# Patient Record
Sex: Female | Born: 1945 | Race: Black or African American | Hispanic: No | Marital: Single | Smoking: Former smoker
Health system: Southern US, Community
[De-identification: ages and names within clinical notes are randomized; demographics above are authoritative.]

## PROBLEM LIST (undated history)

## (undated) DIAGNOSIS — I517 Cardiomegaly: Secondary | ICD-10-CM

## (undated) DIAGNOSIS — I359 Nonrheumatic aortic valve disorder, unspecified: Secondary | ICD-10-CM

## (undated) DIAGNOSIS — I2510 Atherosclerotic heart disease of native coronary artery without angina pectoris: Secondary | ICD-10-CM

## (undated) DIAGNOSIS — K222 Esophageal obstruction: Secondary | ICD-10-CM

## (undated) DIAGNOSIS — IMO0001 Reserved for inherently not codable concepts without codable children: Secondary | ICD-10-CM

## (undated) DIAGNOSIS — I509 Heart failure, unspecified: Secondary | ICD-10-CM

## (undated) DIAGNOSIS — M1990 Unspecified osteoarthritis, unspecified site: Secondary | ICD-10-CM

## (undated) DIAGNOSIS — J309 Allergic rhinitis, unspecified: Secondary | ICD-10-CM

## (undated) DIAGNOSIS — K279 Peptic ulcer, site unspecified, unspecified as acute or chronic, without hemorrhage or perforation: Secondary | ICD-10-CM

## (undated) DIAGNOSIS — J45909 Unspecified asthma, uncomplicated: Secondary | ICD-10-CM

## (undated) DIAGNOSIS — I10 Essential (primary) hypertension: Secondary | ICD-10-CM

## (undated) DIAGNOSIS — R12 Heartburn: Secondary | ICD-10-CM

## (undated) DIAGNOSIS — E785 Hyperlipidemia, unspecified: Secondary | ICD-10-CM

## (undated) DIAGNOSIS — K5730 Diverticulosis of large intestine without perforation or abscess without bleeding: Secondary | ICD-10-CM

## (undated) HISTORY — DX: Peptic ulcer, site unspecified, unspecified as acute or chronic, without hemorrhage or perforation: K27.9

## (undated) HISTORY — DX: Atherosclerotic heart disease of native coronary artery without angina pectoris: I25.10

## (undated) HISTORY — DX: Cardiomegaly: I51.7

## (undated) HISTORY — PX: ESOPHAGOGASTRODUODENOSCOPY: SHX1529

## (undated) HISTORY — DX: Essential (primary) hypertension: I10

## (undated) HISTORY — DX: Heartburn: R12

## (undated) HISTORY — DX: Heart failure, unspecified: I50.9

## (undated) HISTORY — PX: ABDOMINAL HYSTERECTOMY: SHX81

## (undated) HISTORY — DX: Hyperlipidemia, unspecified: E78.5

## (undated) HISTORY — PX: CARDIAC CATHETERIZATION: SHX172

## (undated) HISTORY — DX: Unspecified osteoarthritis, unspecified site: M19.90

## (undated) HISTORY — DX: Diverticulosis of large intestine without perforation or abscess without bleeding: K57.30

## (undated) HISTORY — DX: Esophageal obstruction: K22.2

## (undated) HISTORY — DX: Unspecified asthma, uncomplicated: J45.909

## (undated) HISTORY — DX: Allergic rhinitis, unspecified: J30.9

## (undated) HISTORY — DX: Nonrheumatic aortic valve disorder, unspecified: I35.9

## (undated) HISTORY — DX: Reserved for inherently not codable concepts without codable children: IMO0001

## (undated) HISTORY — PX: ENDOTRACHEAL INTUBATION EMERGENT: SUR448

---

## 1998-03-14 ENCOUNTER — Ambulatory Visit (HOSPITAL_COMMUNITY): Admission: RE | Admit: 1998-03-14 | Discharge: 1998-03-14 | Payer: Self-pay | Admitting: Family Medicine

## 2000-07-22 ENCOUNTER — Encounter: Admission: RE | Admit: 2000-07-22 | Discharge: 2000-07-22 | Payer: Self-pay | Admitting: Family Medicine

## 2000-08-13 ENCOUNTER — Encounter: Admission: RE | Admit: 2000-08-13 | Discharge: 2000-08-13 | Payer: Self-pay | Admitting: Family Medicine

## 2000-09-09 ENCOUNTER — Encounter (INDEPENDENT_AMBULATORY_CARE_PROVIDER_SITE_OTHER): Payer: Self-pay | Admitting: *Deleted

## 2000-09-09 LAB — CONVERTED CEMR LAB

## 2000-09-24 ENCOUNTER — Encounter: Admission: RE | Admit: 2000-09-24 | Discharge: 2000-09-24 | Payer: Self-pay | Admitting: Family Medicine

## 2001-12-15 ENCOUNTER — Encounter: Admission: RE | Admit: 2001-12-15 | Discharge: 2001-12-15 | Payer: Self-pay | Admitting: Sports Medicine

## 2002-01-22 ENCOUNTER — Encounter: Admission: RE | Admit: 2002-01-22 | Discharge: 2002-01-22 | Payer: Self-pay | Admitting: Family Medicine

## 2002-08-28 ENCOUNTER — Emergency Department (HOSPITAL_COMMUNITY): Admission: EM | Admit: 2002-08-28 | Discharge: 2002-08-28 | Payer: Self-pay | Admitting: Emergency Medicine

## 2003-02-23 ENCOUNTER — Encounter: Admission: RE | Admit: 2003-02-23 | Discharge: 2003-02-23 | Payer: Self-pay | Admitting: Sports Medicine

## 2004-03-13 ENCOUNTER — Ambulatory Visit: Payer: Self-pay | Admitting: Family Medicine

## 2004-04-06 ENCOUNTER — Emergency Department (HOSPITAL_COMMUNITY): Admission: EM | Admit: 2004-04-06 | Discharge: 2004-04-06 | Payer: Self-pay | Admitting: Emergency Medicine

## 2005-03-21 ENCOUNTER — Ambulatory Visit: Payer: Self-pay | Admitting: Family Medicine

## 2006-04-08 ENCOUNTER — Ambulatory Visit: Payer: Self-pay | Admitting: Sports Medicine

## 2006-05-06 ENCOUNTER — Ambulatory Visit: Payer: Self-pay | Admitting: Family Medicine

## 2006-08-08 DIAGNOSIS — I10 Essential (primary) hypertension: Secondary | ICD-10-CM

## 2006-08-08 DIAGNOSIS — M1990 Unspecified osteoarthritis, unspecified site: Secondary | ICD-10-CM | POA: Insufficient documentation

## 2006-08-08 DIAGNOSIS — K5730 Diverticulosis of large intestine without perforation or abscess without bleeding: Secondary | ICD-10-CM

## 2006-08-08 DIAGNOSIS — J309 Allergic rhinitis, unspecified: Secondary | ICD-10-CM

## 2006-08-08 DIAGNOSIS — J45909 Unspecified asthma, uncomplicated: Secondary | ICD-10-CM

## 2006-08-08 HISTORY — DX: Allergic rhinitis, unspecified: J30.9

## 2006-08-08 HISTORY — DX: Unspecified asthma, uncomplicated: J45.909

## 2006-08-08 HISTORY — DX: Diverticulosis of large intestine without perforation or abscess without bleeding: K57.30

## 2006-08-08 HISTORY — DX: Essential (primary) hypertension: I10

## 2006-08-09 ENCOUNTER — Encounter (INDEPENDENT_AMBULATORY_CARE_PROVIDER_SITE_OTHER): Payer: Self-pay | Admitting: *Deleted

## 2007-01-28 ENCOUNTER — Telehealth: Payer: Self-pay | Admitting: *Deleted

## 2007-01-28 ENCOUNTER — Emergency Department (HOSPITAL_COMMUNITY): Admission: EM | Admit: 2007-01-28 | Discharge: 2007-01-28 | Payer: Self-pay | Admitting: Emergency Medicine

## 2007-01-29 ENCOUNTER — Encounter (INDEPENDENT_AMBULATORY_CARE_PROVIDER_SITE_OTHER): Payer: Self-pay | Admitting: Family Medicine

## 2007-01-29 ENCOUNTER — Ambulatory Visit: Payer: Self-pay | Admitting: Family Medicine

## 2007-01-29 DIAGNOSIS — R195 Other fecal abnormalities: Secondary | ICD-10-CM | POA: Insufficient documentation

## 2007-01-29 DIAGNOSIS — IMO0001 Reserved for inherently not codable concepts without codable children: Secondary | ICD-10-CM | POA: Insufficient documentation

## 2007-01-29 DIAGNOSIS — R109 Unspecified abdominal pain: Secondary | ICD-10-CM | POA: Insufficient documentation

## 2007-01-29 DIAGNOSIS — I517 Cardiomegaly: Secondary | ICD-10-CM

## 2007-01-29 HISTORY — DX: Reserved for inherently not codable concepts without codable children: IMO0001

## 2007-01-29 HISTORY — DX: Cardiomegaly: I51.7

## 2007-01-30 ENCOUNTER — Ambulatory Visit: Payer: Self-pay | Admitting: Family Medicine

## 2007-01-30 ENCOUNTER — Encounter (INDEPENDENT_AMBULATORY_CARE_PROVIDER_SITE_OTHER): Payer: Self-pay | Admitting: Family Medicine

## 2007-01-30 LAB — CONVERTED CEMR LAB
ALT: 34 units/L (ref 0–35)
AST: 26 units/L (ref 0–37)
Albumin: 4 g/dL (ref 3.5–5.2)
Alkaline Phosphatase: 63 units/L (ref 39–117)
BUN: 19 mg/dL (ref 6–23)
Basophils Absolute: 0.1 10*3/uL (ref 0.0–0.1)
Basophils Relative: 1 % (ref 0–1)
CO2: 26 meq/L (ref 19–32)
Calcium: 9.3 mg/dL (ref 8.4–10.5)
Chloride: 98 meq/L (ref 96–112)
Creatinine, Ser: 0.94 mg/dL (ref 0.40–1.20)
Eosinophils Absolute: 0.1 10*3/uL (ref 0.0–0.7)
Eosinophils Relative: 1 % (ref 0–5)
Glucose, Bld: 94 mg/dL (ref 70–99)
HCT: 38.9 % (ref 36.0–46.0)
Hemoglobin: 12.4 g/dL (ref 12.0–15.0)
Lymphocytes Relative: 21 % (ref 12–46)
Lymphs Abs: 1.7 10*3/uL (ref 0.7–3.3)
MCHC: 31.9 g/dL (ref 30.0–36.0)
MCV: 97.7 fL (ref 78.0–100.0)
Monocytes Absolute: 1 10*3/uL — ABNORMAL HIGH (ref 0.2–0.7)
Monocytes Relative: 12 % — ABNORMAL HIGH (ref 3–11)
Neutro Abs: 5.2 10*3/uL (ref 1.7–7.7)
Neutrophils Relative %: 65 % (ref 43–77)
Platelets: 423 10*3/uL — ABNORMAL HIGH (ref 150–400)
Potassium: 4.4 meq/L (ref 3.5–5.3)
RBC: 3.98 M/uL (ref 3.87–5.11)
RDW: 13.4 % (ref 11.5–14.0)
Sed Rate: 83 mm/hr — ABNORMAL HIGH (ref 0–22)
Sodium: 138 meq/L (ref 135–145)
Total Bilirubin: 0.3 mg/dL (ref 0.3–1.2)
Total CK: 93 units/L (ref 7–177)
Total Protein: 7.2 g/dL (ref 6.0–8.3)
WBC: 7.9 10*3/uL (ref 4.0–10.5)

## 2007-02-05 ENCOUNTER — Ambulatory Visit (HOSPITAL_COMMUNITY): Admission: RE | Admit: 2007-02-05 | Discharge: 2007-02-05 | Payer: Self-pay | Admitting: Sports Medicine

## 2007-02-05 ENCOUNTER — Ambulatory Visit: Payer: Self-pay | Admitting: Internal Medicine

## 2007-02-05 ENCOUNTER — Encounter: Payer: Self-pay | Admitting: Sports Medicine

## 2007-02-06 ENCOUNTER — Encounter (INDEPENDENT_AMBULATORY_CARE_PROVIDER_SITE_OTHER): Payer: Self-pay | Admitting: Family Medicine

## 2007-02-11 ENCOUNTER — Ambulatory Visit: Payer: Self-pay | Admitting: Family Medicine

## 2007-02-13 ENCOUNTER — Encounter (INDEPENDENT_AMBULATORY_CARE_PROVIDER_SITE_OTHER): Payer: Self-pay | Admitting: Family Medicine

## 2007-05-07 LAB — HM MAMMOGRAPHY: HM Mammogram: NORMAL

## 2007-05-15 ENCOUNTER — Encounter (INDEPENDENT_AMBULATORY_CARE_PROVIDER_SITE_OTHER): Payer: Self-pay | Admitting: Family Medicine

## 2008-01-14 ENCOUNTER — Observation Stay (HOSPITAL_COMMUNITY): Admission: EM | Admit: 2008-01-14 | Discharge: 2008-01-16 | Payer: Self-pay | Admitting: Emergency Medicine

## 2008-01-14 ENCOUNTER — Ambulatory Visit: Payer: Self-pay | Admitting: Cardiology

## 2008-01-15 ENCOUNTER — Encounter: Payer: Self-pay | Admitting: Gastroenterology

## 2008-01-16 ENCOUNTER — Encounter: Payer: Self-pay | Admitting: Gastroenterology

## 2008-01-21 ENCOUNTER — Ambulatory Visit: Payer: Self-pay | Admitting: Gastroenterology

## 2008-01-22 ENCOUNTER — Encounter: Payer: Self-pay | Admitting: Gastroenterology

## 2008-02-04 ENCOUNTER — Ambulatory Visit: Payer: Self-pay | Admitting: Cardiology

## 2008-02-18 DIAGNOSIS — E785 Hyperlipidemia, unspecified: Secondary | ICD-10-CM | POA: Insufficient documentation

## 2008-02-18 HISTORY — DX: Hyperlipidemia, unspecified: E78.5

## 2008-02-19 ENCOUNTER — Ambulatory Visit: Payer: Self-pay | Admitting: Gastroenterology

## 2008-02-19 DIAGNOSIS — K222 Esophageal obstruction: Secondary | ICD-10-CM | POA: Insufficient documentation

## 2008-02-19 DIAGNOSIS — R12 Heartburn: Secondary | ICD-10-CM

## 2008-02-19 HISTORY — DX: Heartburn: R12

## 2008-02-19 HISTORY — DX: Esophageal obstruction: K22.2

## 2008-02-23 ENCOUNTER — Ambulatory Visit: Payer: Self-pay | Admitting: Gastroenterology

## 2008-02-23 ENCOUNTER — Encounter: Payer: Self-pay | Admitting: Gastroenterology

## 2008-02-23 LAB — HM COLONOSCOPY

## 2008-02-26 ENCOUNTER — Ambulatory Visit: Payer: Self-pay | Admitting: Cardiology

## 2008-02-26 ENCOUNTER — Encounter: Payer: Self-pay | Admitting: Gastroenterology

## 2008-07-21 ENCOUNTER — Ambulatory Visit: Payer: Self-pay | Admitting: Cardiology

## 2008-07-21 LAB — CONVERTED CEMR LAB
ALT: 16 units/L (ref 0–35)
AST: 19 units/L (ref 0–37)
Albumin: 3.7 g/dL (ref 3.5–5.2)
Alkaline Phosphatase: 51 units/L (ref 39–117)
BUN: 11 mg/dL (ref 6–23)
Bilirubin, Direct: 0.1 mg/dL (ref 0.0–0.3)
CO2: 31 meq/L (ref 19–32)
Calcium: 8.9 mg/dL (ref 8.4–10.5)
Chloride: 106 meq/L (ref 96–112)
Cholesterol: 161 mg/dL (ref 0–200)
Creatinine, Ser: 0.8 mg/dL (ref 0.4–1.2)
GFR calc Af Amer: 94 mL/min
GFR calc non Af Amer: 78 mL/min
Glucose, Bld: 99 mg/dL (ref 70–99)
HDL: 53.9 mg/dL (ref >39.0)
Hgb A1c MFr Bld: 5.8 % (ref 4.6–6.0)
LDL Cholesterol: 94 mg/dL (ref 0–99)
Potassium: 3.8 meq/L (ref 3.5–5.1)
Sodium: 142 meq/L (ref 135–145)
Total Bilirubin: 0.8 mg/dL (ref 0.3–1.2)
Total CHOL/HDL Ratio: 3
Total Protein: 6.6 g/dL (ref 6.0–8.3)
Triglycerides: 66 mg/dL (ref 0–149)
VLDL: 13 mg/dL (ref 0–40)

## 2008-07-26 ENCOUNTER — Ambulatory Visit: Payer: Self-pay | Admitting: Cardiology

## 2008-11-11 ENCOUNTER — Encounter: Admission: RE | Admit: 2008-11-11 | Discharge: 2008-11-11 | Payer: Self-pay | Admitting: Internal Medicine

## 2008-12-21 ENCOUNTER — Ambulatory Visit: Payer: Self-pay | Admitting: Internal Medicine

## 2008-12-21 DIAGNOSIS — M1990 Unspecified osteoarthritis, unspecified site: Secondary | ICD-10-CM | POA: Insufficient documentation

## 2008-12-21 DIAGNOSIS — K279 Peptic ulcer, site unspecified, unspecified as acute or chronic, without hemorrhage or perforation: Secondary | ICD-10-CM

## 2008-12-21 HISTORY — DX: Unspecified osteoarthritis, unspecified site: M19.90

## 2008-12-21 HISTORY — DX: Peptic ulcer, site unspecified, unspecified as acute or chronic, without hemorrhage or perforation: K27.9

## 2009-06-09 ENCOUNTER — Encounter: Payer: Self-pay | Admitting: Internal Medicine

## 2009-06-14 ENCOUNTER — Ambulatory Visit: Payer: Self-pay | Admitting: Internal Medicine

## 2009-08-03 ENCOUNTER — Ambulatory Visit: Payer: Self-pay | Admitting: Cardiology

## 2009-08-03 DIAGNOSIS — I359 Nonrheumatic aortic valve disorder, unspecified: Secondary | ICD-10-CM

## 2009-08-03 HISTORY — DX: Nonrheumatic aortic valve disorder, unspecified: I35.9

## 2009-08-11 ENCOUNTER — Telehealth: Payer: Self-pay | Admitting: Cardiology

## 2009-08-12 ENCOUNTER — Telehealth: Payer: Self-pay | Admitting: Cardiology

## 2009-08-19 ENCOUNTER — Ambulatory Visit (HOSPITAL_COMMUNITY): Admission: RE | Admit: 2009-08-19 | Discharge: 2009-08-19 | Payer: Self-pay | Admitting: Cardiology

## 2009-08-19 ENCOUNTER — Encounter: Payer: Self-pay | Admitting: Cardiology

## 2009-08-19 ENCOUNTER — Ambulatory Visit: Payer: Self-pay | Admitting: Cardiology

## 2009-08-19 ENCOUNTER — Ambulatory Visit: Payer: Self-pay

## 2009-09-01 IMAGING — CT CT ANGIO CHEST
3 of 7 series · 19 of 36 positions shown · IV contrast (APPLIED)
Comparison: None available

CLINICAL DATA: Chest pain

CT ANGIOGRAPHY CHEST
TECHNIQUE: Multidetector CT imaging of the chest using the
standard protocol during bolus administration of intravenous
contrast. Multiplanar reconstructed images obtained and reviewed to
evaluate the vascular anatomy.
Contrast: 80 ml Smnipaque-BOO IV

[Series 5: pulm embolism 3.0 b60f lung · axial · 0.62mm/px · z∈[-240,-130]mm · 5 of 82 slices shown]
[im 8/82  mediastinal]
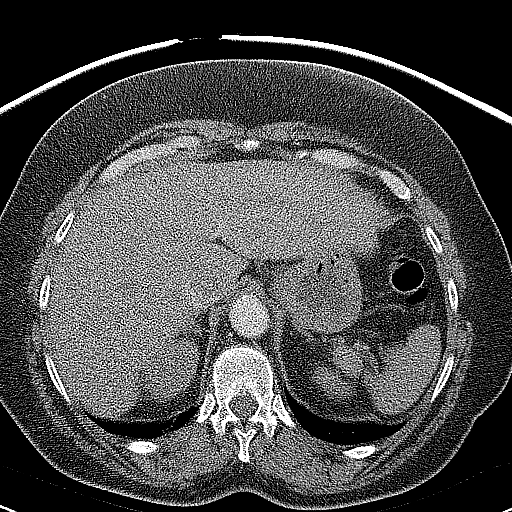
[im 15/82  mediastinal]
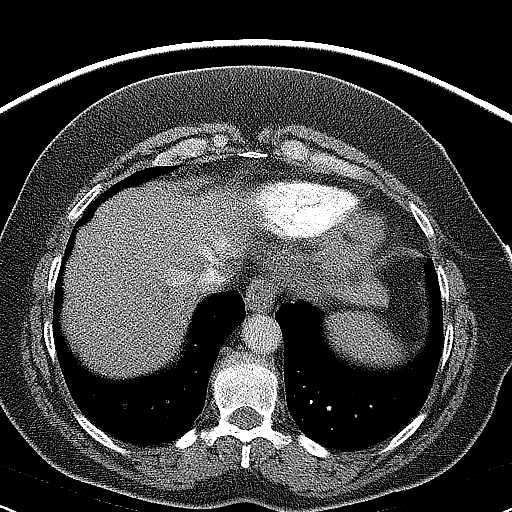
[im 30/82  mediastinal]
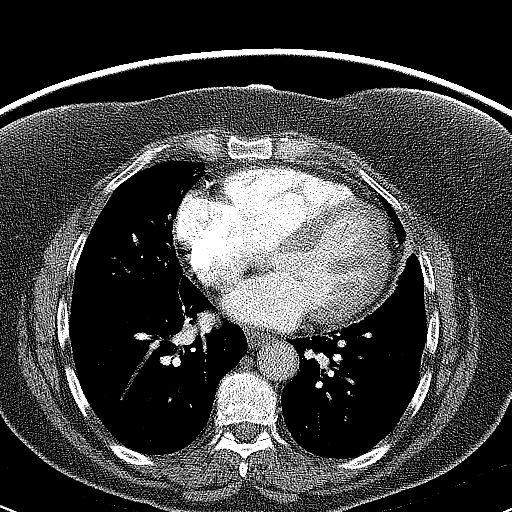
[im 37/82  mediastinal]
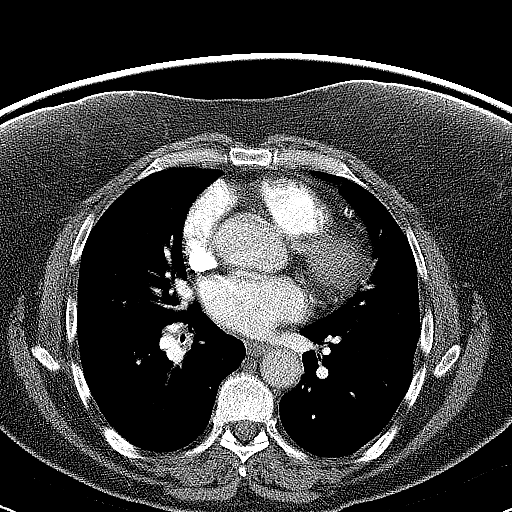
[im 45/82  mediastinal]
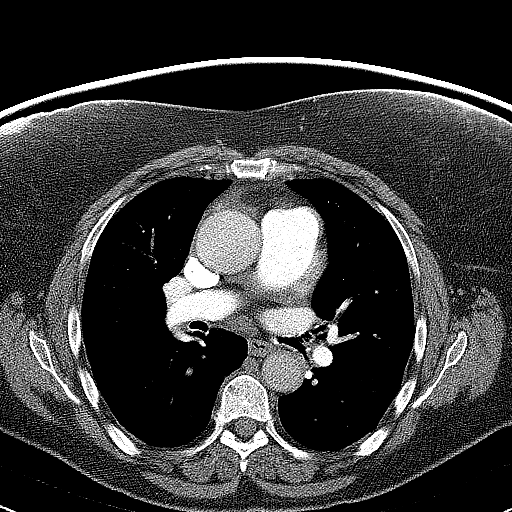

[Series 7: pulm embolism 3.0 b25f thins · axial · 0.62mm/px · z∈[-292,-40]mm · 13 of 99 slices shown]
[im 8/99  lung]
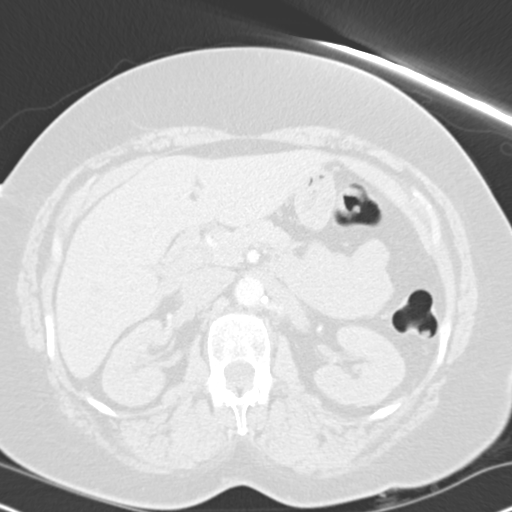
[im 15/99  mediastinal]
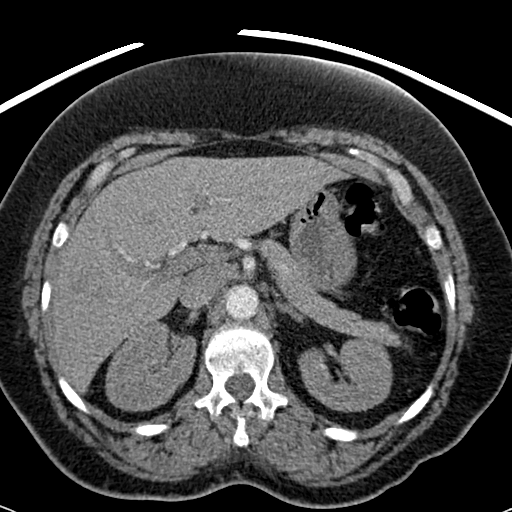
[im 22/99  lung]
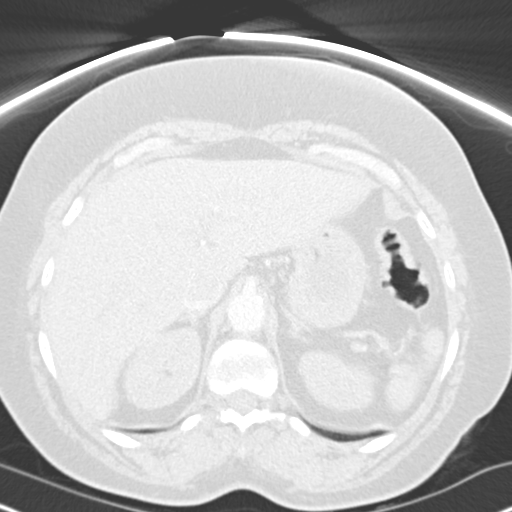
[im 29/99  mediastinal]
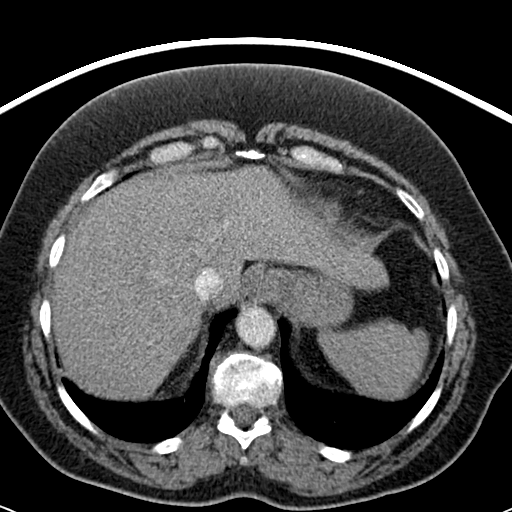
[im 36/99  lung]
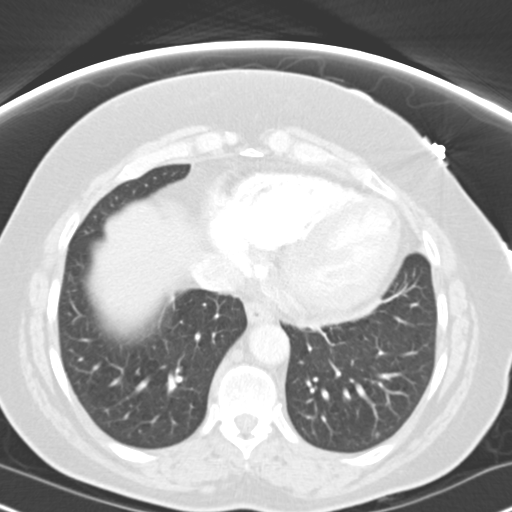
[im 43/99  mediastinal]
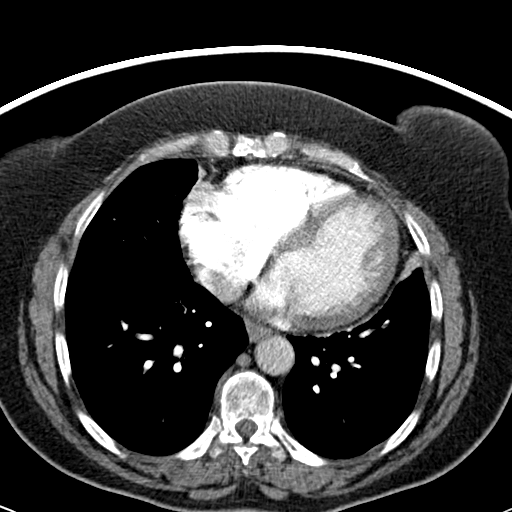
[im 50/99  lung]
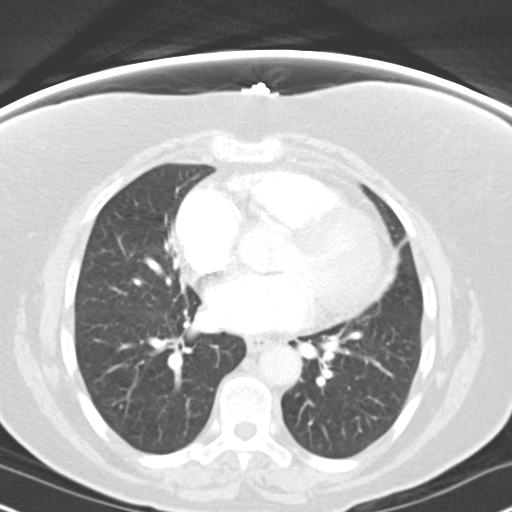
[im 57/99  mediastinal]
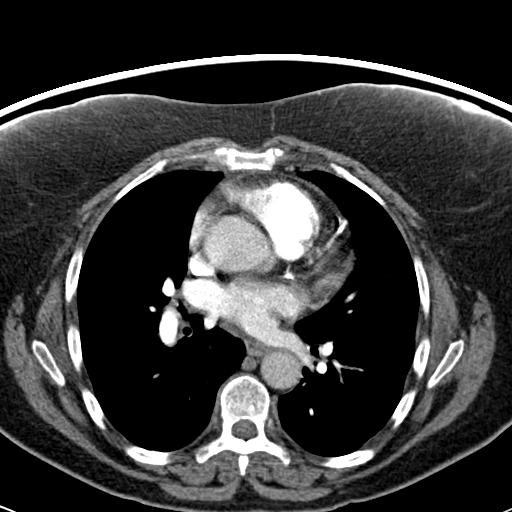
[im 64/99  lung]
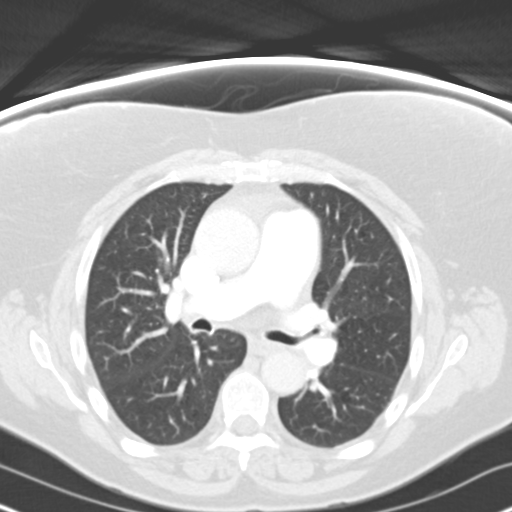
[im 71/99  mediastinal]
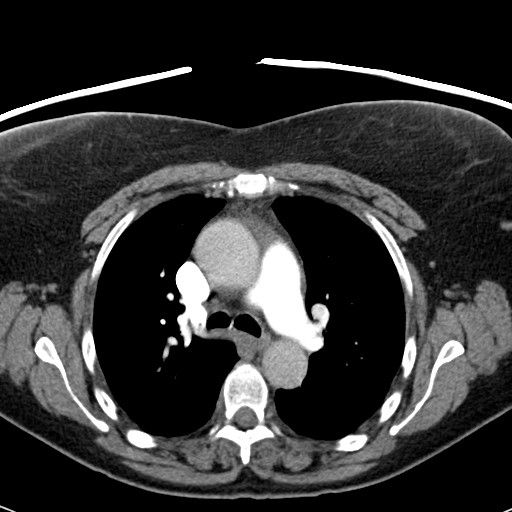
[im 78/99  lung]
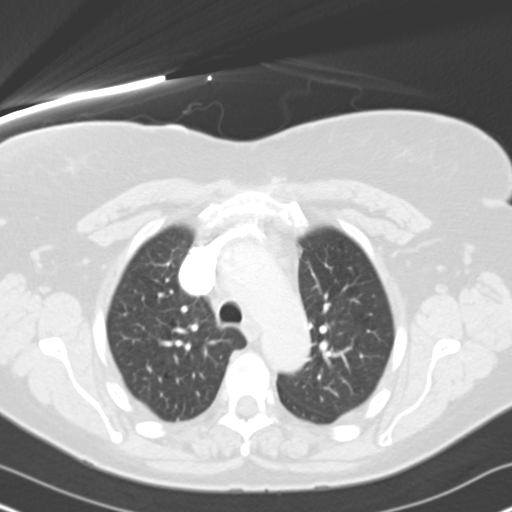
[im 85/99  mediastinal]
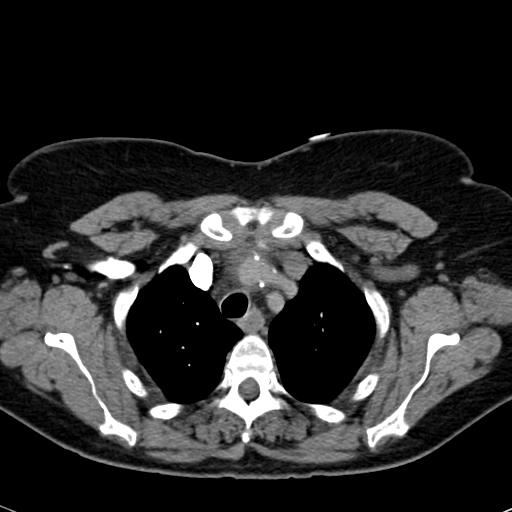
[im 92/99  lung]
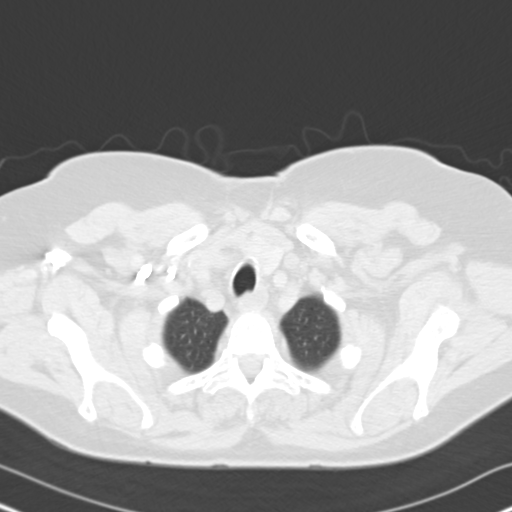

[Series 602: cor angio · coronal · 0.62mm/px · 1 of 101 slices shown]
[im 51/101  mediastinal]
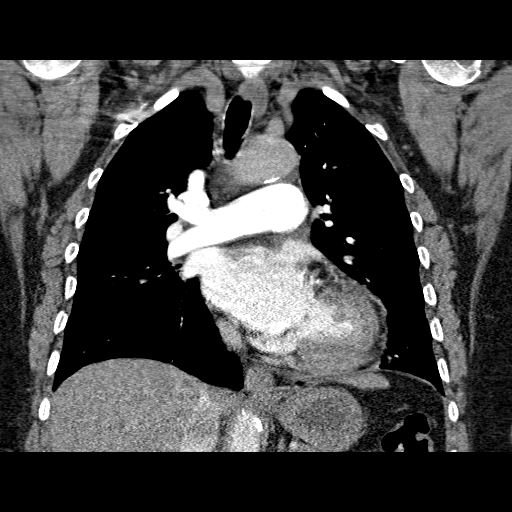

[19 of 36 positions shown; findings below may reference images not displayed]

FINDINGS: There is good contrast opacification of pulmonary artery
branches.  No convincing filling defects to diagnose acute PE.
There is incomplete opacification of thoracic aorta.  Coronary and
aortic calcifications are evident.  No pleural or pericardial
effusion.  No hilar or mediastinal adenopathy.  Visualized upper
abdomen unremarkable.  Lungs are clear.
IMPRESSION: 1.  Negative for acute PE.
2.  Coronary and aortic calcifications.

## 2009-09-16 ENCOUNTER — Telehealth: Payer: Self-pay | Admitting: Cardiology

## 2010-01-17 ENCOUNTER — Telehealth: Payer: Self-pay | Admitting: Internal Medicine

## 2010-02-02 ENCOUNTER — Ambulatory Visit: Payer: Self-pay | Admitting: Internal Medicine

## 2010-02-03 ENCOUNTER — Telehealth: Payer: Self-pay | Admitting: Internal Medicine

## 2010-02-09 ENCOUNTER — Telehealth: Payer: Self-pay | Admitting: Internal Medicine

## 2010-05-11 LAB — HM MAMMOGRAPHY: HM Mammogram: NORMAL

## 2010-06-06 ENCOUNTER — Encounter: Payer: Self-pay | Admitting: Internal Medicine

## 2010-07-09 LAB — CONVERTED CEMR LAB
ALT: 21 units/L (ref 0–35)
AST: 23 units/L (ref 0–37)
Albumin: 3.7 g/dL (ref 3.5–5.2)
Alkaline Phosphatase: 41 units/L (ref 39–117)
Bilirubin, Direct: 0.1 mg/dL (ref 0.0–0.3)
Cholesterol: 159 mg/dL (ref 0–200)
HDL: 66.5 mg/dL (ref >39.00)
LDL Cholesterol: 79 mg/dL (ref 0–99)
Total Bilirubin: 0.6 mg/dL (ref 0.3–1.2)
Total CHOL/HDL Ratio: 2
Total Protein: 7.1 g/dL (ref 6.0–8.3)
Triglycerides: 70 mg/dL (ref 0.0–149.0)
VLDL: 14 mg/dL (ref 0.0–40.0)

## 2010-07-13 ENCOUNTER — Ambulatory Visit (INDEPENDENT_AMBULATORY_CARE_PROVIDER_SITE_OTHER): Payer: Self-pay | Admitting: Cardiology

## 2010-07-13 ENCOUNTER — Ambulatory Visit: Admit: 2010-07-13 | Payer: Self-pay | Admitting: Cardiology

## 2010-07-13 ENCOUNTER — Encounter: Payer: Self-pay | Admitting: Cardiology

## 2010-07-13 DIAGNOSIS — I10 Essential (primary) hypertension: Secondary | ICD-10-CM

## 2010-07-13 DIAGNOSIS — I359 Nonrheumatic aortic valve disorder, unspecified: Secondary | ICD-10-CM

## 2010-07-13 NOTE — Miscellaneous (Signed)
Summary: mammogram-nl  Clinical Lists Changes  Observations: Added new observation of MAMMO DUE: 05/2008 (05/15/2007 13:23) Added new observation of MAMMOGRAM: normal (05/07/2007 13:23)       Preventive Care Screening  Mammogram:    Date:  05/07/2007    Next Due:  05/2008    Results:  normal

## 2010-07-13 NOTE — Progress Notes (Signed)
Summary: LABS TEST RESULTS/ LMOM for CB   Phone Note Call from Patient Call back at Home Phone [phone] Call back at [phone]   Caller: Patient Reason for Call: Lab or Test Results Initial call taken by: Judie Grieve,  August 11, 2009 1:24 PM  Follow-up for Phone Call        [facility] for call back [facility] Katina Dung, RN, BSN  August 12, 2009 10:55 AM  RN Left Message To Call Back. Bernita Raisin, RN, BSN  August 17, 2009 2:50 PM  talked with pt today Follow-up by: Suzan Garibaldi RN

## 2010-07-13 NOTE — Assessment & Plan Note (Signed)
Summary: f1y  Medications Added CALCIUM-VITAMIN D 250-125 MG-UNIT TABS (CALCIUM CARBONATE-VITAMIN D) 1 tab by mouth once daily        Primary Provider:  Dr. Amador Gray  CC:  yearly visit.  History of Present Illness: 65 yo with history of HTN, atypical chest pain, and mild aortic insufficiency returns for followup.  Patient is still working in Public affairs consultant at [employer].  She is doing well in general.  She is active at work.  She gets a little short of breath with climbing a flight of steps but otherwise has no exertional symptoms.  No chest pain.  BP has been running in the 130s to 140s systolic and is 140/79 today.    ECG: NSR, normal  Current Medications (verified): 1)  Benazepril Hcl 20 Mg  Tabs (Benazepril Hcl) .... One Tab By Mouth Qday 2)  Adult Aspirin Ec Low Strength 81 Mg  Tbec (Aspirin) .... One Tab By Mouth Qday 3)  Tramadol Hcl 50 Mg Tabs (Tramadol Hcl) .... Take As Needed 4)  Metoprolol Tartrate 25 Mg Tabs (Metoprolol Tartrate) .Marland Kitchen.. 1 Tab By Mouth Two Times A Day 5)  Zocor 20 Mg Tabs (Simvastatin) .Marland Kitchen.. 1 Tab By Mouth Daily 6)  Amlodipine Besylate 10 Mg Tabs (Amlodipine Besylate) .Marland Kitchen.. 1 Tab By Mouth Daily 7)  Protonix 40 Mg Tbec (Pantoprazole Sodium) .Marland Kitchen.. 1 Once Daily 8)  Calcium-Vitamin D 250-125 Mg-Unit Tabs (Calcium Carbonate-Vitamin D) .Marland Kitchen.. 1 Tab By Mouth Once Daily  Allergies: No Known Drug Allergies  Past History:  Past Medical History: 1. Hypertension. 2. Degenerative joint disease. 3. Echocardiogram, August 2008 which showed mild LVH, EF 65%, mild aortic insufficiency. 4. History of esophageal stricture status post dilatation, August 2009. 5. Peptic ulcer disease. 6. Hypercholesteremia. 7. Atypical chest pain.  Exercise treadmill test in September 2009.  The patient exercised for 7 minutes 30 seconds.   There was no evidence for ischemia by EKG.  8. Asthma 9. Diverticulosis, colon  Family History: Reviewed history from 12/21/2008 and no  changes required. Mother  (78)with DM II.  HTN.  No MI, cancer, lung/GI disease. No FH of Colon Cancer: Father- unknown status  One sister:  died cerebral aneurysm  Social History: Reviewed history from 01/29/2007 and no changes required. Works at [employer].  Never married.  One child.  2 grandchildren, live in [place].  1/2 ppd x 30 yrs.  1-2 beers/ weekend. Quit smoking November 15, 2003.  BF passed away in 11/14/00 .  No reg exercise.  Review of Systems       All systems reviewed and negative except as per HPI.   Vital Signs:  Patient profile:   65 year old female Height:      63 inches Weight:      194 pounds BMI:     34.49 Pulse rate:   55 / minute Resp:     14 per minute BP sitting:   140 / 79  (left arm)  Vitals Entered By: Tanya Gray (August 03, 2009 9:20 AM)  Physical Exam  General:  Well developed, well nourished, in no acute distress. Neck:  Neck supple, JVP 8 cm. No masses, thyromegaly or abnormal cervical nodes. Lungs:  Clear bilaterally to auscultation and percussion. Heart:  Non-displaced PMI, chest non-tender; regular rate and rhythm, S1, S2 without rubs or gallops. 2/6 crescendo-decrescendo murmur RUSB.  Carotid upstroke normal, no bruit. Pedals normal pulses. Trace edema.  Abdomen:  Bowel sounds positive; abdomen soft and non-tender without masses, organomegaly, or  hernias noted. No hepatosplenomegaly. Extremities:  No clubbing or cyanosis. Neurologic:  Alert and oriented x 3. Psych:  Normal affect.   Impression & Recommendations:  Problem # 1:  HYPERTENSION, BENIGN SYSTEMIC (ICD-401.1) BP borderline elevated today.  We will have her record her BP at home and will call her in 2 wks to see what it is running.  Avoid sodium.    Problem # 2:  AORTIC INSUFFICIENCY (ICD-424.1) Aortic-area systolic murmur, no definite diastolic murmur.  Given history of AI on 2008 echo, will repeat echo to assess for progression.  Will need good BP control.    Problem # 3:  HYPERLIPIDEMIA (ICD-272.4) Need fasting lipids/LFTs.   Other Orders: TLB-Lipid Panel (80061-LIPID) TLB-Hepatic/Liver Function Pnl (80076-HEPATIC)  Patient Instructions: 1)  Your physician recommends that you schedule a follow-up appointment in: 1 year with Dr Tanya Gray 2)  Lipids and Liver today--401.9 v58.69 3)  Take and record your blood pressure--I will call you in 2 weeks and  get the readings Tanya Gray [phone]   Appended Document: f1y per Dr Tanya Gray to schedule echocardiogram to follow-up on AI/murmur   Clinical Lists Changes  Orders: Added new Referral order of Echocardiogram (Echo) - Signed

## 2010-07-13 NOTE — Assessment & Plan Note (Signed)
Summary: 6 month fup//ccm   Vital Signs:  Patient profile:   65 year old female Weight:      186 pounds Temp:     98.5 degrees F oral BP sitting:   140 / 90  (left arm) Cuff size:   regular  Vitals Entered By: Kathrynn Speed CMA (February 02, 2010 9:58 AM) CC: 6 mth rov, src Is Patient Diabetic? No   Primary Care Provider:  Dr. Amador Cunas  CC:  6 mth rov and src.  History of Present Illness: 65 year old patient who is seen today for follow-up.  She has a history of mild aortic insufficiency or megaly and hypertension.  She retired.  This past summer and has been out of her medications.  She has been on multiple antihypertensives.  She has a history of dyspepsia, controlled on proton.  No concerns or complaints today.  She has a history of dyslipidemia, controlled on simvastatin..  She continues to tolerate well  Current Medications (verified): 1)  Benazepril Hcl 20 Mg  Tabs (Benazepril Hcl) .... One Tab By Mouth Qday 2)  Adult Aspirin Ec Low Strength 81 Mg  Tbec (Aspirin) .... One Tab By Mouth Qday 3)  Tramadol Hcl 50 Mg Tabs (Tramadol Hcl) .... Take As Needed 4)  Metoprolol Tartrate 25 Mg Tabs (Metoprolol Tartrate) .Marland Kitchen.. 1 Tab By Mouth Two Times A Day 5)  Zocor 20 Mg Tabs (Simvastatin) .Marland Kitchen.. 1 Tab By Mouth Daily 6)  Amlodipine Besylate 10 Mg Tabs (Amlodipine Besylate) .Marland Kitchen.. 1 Tab By Mouth Daily 7)  Protonix 40 Mg Tbec (Pantoprazole Sodium) .Marland Kitchen.. 1 Once Daily 8)  Calcium-Vitamin D 250-125 Mg-Unit Tabs (Calcium Carbonate-Vitamin D) .Marland Kitchen.. 1 Tab By Mouth Once Daily  Allergies (verified): No Known Drug Allergies  Past History:  Past Medical History: Reviewed history from 08/03/2009 and no changes required. 1. Hypertension. 2. Degenerative joint disease. 3. Echocardiogram, August 2008 which showed mild LVH, EF 65%, mild aortic insufficiency. 4. History of esophageal stricture status post dilatation, August 2009. 5. Peptic ulcer disease. 6. Hypercholesteremia. 7. Atypical chest  pain.  Exercise treadmill test in September 2009.  The patient exercised for 7 minutes 30 seconds.   There was no evidence for ischemia by EKG.  8. Asthma 9. Diverticulosis, colon  Family History: Reviewed history from 12/21/2008 and no changes required. Mother  (78)with DM II.  HTN.  No MI, cancer, lung/GI disease. No FH of Colon Cancer: Father- unknown status  One sister:  died cerebral aneurysm  Review of Systems  The patient denies anorexia, fever, weight loss, weight gain, vision loss, decreased hearing, hoarseness, chest pain, syncope, dyspnea on exertion, peripheral edema, prolonged cough, headaches, hemoptysis, abdominal pain, melena, hematochezia, severe indigestion/heartburn, hematuria, incontinence, genital sores, muscle weakness, suspicious skin lesions, transient blindness, difficulty walking, depression, unusual weight change, abnormal bleeding, enlarged lymph nodes, angioedema, and breast masses.    Physical Exam  General:  overweight-appearing.  140/90overweight-appearing.   Head:  Normocephalic and atraumatic without obvious abnormalities. No apparent alopecia or balding. Eyes:  No corneal or conjunctival inflammation noted. EOMI. Perrla. Funduscopic exam benign, without hemorrhages, exudates or papilledema. Vision grossly normal. Mouth:  Oral mucosa and oropharynx without lesions or exudates.  Teeth in good repair. Neck:  No deformities, masses, or tenderness noted. Lungs:  Normal respiratory effort, chest expands symmetrically. Lungs are clear to auscultation, no crackles or wheezes. Heart:  Normal rate and regular rhythm. S1 and S2 normal without gallop, murmur, click, rub or other extra sounds. Abdomen:  Bowel sounds positive,abdomen soft  and non-tender without masses, organomegaly or hernias noted. Msk:  No deformity or scoliosis noted of thoracic or lumbar spine.   Pulses:  R and L carotid,radial,femoral,dorsalis pedis and posterior tibial pulses are full and equal  bilaterally Extremities:  No clubbing, cyanosis, edema, or deformity noted with normal full range of motion of all joints.     Impression & Recommendations:  Problem # 1:  HYPERTENSION, BENIGN SYSTEMIC (ICD-401.1)  The following medications were removed from the medication list:    Amlodipine Besylate 10 Mg Tabs (Amlodipine besylate) .Marland Kitchen... 1 tab by mouth daily Her updated medication list for this problem includes:    Benazepril Hcl 20 Mg Tabs (Benazepril hcl) ..... One tab by mouth qday    Metoprolol Tartrate 25 Mg Tabs (Metoprolol tartrate) .Marland Kitchen... 1 tab by mouth two times a day blood pressure is not doing too poorly off all her medications.  Will discontinue amlodipine.  The following medications were removed from the medication list:    Amlodipine Besylate 10 Mg Tabs (Amlodipine besylate) .Marland Kitchen... 1 tab by mouth daily Her updated medication list for this problem includes:    Benazepril Hcl 20 Mg Tabs (Benazepril hcl) ..... One tab by mouth qday    Metoprolol Tartrate 25 Mg Tabs (Metoprolol tartrate) .Marland Kitchen... 1 tab by mouth two times a day  Problem # 2:  CARDIOMEGALY (ICD-429.3)  Problem # 3:  AORTIC INSUFFICIENCY (ICD-424.1)  Her updated medication list for this problem includes:    Adult Aspirin Ec Low Strength 81 Mg Tbec (Aspirin) ..... One tab by mouth qday    Metoprolol Tartrate 25 Mg Tabs (Metoprolol tartrate) .Marland Kitchen... 1 tab by mouth two times a day  Her updated medication list for this problem includes:    Adult Aspirin Ec Low Strength 81 Mg Tbec (Aspirin) ..... One tab by mouth qday    Metoprolol Tartrate 25 Mg Tabs (Metoprolol tartrate) .Marland Kitchen... 1 tab by mouth two times a day  Problem # 4:  ESOPHAGEAL STRICTURE (ICD-530.3)  Complete Medication List: 1)  Benazepril Hcl 20 Mg Tabs (Benazepril hcl) .... One tab by mouth qday 2)  Adult Aspirin Ec Low Strength 81 Mg Tbec (Aspirin) .... One tab by mouth qday 3)  Tramadol Hcl 50 Mg Tabs (Tramadol hcl) .... Take as needed 4)  Metoprolol  Tartrate 25 Mg Tabs (Metoprolol tartrate) .Marland Kitchen.. 1 tab by mouth two times a day 5)  Calcium-vitamin D 250-125 Mg-unit Tabs (Calcium carbonate-vitamin d) .Marland Kitchen.. 1 tab by mouth once daily 6)  Pantoprazole Sodium 20 Mg Tbec (Pantoprazole sodium) .... One daily 7)  Simvastatin 20 Mg Tabs (Simvastatin) .... One daily  Patient Instructions: 1)  Please schedule a follow-up appointment in 6 months. 2)  Limit your Sodium (Salt) to less than 2 grams a day(slightly less than 1/2 a teaspoon) to prevent fluid retention, swelling, or worsening of symptoms. 3)  It is important that you exercise regularly at least 20 minutes 5 times a week. If you develop chest pain, have severe difficulty breathing, or feel very tired , stop exercising immediately and seek medical attention. 4)  You need to lose weight. Consider a lower calorie diet and regular exercise.  5)  Check your Blood Pressure regularly. If it is above: 140/90 you should make an appointment. Prescriptions: SIMVASTATIN 20 MG TABS (SIMVASTATIN) one daily  #90 x 6   Entered and Authorized by:   Gordy Savers  MD   Signed by:   Gordy Savers  MD on 02/02/2010   Method  used:   Tax adviser to        [facility] (retail)       [phone]       [place], [place]  [address]       Ph: [phone]       Fax: [phone]   RxID:   [id number] PANTOPRAZOLE SODIUM 20 MG TBEC (PANTOPRAZOLE SODIUM) one daily  #90 x 6   Entered and Authorized by:   Gordy Savers  MD   Signed by:   Gordy Savers  MD on 02/02/2010   Method used:   Electronically to        [facility] [address] (retail)       [phone]       [place], [place]  [address]       Ph: [phone]       Fax: [phone]   RxID:   [id number] METOPROLOL TARTRATE 25 MG TABS (METOPROLOL TARTRATE) 1 TAB by mouth two times a day  #180 x 6   Entered and Authorized by:   Gordy Savers   MD   Signed by:   Gordy Savers  MD on 02/02/2010   Method used:   Electronically to        [facility] [address] (retail)       [phone]       [place], [place]  [address]       Ph: [phone]       Fax: [phone]   RxID:   [id number] TRAMADOL HCL 50 MG TABS (TRAMADOL HCL) TAKE AS NEEDED  #90 x 6   Entered and Authorized by:   Gordy Savers  MD   Signed by:   Gordy Savers  MD on 02/02/2010   Method used:   Electronically to        [facility] [address] (retail)       [phone]       [place], [place]  [address]       Ph: [phone]       Fax: [phone]   RxID:   [id number] BENAZEPRIL HCL 20 MG  TABS (BENAZEPRIL HCL) one tab by mouth qday  #90 x 6   Entered and Authorized by:   Gordy Savers  MD   Signed by:   Gordy Savers  MD on 02/02/2010   Method used:   Electronically to        [facility] [address] (retail)       [phone]       [place], [place]  [address]       Ph: [phone]       Fax: [phone]   RxID:   [id number]

## 2010-07-13 NOTE — Progress Notes (Signed)
Summary: question dental work, pre-med   Phone Note Call from Patient Call back at [employer] [phone]   Caller: Patient Reason for Call: Talk to Nurse Summary of Call: pt need dental work, has question regarding pre-med.  Initial call taken by: Lorne Skeens,  September 16, 2009 3:33 PM  Follow-up for Phone Call        Spoke with pt. regarding a need for antibiotics prior having dental work. I let pt. know that according her medical history pt. does not need pre-med prior dental work. Pt. verbalized understanding. Follow-up by: Ollen Gross, RN, BSN,  September 16, 2009 3:57 PM

## 2010-07-13 NOTE — Assessment & Plan Note (Signed)
Summary: 6 month rov/njr   Vital Signs:  Patient profile:   65 year old female Weight:      192 pounds BMI:     34.13 Temp:     98.8 degrees F BP sitting:   150 / 80  (left arm) Cuff size:   regular  Vitals Entered By: Raechel Ache, RN (June 14, 2009 10:10 AM) CC: 6 mo ROV   Primary Care Provider:  Andi Devon MD  CC:  6 mo ROV.  History of Present Illness: 65 -year-old patient who is seen today for follow-up.  She has treated hypertension and dyslipidemia.  She is followed by cardiology and is scheduled for lab and office visit next month.  She has osteoarthritis which has been stable.  She has a history of reflux and esophageal stricture, which has been stable.  She denies any cardiopulmonary complaints  Allergies: No Known Drug Allergies  Past History:  Past Medical History: Reviewed history from 12/21/2008 and no changes required. G2P2, 1 boy died in sleep age 34 mths., h/o combivent/flovent use in past, h/o Etoh abuse per [facility] diatolic dysfunction Arthritis Hyperlipidemia Hypertension atypical chest pain Osteoarthritis esophageal stricture Asthma Diverticulosis, colon Peptic ulcer disease  Review of Systems  The patient denies anorexia, fever, weight loss, weight gain, vision loss, decreased hearing, hoarseness, chest pain, syncope, dyspnea on exertion, peripheral edema, prolonged cough, headaches, hemoptysis, abdominal pain, melena, hematochezia, severe indigestion/heartburn, hematuria, incontinence, genital sores, muscle weakness, suspicious skin lesions, transient blindness, difficulty walking, depression, unusual weight change, abnormal bleeding, enlarged lymph nodes, angioedema, and breast masses.    Physical Exam  General:  overweight-appearing.  130/80overweight-appearing.   Head:  Normocephalic and atraumatic without obvious abnormalities. No apparent alopecia or balding. Eyes:  No corneal or conjunctival inflammation noted. EOMI. Perrla.  Funduscopic exam benign, without hemorrhages, exudates or papilledema. Vision grossly normal. Mouth:  Oral mucosa and oropharynx without lesions or exudates.  Teeth in good repair. Neck:  No deformities, masses, or tenderness noted. Lungs:  Normal respiratory effort, chest expands symmetrically. Lungs are clear to auscultation, no crackles or wheezes. Heart:  Normal rate and regular rhythm. S1 and S2 normal without gallop, murmur, click, rub or other extra sounds. Abdomen:  Bowel sounds positive,abdomen soft and non-tender without masses, organomegaly or hernias noted. Msk:  No deformity or scoliosis noted of thoracic or lumbar spine.   Pulses:  R and L carotid,radial,femoral,dorsalis pedis and posterior tibial pulses are full and equal bilaterally Extremities:  No clubbing, cyanosis, edema, or deformity noted with normal full range of motion of all joints.     Impression & Recommendations:  Problem # 1:  HYPERLIPIDEMIA (ICD-272.4)  Her updated medication list for this problem includes:    Zocor 20 Mg Tabs (Simvastatin) .Marland Kitchen... 1 tab by mouth daily  Her updated medication list for this problem includes:    Zocor 20 Mg Tabs (Simvastatin) .Marland Kitchen... 1 tab by mouth daily  Problem # 2:  HYPERTENSION, BENIGN SYSTEMIC (ICD-401.1)  The following medications were removed from the medication list:    Amlodipine Besylate 10 Mg Tabs (Amlodipine besylate) .Marland Kitchen... 1 once daily Her updated medication list for this problem includes:    Benazepril Hcl 20 Mg Tabs (Benazepril hcl) ..... One tab by mouth qday    Metoprolol Tartrate 25 Mg Tabs (Metoprolol tartrate) .Marland Kitchen... 1 tab by mouth two times a day    Amlodipine Besylate 10 Mg Tabs (Amlodipine besylate) .Marland Kitchen... 1 tab by mouth daily  The following medications were removed  from the medication list:    Amlodipine Besylate 10 Mg Tabs (Amlodipine besylate) .Marland Kitchen... 1 once daily Her updated medication list for this problem includes:    Benazepril Hcl 20 Mg Tabs  (Benazepril hcl) ..... One tab by mouth qday    Metoprolol Tartrate 25 Mg Tabs (Metoprolol tartrate) .Marland Kitchen... 1 tab by mouth two times a day    Amlodipine Besylate 10 Mg Tabs (Amlodipine besylate) .Marland Kitchen... 1 tab by mouth daily  Complete Medication List: 1)  Benazepril Hcl 20 Mg Tabs (Benazepril hcl) .... One tab by mouth qday 2)  Adult Aspirin Ec Low Strength 81 Mg Tbec (Aspirin) .... One tab by mouth qday 3)  Tramadol Hcl 50 Mg Tabs (Tramadol hcl) .... Take as needed 4)  Metoprolol Tartrate 25 Mg Tabs (Metoprolol tartrate) .Marland Kitchen.. 1 tab by mouth two times a day 5)  Zocor 20 Mg Tabs (Simvastatin) .Marland Kitchen.. 1 tab by mouth daily 6)  Amlodipine Besylate 10 Mg Tabs (Amlodipine besylate) .Marland Kitchen.. 1 tab by mouth daily 7)  Protonix 40 Mg Tbec (Pantoprazole sodium) .Marland Kitchen.. 1 once daily  Patient Instructions: 1)  Please schedule a follow-up appointment in 6 months. 2)  Limit your Sodium (Salt) to less than 2 grams a day(slightly less than 1/2 a teaspoon) to prevent fluid retention, swelling, or worsening of symptoms. 3)  It is important that you exercise regularly at least 20 minutes 5 times a week. If you develop chest pain, have severe difficulty breathing, or feel very tired , stop exercising immediately and seek medical attention. 4)  Take calcium +Vitamin D daily. 5)  Check your Blood Pressure regularly. If it is above: 150/90  you should make an appointment.

## 2010-07-13 NOTE — Procedures (Signed)
Summary: Colonoscopy   Colonoscopy  Procedure date:  02/23/2008  Findings:      Location:  [facility].    Procedures Next Due Date:    Colonoscopy: 02/2013  Patient Name: Tanya Gray, Tanya Gray MRN:  Procedure Procedures: Colonoscopy CPT: 11914.    with Hot Biopsy(s)CPT: Z451292.    with polypectomy. CPT: A3573898.  Personnel: Endoscopist: Barbette Hair. Arlyce Dice, MD.  Exam Location: Outpatient  Patient Consent: Procedure, Alternatives, Risks and Benefits discussed, consent obtained, from patient.  Indications  Average Risk Screening Routine.  History  Current Medications: Patient is on an anticoagulant. Patient is not currently taking Coumadin. Anticoagulants: Heparin  Pre-Exam Physical: Performed Feb 23, 2008. Cardio-pulmonary exam, HEENT exam , Abdominal exam, Mental status exam WNL.  Comments: Patient history reviewed/updated, physical performed prior to initiation of sedation?yes Exam Exam: Extent of exam reached: Ileum, extent intended: Cecum.  The cecum was identified by appendiceal orifice and IC valve. Time to Cecum: 00:06: 46. Time for Withdrawl: 00:09:10. Colon retroflexion performed. ASA Classification: II. Tolerance: good.  Monitoring: Pulse and BP monitoring, Oximetry used. Supplemental O2 given. at 2 Liters.  Colon Prep Used Moviprep for colon prep. Prep results: excellent.  Sedation Meds: Patient assessed and found to be appropriate for moderate (conscious) sedation. Sedation was managed by the Endoscopist. Fentanyl 50 mcg. given IV. Versed 6 mg. given IV.  Findings POLYP: Splenic Flexure, Maximum size: 4 mm. sessile polyp. Procedure:  snare with cautery, Polyp sent to pathology. ICD9: Colon Polyps: 211.3.  - NORMAL EXAM: Cecum to Splenic Flexure.  NORMAL EXAM: Cecum.  POLYP: Sigmoid Colon, Maximum size: 2 mm. sessile polyp. Distance from Anus 20 cm. Procedure:  hot biopsy, sent to pathology. ICD9: Colon Polyps: 211.3.  -  DIVERTICULOSIS: Sigmoid Colon. ICD9: Diverticulosis: 562.10. Comments: Few diverticula.  POLYP: Sigmoid Colon, Maximum size: 5 mm. sessile polyp. Distance from Anus 12 cm. Procedure:  snare with cautery, sent to pathology. ICD9: Colon Polyps: 211.3.  NORMAL EXAM: Rectum.   Assessment Abnormal examination, see findings above.  Diagnoses: 562.10: Diverticulosis.  211.3: Colon Polyps.   Events  Unplanned Interventions: No intervention was required.  Unplanned Events: There were no complications. Plans  Post Exam Instructions: Post sedation instructions given.  Patient Education: Patient given standard instructions for: Polyps. Diverticulosis.  Disposition: After procedure patient sent to recovery. After recovery patient sent home.  Scheduling/Referral: Colonoscopy, to Barbette Hair. Arlyce Dice, MD, if adenoma, around Feb 22, 2013. This report was created from the original endoscopy report, which was reviewed and signed by the above listed endoscopist.    REPORT OF SURGICAL PATHOLOGY   Case #: [id number] Patient Name: Tanya Gray Office Chart Number:  [id number]   MRN: [id number] Pathologist: Alden Server A. Delila Spence, MD DOB/Age  05/15/1946 (Age: 65)    Gender: F Date Taken:  02/23/2008 Date Received: 02/24/2008   FINAL DIAGNOSIS   ***MICROSCOPIC EXAMINATION AND DIAGNOSIS***   1. COLON, TRANSVERSE, POLYP:   -  ONE ADENOMATOUS POLYP AND ONE HYPERPLASTIC POLYP. -  NO HIGH GRADE DYSPLASIA OR INVASIVE MALIGNANCY IDENTIFIED.   2. RECTUM, POLYP: -  POLYPOID RECTAL MUCOSA WITH UNDERLYING LEIOMYOMA. -  NO ADENOMATOUS CHANGE OR MALIGNANCY IDENTIFIED.   jy Date Reported:  02/25/2008     Alden Server A. Delila Spence, MD *** Electronically Signed Out By EAA ***     February 26, 2008 MRN: [id number]  [facility] [address] [place], [place]  [address]    Dear Ms. Whittenberg,  I am pleased to inform you that the colon polyp(s) removed  during your recent colonoscopy was (were) found  to be benign (no cancer detected) upon pathologic examination.  I recommend you have a repeat colonoscopy examination in _5 years to look for recurrent polyps, as having colon polyps increases your risk for having recurrent polyps or even colon cancer in the future.  Should you develop new or worsening symptoms of abdominal pain, bowel habit changes or bleeding from the rectum or bowels, please schedule an evaluation with either your primary care physician or with me.  Additional information/recommendations:  __ No further action with gastroenterology is needed at this time. Please      follow-up with your primary care physician for your other healthcare      needs.  __ Please call [phone] to schedule a return visit to review your      situation.  __ Please keep your follow-up visit as already scheduled.  _x_ Continue treatment plan as outlined the day of your exam.  Please call us if you are having persistent problems or have questions about your condition that have not been fully answered at this time.  Sincerely,  Louis Meckel MD  This letter has been electronically signed by your physician.   Signed by Louis Meckel MD on 02/26/2008 at 2:36 PM  ________________________________________________________________________   cc:  Andi Devon, MD

## 2010-07-13 NOTE — Assessment & Plan Note (Signed)
Summary: F-UP HOSP-ENDO/FH   History of Present Illness Visit Type: new patient Primary GI MD: Melvia Heaps MD [facility] Primary Provider: Andi Devon MD Chief Complaint: hospital f/u Endo no current GI problems History of Present Illness:   Ms. Chakraborty comes in today for a hospital follow-up. In August she was hospitalized with chest pain.  She underwent an EGD for chest pain as well as dysphagia.  Some antral ulcers were seen, one of which had a pigmented base but no active bleeding.  Biopsies were compatible with reactive gastropathy. No H. Pylori organisms were seen.  The patient also had a distal esophageal stricture which was dilated.  She is currently on Protonix and hasn't had any further chest pain or dysphagia. No GI complaints including diarrhea, constipation or hematochezia.  Her last colonoscopy was apparently 10 years ago (per patient).   GI Review of Systems      Denies abdominal pain, acid reflux, belching, bloating, chest pain, dysphagia with liquids, dysphagia with solids, heartburn, loss of appetite, nausea, vomiting, vomiting blood, weight loss, and  weight gain.        Denies anal fissure, black tarry stools, change in bowel habit, constipation, diarrhea, diverticulosis, fecal incontinence, heme positive stool, hemorrhoids, irritable bowel syndrome, jaundice, light color stool, liver problems, rectal bleeding, and  rectal pain.     Prior Medications Reviewed Using: Patient Recall  Updated Prior Medication List: BENAZEPRIL HCL 20 MG  TABS (BENAZEPRIL HCL) one tab by mouth qday AMLODIPINE BESYLATE 10 MG TABS (AMLODIPINE BESYLATE) 1 once daily ADULT ASPIRIN EC LOW STRENGTH 81 MG  TBEC (ASPIRIN) one tab by mouth qday TRAMADOL HCL 50 MG TABS (TRAMADOL HCL) TAKE AS NEEDED METOPROLOL TARTRATE 25 MG TABS (METOPROLOL TARTRATE) 1 TAB by mouth two times a day ZOCOR 20 MG TABS (SIMVASTATIN) 1 TAB by mouth DAILY AMLODIPINE BESYLATE 10 MG TABS (AMLODIPINE BESYLATE) 1 TAB by  mouth DAILY PROTONIX 40 MG TBEC (PANTOPRAZOLE SODIUM) 1 once daily  Current Allergies (reviewed today): No known allergies   Past Medical History:    G2P2, 1 boy died in sleep age 65 mths., h/o combivent/flovent use in past, h/o Etoh abuse per [facility]    diatolic dysfunction    Arthritis    Hyperlipidemia    Hypertension  Past Surgical History:    Reviewed history from 08/08/2006 and no changes required:       hysterectom, age 35, 2/2 irreg bleeding - 07/30/2000   Family History:    Mother with DM II.  HTN.  No MI, cancer, lung/GI disease.    No FH of Colon Cancer:  Social History:    Reviewed history from 01/29/2007 and no changes required:       Works at [employer], [employer].  Never married.  One child.  2 grandchildren, live in [place].  1/2 ppd x 30 yrs.  1-2 beers/ weekend. Quit smoking 2003-11-11.  BF passed away in 2000/11/10 .  No reg exercise.    Review of Systems  The patient denies allergy/sinus, anemia, anxiety-new, arthritis/joint pain, back pain, blood in urine, breast changes/lumps, change in vision, confusion, cough, coughing up blood, depression-new, fainting, fatigue, fever, headaches-new, hearing problems, heart murmur, heart rhythm changes, itching, menstrual pain, muscle pains/cramps, night sweats, nosebleeds, pregnancy symptoms, shortness of breath, skin rash, sleeping problems, sore throat, swelling of feet/legs, swollen lymph glands, thirst - excessive , urination - excessive , urination changes/pain, urine leakage, vision changes, and voice change.     Vital Signs:  Patient Profile:  65 Years Old Female Height:     63 inches Weight:      187.50 pounds BMI:     33.33 Pulse rate:   66 / minute Pulse rhythm:   regular BP sitting:   118 / 70  (left arm)  Vitals Entered By: Chales Abrahams CMA (February 19, 2008 8:24 AM)                  Physical Exam  General:     Well developed, well nourished, no acute distress. Head:     Normocephalic  and atraumatic. Mouth:     No deformity or lesions, dentition normal. Neck:     Supple; no masses or thyromegaly. Lungs:     Clear throughout to auscultation. Heart:     Regular rate and rhythm; no murmurs, rubs,  or bruits. Abdomen:     Soft, nontender, nondistended. Normal BS. No hepatomegaly. Extremities:     trace bilateral lower extremity edema. Neurologic:     Alert and  oriented x4;  grossly normal neurologically. Skin:     Intact without significant lesions or rashes. Psych:     Alert and cooperative. Normal mood and affect.    Impression & Recommendations:  Problem # 1:  ESOPHAGEAL STRICTURE (ICD-530.3) S/P dilation August 2009. No further dysphagia. Continue Protonix  Problem # 2:  HEARTBURN (ICD-787.1) Resolved on Protonix. No esophagitis on recent EGD. Continue Protonix 40mg  once daily. Patient does have an appt. for a stress test on the 17th of this month to rule out cardiac disease.  Problem # 3:  SCREENING COLORECTAL-CANCER (ICD-V76.51) Per patient, her last colonoscopy was 10 years ago. Will schedule colonoscopy for routine screening.  Other Orders: Colonoscopy (Colon)   Patient Instructions: 1)  colonoscopy in [facility] 02/23/2008 2)  continue Protonix 3)  Movie prep sent to your pharmacy 4)  [facility] Patient Information Guide given to patient.    Prescriptions: MOVIPREP 100 GM  SOLR (PEG-KCL-NACL-NASULF-NA ASC-C) As per prep instructions.  #1 x 0   Entered by:   Merri Ray CMA   Authorized by:   Louis Meckel MD   Signed by:   Merri Ray CMA on 02/19/2008   Method used:   Print then Give to Patient   RxID:   [id number]  ]

## 2010-07-13 NOTE — Miscellaneous (Signed)
   Clinical Lists Changes  Observations: Added new observation of ECHOFINDING: diastolic dysfunction with EF 65% otherwise normalEjection Fraction:  >=50%.  Location:  [facility].    (02/06/2007 11:21)       Echocardiogram  Procedure date:  02/06/2007  Findings:      diastolic dysfunction with EF 65% otherwise normalEjection Fraction:  >=50%.  Location:  [facility].

## 2010-07-13 NOTE — Progress Notes (Signed)
Summary: need directions on tramadol  Phone Note From Pharmacy   Caller: [facility] Summary of Call: need sig on tramadol - uad not enough.     New/Updated Medications: TRAMADOL HCL 50 MG TABS (TRAMADOL HCL) two times a day prn Prescriptions: TRAMADOL HCL 50 MG TABS (TRAMADOL HCL) two times a day prn  #90 x 6   Entered by:   Duard Brady LPN   Authorized by:   Gordy Savers  MD   Signed by:   Duard Brady LPN on 62/83/1517   Method used:   Electronically to        [facility] [phone]* (retail)       [address]       [place], [place]  [address]       Ph: [phone]       Fax: [phone]   RxID:   [id number]

## 2010-07-13 NOTE — Procedures (Signed)
Summary: Endoscopy   EGD  Procedure date:  01/15/2008  Findings:      Location: [facility]    Patient Name: Tanya Gray, Tanya Gray MRN:  Procedure Procedures: Panendoscopy (EGD) CPT: 43235.    with biopsy(s)/brushing(s). CPT: D1846139.  Personnel: Endoscopist: Barbette Hair. Arlyce Dice, MD.  Indications Symptoms: Dysphagia. Chest Pain.  History  Current Medications: Patient is on an anticoagulant. Patient is not currently taking Coumadin. Anticoagulants: Heparin  Pre-Exam Physical: Performed Jan 15, 2008  Entire physical exam was normal.  Exam Exam Info: Maximum depth of insertion Duodenum, intended Duodenum. Vocal cords visualized. Gastric retroflexion performed. ASA Classification: II. Tolerance: good.  Sedation Meds: Robinul 0.2 given IV. Fentanyl 60 given IV. Versed 6 mg. given IV. Cetacaine Spray 2 sprays given aerosolized.  Monitoring: BP and pulse monitoring done. Oximetry used. Supplemental O2 given at 2 Liters.  Findings - Normal: Proximal Esophagus to Mid Esophagus.  STRICTURE / STENOSIS: Stricture in Distal Esophagus.  Constriction: partial. 40 cm from mouth. ICD9: Esophageal Stricture: 530.3.  - Normal: Duodenal Bulb to Duodenal 2nd Portion.  ULCER: in Antrum Maximum size: 3 mm. Not bleeding, dark spot in ulcer. Biopsy/Ulcer taken. ICD9: Ulcer, Gastric, Acute with Hemorrhage: 532. 00. Comment: 3 discreet superficial antral ulcers, one with pigment at its base.  - Normal: Cardia to Body.  ULCER: in Antrum Not bleeding, dark spot in ulcer. taken. ICD9: Ulcer, Gastric, Acute with Hemorrhage: 532.00.  ULCER: in Antrum Not bleeding, dark spot in ulcer. taken. ICD9: Ulcer, Gastric, Acute with Hemorrhage: 532.00.   Assessment Abnormal examination, see findings above.  Diagnoses: 530.3: Esophageal Stricture.  532.00: Ulcer, Gastric, Acute with Hemorrhage.   Events  Unplanned Intervention: No unplanned interventions were required.  Unplanned Events: There  were no complications. Plans Medication(s): PPI: Pantoprazole/Protonix 40 mg QD, starting Jan 15, 2008   Comments: Stricture was not dilated because pt received 3 doses of subcutaneous heparin Scheduling: EGD, to Molly Maduro D. Arlyce Dice, MD, Balloon dilitation on Jan 16, 2008.    cc: Candyce Churn. Allyne Gee, MD   This report was created from the original endoscopy report, which was reviewed and signed by the above listed endoscopist.

## 2010-07-13 NOTE — Progress Notes (Signed)
Summary: needs office visit   Phone Note From Other Clinic   Caller: Provider Summary of Call: pt seen at [facility] on 8/19 for fatigue, soreness in back and shoulders.  no sob or chest pain.  not feeling like herself.  pt is a Advertising copywriter.  has appt scheduled for 9/19 with pcp, but needing more acute visit.  urine normal  cxr showed cardiac enlargement.  Clydie Braun, [place], wants her to be seen sooner.  please schedule as wi appt for 8/20.  pt to call at 830 but would like a call from triage nurse.  [phone] pt's number.   Initial call taken by: Wilhemina Bonito  MD,  January 28, 2007 8:27 PM  Follow-up for Phone Call        appointment scheduled today at 3:10 with PCP.  Follow-up by: Theresia Lo RN,  January 29, 2007 8:33 AM

## 2010-07-13 NOTE — Assessment & Plan Note (Signed)
Summary: F/U RESULTS OF HEMOCULT CARDS/[facility]  Medications Added BENAZEPRIL HCL 20 MG  TABS (BENAZEPRIL HCL) one tab by mouth qday VERAPAMIL HCL CR 360 MG CP24 (VERAPAMIL HCL) Take 1 capsule by mouth once a day ADULT ASPIRIN EC LOW STRENGTH 81 MG  TBEC (ASPIRIN) one tab by mouth qday        Vital Signs:  Patient Profile:   65 Years Old Female Height:     63 inches Weight:      191 pounds Temp:     97.8 degrees F Pulse rate:   61 / minute BP sitting:   143 / 79  Pt. in pain?   no  Vitals Entered By: Jone Baseman CMA (February 11, 2007 8:20 AM)                  Chief Complaint:  f/u hemocult cards.  History of Present Illness: S: Patient here today to f/u last office visit where she was having fever and body aches.  I got blood cultures because she had a heart murmur and these had no growth.  ECHO showed diastolic dysfunction with EF 65% and otherwise normal.  Patient states she feels much better and has not had any fever or chills.  2. HTN- Patient has not taken BP meds today.  She has bought a monitor for home but has not used it yet.  Patient thinks that she is getting cramping from the HCTZ    Past Medical History:    Reviewed history from 08/08/2006 and no changes required:       G2P2, 1 boy died in sleep age 84 mths., h/o combivent/flovent use in past, h/o Etoh abuse per [facility]       diatolic dysfunction  Past Surgical History:    Reviewed history from 08/08/2006 and no changes required:       hysterectom, age 34, 2/2 irreg bleeding - 07/30/2000   Family History:    Reviewed history from 08/08/2006 and no changes required:       Mother with DM II.  HTN.  No MI, cancer, lung/GI disease.  Social History:    Reviewed history from 01/29/2007 and no changes required:       Works at [employer], [employer].  Never married.  One child.  2 grandchildren, live in [place].  1/2 ppd x 30 yrs.  1-2 beers/ weekend. Quit smoking 11-17-03.  BF passed away in 11/16/00  .  No reg exercise.     Physical Exam  General:     Well-developed,well-nourished,in no acute distress; alert,appropriate and cooperative throughout examination Head:     normocephalic and atraumatic.   Eyes:     vision grossly intact.   Mouth:     Oral mucosa and oropharynx without lesions or exudates.  Teeth in good repair. Lungs:     Normal respiratory effort, chest expands symmetrically. Lungs are clear to auscultation, no crackles or wheezes. Heart:     Normal rate and regular rhythm. S1 and S2 normal, soft II/VI systolic ejection murmor. Extremities:     No clubbing, cyanosis, edema, or deformity noted with normal full range of motion of all joints.      Impression & Recommendations:  Problem # 1:  ABDOMINAL PAIN (ICD-789.00) Assessment: Improved This has resolved.  As noted, the patient had also had fever and myalgias which have resolved as well.  Will recheck ESR today since that was elevated at last visit.  Patient will return stool cards tomorrow  Problem # 2:  HYPERTENSION, BENIGN SYSTEMIC (ICD-401.1) Assessment: Deteriorated Slightly elevated.  Will stop HCTZ and increase Benazepril.   Her updated medication list for this problem includes:    Benazepril Hcl 20 Mg Tabs (Benazepril hcl) ..... One tab by mouth qday    Verapamil Hcl Cr 360 Mg Cp24 (Verapamil hcl) .Marland Kitchen... Take 1 capsule by mouth once a day  Orders: [facility]- Est Level  3 (27253)   Complete Medication List: 1)  Benazepril Hcl 20 Mg Tabs (Benazepril hcl) .... One tab by mouth qday 2)  Verapamil Hcl Cr 360 Mg Cp24 (Verapamil hcl) .... Take 1 capsule by mouth once a day 3)  Adult Aspirin Ec Low Strength 81 Mg Tbec (Aspirin) .... One tab by mouth qday  Other Orders: Sed Rate (ESR)-[facility] [phone])   Patient Instructions: 1)  Stop the combination pill Benazepril/HCTZ 2)  start Benazepril 20 mg by mouth by mouth qday 3)  f/u in 3-4 months 4)  bring in blood pressure reading to next visit     Laboratory  Results   Blood Tests   Date/Time Received: February 11, 2007 8:55 AM  Date/Time Reported: February 11, 2007 10:39 AM   SED rate: 45 mm/hr  Comments: ...................................................................DONNA [facility]  February 11, 2007 10:39 AM

## 2010-07-13 NOTE — Progress Notes (Signed)
Summary: Refill Meds  Phone Note Refill Request Message from:  Pharmacy  Refills Requested: Medication #1:  BENAZEPRIL HCL 20 MG  TABS one tab by mouth qday   Dosage confirmed as above?Dosage Confirmed  Medication #2:  PROTONIX 40 MG TBEC 1 once daily   Dosage confirmed as above?Dosage Confirmed  Medication #3:  AMLODIPINE BESYLATE 10 MG TABS 1 TAB by mouth DAILY   Dosage confirmed as above?Dosage Confirmed  Medication #4:  METOPROLOL TARTRATE 25 MG TABS 1 TAB by mouth two times a day   Dosage confirmed as above?Dosage Confirmed Initial call taken by: Kathrynn Speed CMA,  January 17, 2010 3:48 PM    Prescriptions: PROTONIX 40 MG TBEC (PANTOPRAZOLE SODIUM) 1 once daily  #90 x 6   Entered by:   Kathrynn Speed CMA   Authorized by:   Gordy Savers  MD   Signed by:   Kathrynn Speed CMA on 01/17/2010   Method used:   Faxed to ...       [facility] (retail)       [address] [place]       [address]       [place], [place]  [address]       Ph: [phone]       Fax: [phone]   RxID:   [id number] AMLODIPINE BESYLATE 10 MG TABS (AMLODIPINE BESYLATE) 1 TAB by mouth DAILY  #90 x 6   Entered by:   Kathrynn Speed CMA   Authorized by:   Gordy Savers  MD   Signed by:   Kathrynn Speed CMA on 01/17/2010   Method used:   Faxed to ...       [facility] (retail)       [address] [place]       [address]       [place], [place]  [address]       Ph: [phone]       Fax: [phone]   RxID:   [id number] METOPROLOL TARTRATE 25 MG TABS (METOPROLOL TARTRATE) 1 TAB by mouth two times a day  #180 x 6   Entered by:   Kathrynn Speed CMA   Authorized by:   Gordy Savers  MD   Signed by:   Kathrynn Speed CMA on 01/17/2010   Method used:   Faxed to ...       [facility] (retail)       [address] [place]       [address]       [place], [place]  [address]       Ph: [phone]       Fax: [phone]   RxID:   [id number] BENAZEPRIL HCL 20 MG  TABS (BENAZEPRIL  HCL) one tab by mouth qday  #90 x 6   Entered by:   Kathrynn Speed CMA   Authorized by:   Gordy Savers  MD   Signed by:   Kathrynn Speed CMA on 01/17/2010   Method used:   Faxed to ...       [facility] (retail)       [address] [place]       [address]       [place], [place]  [address]       Ph: [phone]       Fax: [phone]   RxID:   [id number] ZOCOR 20 MG TABS (SIMVASTATIN) 1 TAB by mouth DAILY  #90 x 6   Entered by:   Lamar Laundry  Carraway CMA   Authorized by:   Gordy Savers  MD   Signed by:   Kathrynn Speed CMA on 01/17/2010   Method used:   Faxed to ...       [facility] (retail)       [address] [place]       [address]       [place], [place]  [address]       Ph: [phone]       Fax: [phone]   RxID:   [id number]

## 2010-07-13 NOTE — Progress Notes (Signed)
Summary: B/P readings--review 08-23-09   Phone Note Outgoing Call   Call placed by: Katina Dung, RN, BSN,  August 12, 2009 7:30 AM Call placed to: Patient Summary of Call: B/P readings  Follow-up for Phone Call        B/P borderline high in office 08-03-09--call and get readings LMVM Katina Dung, RN, BSN  August 12, 2009 11:32 AM  RN Left Message To Call Back. Bernita Raisin, RN, BSN  August 17, 2009 2:49 PM   Additional Follow-up for Phone Call Additional follow up Details #1::        08/04/09 140/80  08/05/09 140/80 08/06/09 138/81 08/07/09 138/80 2//28/11 133/70 08/09/09 128/80 08/10/09 128/80 08/11/09 126/80 08/12/09 126/81 08/16/09 130/70 08/17/09 130/70  meds reviewed with patient patient without complaints patient is limiting salt  and watching fat in her diet will forward to Dr Shirlee Latch for review     Appended Document: B/P readings--review 08-23-09 looks good.   Appended Document: B/P readings--review 08-23-09 discussed with pt by telephone

## 2010-07-13 NOTE — Assessment & Plan Note (Signed)
Summary: to be est/njr   Vital Signs:  Patient profile:   65 year old female Weight:      189 pounds BMI:     33.60 Temp:     98.6 degrees F oral Pulse rate:   60 / minute Pulse rhythm:   regular BP sitting:   160 / 86  (left arm) Cuff size:   regular  Vitals Entered By: Raechel Ache, RN (December 21, 2008 1:25 PM)  Primary Care Provider:  Andi Devon MD  CC:  To Establish..  History of Present Illness: 65 year old patient seen today  to establish with our practice.  Chest history treated hypertension, and dyslipidemia.  She has esophageal stricture disease and has required dilatation in the past.  She has been evaluated by cardiology due to atypical chest pain.  She did have a negative exercise treadmill test in September of 2009.  She has peptic ulcer disease.  She is doing quite well and also followed by GI.  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 One Tab By Mouth Qday 2)  Amlodipine Besylate 10 Mg Tabs (Amlodipine Besylate) .Marland Kitchen.. 1 Once Daily 3)  Adult Aspirin Ec Low Strength 81 Mg  Tbec (Aspirin) .... One Tab By Mouth Qday 4)  Tramadol Hcl 50 Mg Tabs (Tramadol Hcl) .... Take As Needed 5)  Metoprolol Tartrate 25 Mg Tabs (Metoprolol Tartrate) .Marland Kitchen.. 1 Tab By Mouth Two Times A Day 6)  Zocor 20 Mg Tabs (Simvastatin) .Marland Kitchen.. 1 Tab By Mouth Daily 7)  Amlodipine  Besylate 10 Mg Tabs (Amlodipine Besylate) .Marland Kitchen.. 1 Tab By Mouth Daily 8)  Protonix 40 Mg Tbec (Pantoprazole Sodium) .Marland Kitchen.. 1 Once Daily  Allergies: No Known Drug Allergies  Past History:  Past Medical History: G2P2, 1 boy died in sleep age 63 mths., h/o combivent/flovent use in past, h/o Etoh abuse per [facility] diatolic dysfunction Arthritis Hyperlipidemia Hypertension atypical chest pain Osteoarthritis esophageal stricture Asthma Diverticulosis, colon Peptic ulcer disease  Past Surgical History: hysterectomy, age 26, 2/2 irreg bleeding - 07/30/2000 gravida two, para two, abortus zero colonoscopy September 2009 EGD 2007/11/10 ETT September 2009 2-D echocardiogram August 2008 (.  Mild LVH; ejection fraction 65%, mild AI)  Family History: Reviewed history from 02/19/2008 and no changes required. Mother  (78)with DM II.  HTN.  No MI, cancer, lung/GI disease. No FH of Colon Cancer: Father- unknown status  One sister:  died cerebral aneurysm  Social History: Reviewed history from 01/29/2007 and no changes required. Works at [employer].  Never married.  One child.  2 grandchildren, live in [place].  1/2 ppd x 30 yrs.  1-2 beers/ weekend. Quit smoking 10-Nov-2003.  BF passed away in November 09, 2000 .  No reg exercise.  Review of Systems  The patient denies anorexia, fever, weight loss, weight gain, vision loss, decreased hearing, hoarseness, chest pain, syncope, dyspnea on exertion, peripheral edema, prolonged  cough, headaches, hemoptysis, abdominal pain, melena, hematochezia, severe indigestion/heartburn, hematuria, incontinence, genital sores, muscle weakness, suspicious skin lesions, transient blindness, difficulty walking, depression, unusual weight change, abnormal bleeding, enlarged lymph nodes, angioedema, and breast masses.    Physical Exam  General:  overweight-appearing.   140/80overweight-appearing.   Head:  Normocephalic and atraumatic without obvious abnormalities.  No apparent alopecia or balding. Eyes:  No corneal or conjunctival inflammation noted. EOMI. Perrla. Funduscopic exam benign, without hemorrhages, exudates or papilledema. Vision grossly normal. Ears:  External ear exam shows no significant lesions or deformities.  Otoscopic examination reveals clear canals, tympanic membranes are intact bilaterally without bulging, retraction, inflammation or discharge. Hearing is grossly normal bilaterally. Nose:  External nasal examination shows no deformity or inflammation. Nasal mucosa are pink and moist without lesions or exudates. Mouth:  Oral mucosa and oropharynx without lesions or exudates.  Teeth in good repair. Neck:  No deformities, masses, or tenderness noted. Chest Wall:  No deformities, masses, or tenderness noted. Breasts:  No mass, nodules, thickening, tenderness, bulging, retraction, inflamation, nipple discharge or skin changes noted.   Lungs:  Normal respiratory effort, chest expands symmetrically. Lungs are clear to auscultation, no crackles or wheezes. Heart:  Normal rate and regular rhythm. S1 and S2 normal without gallop, murmur, click, rub or other extra sounds. Abdomen:  Bowel sounds positive,abdomen soft and non-tender without masses, organomegaly or hernias noted. Msk:  No deformity or scoliosis noted of thoracic or lumbar spine.   Pulses:  dorsalis pedis pulses, full posterior tibia pulses faint Extremities:  No clubbing, cyanosis, edema, or deformity noted with normal full range of motion of all joints.   Neurologic:  No cranial nerve deficits noted. Station and gait are normal. Plantar reflexes are down-going bilaterally. DTRs are symmetrical throughout. Sensory, motor and coordinative functions appear intact. Skin:  Intact without suspicious lesions or rashes Cervical Nodes:  No lymphadenopathy noted Axillary Nodes:  No palpable lymphadenopathy Psych:  Cognition and judgment appear intact. Alert and cooperative with normal attention  span and concentration. No apparent delusions, illusions, hallucinations   Impression & Recommendations:  Problem # 1:  OSTEOARTHRITIS (ICD-715.90)  Her updated medication list for this problem includes:    Adult Aspirin Ec Low Strength 81 Mg Tbec (Aspirin) ..... One tab by mouth qday    Tramadol Hcl 50 Mg Tabs (Tramadol hcl) .Marland Kitchen... Take as needed  Her updated medication list for this problem includes:    Adult Aspirin Ec Low Strength 81 Mg Tbec (Aspirin) ..... One tab by mouth qday    Tramadol Hcl 50 Mg Tabs (Tramadol hcl) .Marland Kitchen... Take as needed  Problem # 2:  ESOPHAGEAL STRICTURE (ICD-530.3)  Problem # 3:  HYPERLIPIDEMIA (ICD-272.4)  Her updated medication list for this problem includes:    Zocor 20 Mg Tabs (Simvastatin) .Marland Kitchen... 1 tab by mouth daily  Her updated medication list for this problem includes:    Zocor 20 Mg Tabs (Simvastatin) .Marland Kitchen... 1 tab by mouth daily  Complete Medication List: 1)  Benazepril Hcl 20 Mg Tabs (Benazepril hcl) .... One tab by mouth qday 2)  Amlodipine Besylate 10 Mg Tabs (Amlodipine besylate) .Marland Kitchen.. 1 once daily 3)  Adult Aspirin Ec Low Strength 81 Mg Tbec (Aspirin) .... One tab by mouth qday 4)  Tramadol Hcl 50 Mg Tabs (Tramadol hcl) .... Take as needed 5)  Metoprolol Tartrate 25 Mg Tabs (Metoprolol tartrate) .Marland Kitchen.. 1 tab by mouth two times a day 6)  Zocor 20 Mg Tabs (Simvastatin) .Marland Kitchen.. 1 tab by mouth daily  7)  Amlodipine Besylate 10 Mg Tabs (Amlodipine besylate) .Marland Kitchen.. 1 tab by mouth daily 8)  Protonix 40 Mg Tbec (Pantoprazole sodium) .Marland Kitchen.. 1 once daily  Patient Instructions: 1)  Please schedule a follow-up appointment in 6 months. 2)  Limit your Sodium (Salt). 3)  It is important that you exercise regularly at least 20 minutes 5 times a week. If you develop chest pain, have severe difficulty breathing, or feel very tired , stop exercising immediately and seek medical attention. 4)  You need to lose weight. Consider a lower calorie diet and regular exercise.    5)  Take calcium +Vitamin D daily. 6)  Check your Blood Pressure regularly. If it is above: 140/90  you should make an appointment. 7)  Schedule your mammogram. Prescriptions: PROTONIX 40 MG TBEC (PANTOPRAZOLE SODIUM) 1 once daily  #90 x 6   Entered and Authorized by:   Gordy Savers  MD   Signed by:   Gordy Savers  MD on 12/21/2008   Method used:   Print then Give to Patient   RxID:   [id number] AMLODIPINE BESYLATE 10 MG TABS (AMLODIPINE BESYLATE) 1 TAB by mouth DAILY  #90 x 6   Entered and Authorized by:   Gordy Savers  MD   Signed by:   Gordy Savers  MD on 12/21/2008   Method used:   Print then Give to Patient   RxID:   [id number] ZOCOR 20 MG TABS (SIMVASTATIN) 1 TAB by mouth DAILY  #90 x 6   Entered and Authorized by:   Gordy Savers  MD   Signed by:   Gordy Savers  MD on 12/21/2008   Method used:   Print then Give to Patient   RxID:   [id number] METOPROLOL TARTRATE 25 MG TABS (METOPROLOL TARTRATE) 1 TAB by mouth two times a day  #180 x 6   Entered and Authorized by:   Gordy Savers  MD   Signed by:   Gordy Savers  MD on 12/21/2008   Method used:   Print then Give to Patient   RxID:   [id number] TRAMADOL HCL 50 MG TABS (TRAMADOL HCL) TAKE AS NEEDED  #90 x 6   Entered and Authorized by:   Gordy Savers  MD   Signed by:   Gordy Savers  MD on 12/21/2008   Method used:   Print then Give to Patient   RxID:   [id number] AMLODIPINE BESYLATE 10 MG TABS (AMLODIPINE BESYLATE) 1 once daily  #0 x 6   Entered and Authorized by:   Gordy Savers  MD   Signed by:   Gordy Savers  MD on 12/21/2008   Method used:   Print then Give to Patient   RxID:   [id number] BENAZEPRIL HCL 20 MG  TABS (BENAZEPRIL HCL) one tab by mouth qday  #90 x 6   Entered and Authorized by:   Gordy Savers  MD   Signed by:   Gordy Savers  MD on 12/21/2008   Method used:   Print then Give to  Patient   RxID:   [id number]

## 2010-07-13 NOTE — Assessment & Plan Note (Signed)
Summary: f/u from urgent care last night, cardiac enlargement/ls    Vital Signs:  Patient Profile:   65 Years Old Female Height:     63 inches Weight:      186.5 pounds Temp:     100.1 degrees F Pulse rate:   67 / minute BP sitting:   119 / 69  (right arm)  Pt. in pain?   yes    Location:   abdomen    Intensity:   3    Type:       burning  Vitals Entered By: Theresia Lo RN (January 29, 2007 2:38 PM)              Is Patient Diabetic? No   Chief Complaint:  urgent care f/u per Dr. Sandria Manly and cardiac enlargement.  History of Present Illness: S: 65 year old female who is following up for weakness, myalgia, abdominal pain.  Reports of having lower back pain that was sharp 6/10 that  started in the middle of back after a long bus ride to the [facility].   After trip from [place], she started to have myalgias all throughout the body.  Patient has also had intermittant low grade fevers around 100 to 101.  Reports of having diarrhea and constipation along with occasional lower abdominal pain.     Denies any N/V or any urinary symptoms, denies any blood in the stool.  she was seen at urgent care yesterday for these symtpoms.  U/A did not show signs of infection, CXR normal except for cardiomegaly.  Patient was sent home with Vicodin for the myalgias.  Patient is not currently sexually active.  Health maintencne is up to date    Past Medical History:    Reviewed history from 08/08/2006 and no changes required:       G2P2, 1 boy died in sleep age 58 mths., h/o combivent/flovent use in past, h/o Etoh abuse per [facility]   Family History:    Reviewed history from 08/08/2006 and no changes required:       Mother with DM II.  HTN.  No MI, cancer, lung/GI disease.  Social History:    Reviewed history from 08/08/2006 and no changes required:       Works at [employer], [employer].  Never married.  One child.  2 grandchildren, live in [place].  1/2 ppd x 30 yrs.  1-2 beers/  weekend. Quit smoking 11/04/03.  BF passed away in 11/03/00 .  No reg exercise.   Risk Factors:  Tobacco use:  quit    Physical Exam  General:     Well-developed,well-nourished,in no acute distress; alert,appropriate and cooperative throughout examination Head:     Normocephalic and atraumatic without obvious abnormalities. No apparent alopecia or balding. Eyes:     No corneal or conjunctival inflammation noted. EOMI. Perrla.  Mouth:     Oral mucosa and oropharynx without lesions or exudates.  Neck:     No deformities, masses, or tenderness noted. Lungs:     Normal respiratory effort, chest expands symmetrically. Lungs are clear to auscultation, no crackles or wheezes. Heart:     Normal rate and regular rhythm. S1 and S2 normal, soft I/VI systolic ejection murmor. Abdomen:     Bowel sounds positive,abdomen soft and non-tender without masses, organomegaly or hernias noted. Msk:     No deformity or scoliosis noted of thoracic or lumbar spine.  No joint swelling Extremities:     No clubbing, cyanosis, edema, or deformity noted with  normal full range of motion of all joints.   Neurologic:     No cranial nerve deficits noted. Station and gait are normal. Skin:     Intact without suspicious lesions or rashes Cervical Nodes:     No lymphadenopathy noted Axillary Nodes:     No palpable lymphadenopathy    Impression & Recommendations:  Problem # 1:  FUO (ICD-780.6) Assessment: New No obvious source of fever on exam.  Will check CBC with diff.  Given soft murmur, will check blood cultures to eval for SBE Orders: HIV-[facility] ([id number]) [facility]- Est  Level 4 (09811)  Future Orders: Culture, Blood- [facility] ([id number]) ... 01/31/2007   Problem # 2:  MYALGIA (ICD-729.1) Assessment: New May be related to viral illness. Will check CK Orders: CK (Creatine Kinase)-[facility] ([id number]) Sed Rate (ESR)-[facility] (85651) [facility]- Est  Level 4 (99214)   Problem # 3:  ABDOMINAL PAIN (ICD-789.00) Vague  pain.  Definitely non-acute abdomen.  If persists and fever continues will order CT scan.  Patient will call if she continues to have fever Orders: Comp Met-[facility] [phone] [id number]) CBC w/Diff-[facility] ([address]295) [facility]- Est  Level 4 (28413)   Problem # 4:  CARDIOMEGALY (ICD-429.3) Assessment: New Found on chest xray at [facility]. Will check 2-D ECHO Orders: Ultrasound (Ultrasound) [facility]- Est  Level 4 (24401)   Other Orders: Miscellaneous Lab Charge-[facility] (02725)   Patient Instructions: 1)  Take an 81mg  Aspirin daily 2)  Use a stool card and place a small sample of stool on the card and bring it into the clinic to be read. 3)  Will follow up after test results come back. schedule back in two weeks 4)  Have a echocardiogram done   appointment scheduled for 2-D echo 02/05/07 at 11:00 AM at [facility].  pt notified.    Vital Signs:  Patient Profile:   65 Years Old Female Height:     63 inches Weight:      186.5 pounds Temp:     100.1 degrees F Pulse rate:   67 / minute BP sitting:   119 / 69  (right arm)  Pt. in pain?   yes    Location:   abdomen    Intensity:   3    Type:       burning  Vitals Entered By: Theresia Lo RN (January 29, 2007 2:38 PM)              Is Patient Diabetic? No

## 2010-07-13 NOTE — Progress Notes (Signed)
Summary: cheaper ppi  Phone Note Call from Patient Call back at Home Phone [phone]   Caller: Patient Call For: Gordy Savers  MD Summary of Call: pt can not afford generic protonix please call [facility] [address] [phone] for something cheaper Initial call taken by: Heron Sabins,  February 09, 2010 3:17 PM  Follow-up for Phone Call        try generic omeprazole 20 mg  #90 one daily  Follow-up by: Gordy Savers  MD,  February 09, 2010 5:04 PM    New/Updated Medications: OMEPRAZOLE 20 MG CPDR (OMEPRAZOLE) 1 by mouth qd Prescriptions: OMEPRAZOLE 20 MG CPDR (OMEPRAZOLE) 1 by mouth qd  #90 x 3   Entered by:   Duard Brady LPN   Authorized by:   Gordy Savers  MD   Signed by:   Duard Brady LPN on 21/30/8657   Method used:   Electronically to        [facility] [phone]* (retail)       [address]       [place], [place]  [address]       Ph: [phone]       Fax: [phone]   RxID:   [id number]

## 2010-07-13 NOTE — Letter (Signed)
Summary: Patient Notice- Polyp Results  [facility]  [address] [place], [place] [address]   Phone: [phone]  Fax: [phone]        February 26, 2008 MRN: [id number]    [facility] [address] [place], [place]  [address]    Dear Ms. Poth,  I am pleased to inform you that the colon polyp(s) removed during your recent colonoscopy was (were) found to be benign (no cancer detected) upon pathologic examination.  I recommend you have a repeat colonoscopy examination in _5 years to look for recurrent polyps, as having colon polyps increases your risk for having recurrent polyps or even colon cancer in the future.  Should you develop new or worsening symptoms of abdominal pain, bowel habit changes or bleeding from the rectum or bowels, please schedule an evaluation with either your primary care physician or with me.  Additional information/recommendations:  __ No further action with gastroenterology is needed at this time. Please      follow-up with your primary care physician for your other healthcare      needs.  __ Please call [phone] to schedule a return visit to review your      situation.  __ Please keep your follow-up visit as already scheduled.  _x_ Continue treatment plan as outlined the day of your exam.  Please call us if you are having persistent problems or have questions about your condition that have not been fully answered at this time.  Sincerely,  Louis Meckel MD  This letter has been electronically signed by your physician.

## 2010-07-13 NOTE — Progress Notes (Signed)
Summary: [facility] [address]  Phone Note Call from Patient Call back at Home Phone [phone]   Caller: Patient Call For: Gordy Savers  MD Summary of Call: pt rx was sent to wrong pharm and their will not switch it to correct pharm [facility] [phone] Initial call taken by: Heron Sabins,  February 03, 2010 12:03 PM  Follow-up for Phone Call        Rx sent to correct pharmacy at [facility] on [address] Called pt she is aware Follow-up by: Kathrynn Speed CMA,  February 03, 2010 1:54 PM    Prescriptions: SIMVASTATIN 20 MG TABS (SIMVASTATIN) one daily  #90 x 6   Entered by:   Kathrynn Speed CMA   Authorized by:   Gordy Savers  MD   Signed by:   Kathrynn Speed CMA on 02/03/2010   Method used:   Electronically to        [facility] [phone]* (retail)       [address]       [place], [place]  [address]       Ph: [phone]       Fax: [phone]   RxID:   [id number] PANTOPRAZOLE SODIUM 20 MG TBEC (PANTOPRAZOLE SODIUM) one daily  #90 x 6   Entered by:   Kathrynn Speed CMA   Authorized by:   Gordy Savers  MD   Signed by:   Kathrynn Speed CMA on 02/03/2010   Method used:   Electronically to        [facility] [address] [phone]* (retail)       [address]       [place], [place]  [address]       Ph: [phone]       Fax: [phone]   RxID:   [id number] METOPROLOL TARTRATE 25 MG TABS (METOPROLOL TARTRATE) 1 TAB by mouth two times a day  #180 x 6   Entered by:   Kathrynn Speed CMA   Authorized by:   Gordy Savers  MD   Signed by:   Kathrynn Speed CMA on 02/03/2010   Method used:   Electronically to        [facility] [phone]* (retail)       [address]       [place], [place]  [address]       Ph: [phone]       Fax: [phone]   RxID:   [id number] TRAMADOL HCL 50 MG TABS (TRAMADOL HCL) TAKE AS NEEDED  #90 x 6   Entered by:   Kathrynn Speed CMA   Authorized by:   Gordy Savers  MD   Signed by:   Kathrynn Speed CMA on 02/03/2010   Method used:   Electronically to        [facility] [phone]* (retail)       [address]       [place], [place]  [address]       Ph: [phone]       Fax: [phone]   RxID:   [id number] BENAZEPRIL HCL 20 MG  TABS (BENAZEPRIL HCL) one tab by mouth qday  #90 x 6   Entered by:   Kathrynn Speed CMA   Authorized by:   Gordy Savers  MD   Signed by:   Kathrynn Speed CMA on 02/03/2010   Method used:   Electronically to        [facility]  [address] [phone]* (retail)       [address]       [place], [place]  [address]       Ph: [phone]       Fax: [phone]   RxID:   [id number]

## 2010-07-13 NOTE — Letter (Signed)
Summary: Patient [facility] Biopsy Results  [facility]  [address] [place], [place] [address]   Phone: [phone]  Fax: [phone]        January 22, 2008 MRN: [id number]    [facility] [address] [place], [place]  [address]    Dear Ms. Timson,  I am pleased to inform you that the biopsies taken during your recent endoscopic examination did not show any evidence of cancer upon pathologic examination.  Additional information/recommendations:  __No further action is needed at this time.  Please follow-up with      your primary care physician for your other healthcare needs.  __ Please call [phone] to schedule a return visit to review      your condition.  _x_ Continue with the treatment plan as outlined on the day of your      exam.  __ You should have a repeat endoscopic examination for thi_s problem              in _ months/years.   Please call us if you are having persistent problems or have questions about your condition that have not been fully answered at this time.  Sincerely,  Louis Meckel MD  This letter has been electronically signed by your physician.  Appended Document: Patient Notice-Endo Biopsy Results Letter mailed to patient.

## 2010-07-19 NOTE — Assessment & Plan Note (Signed)
Summary: per check out/sf, rs per pt-mj rs per pt call-mb/agh APPT CON...   Vital Signs:  Patient profile:   65 year old female Height:      63 inches Weight:      183 pounds BMI:     32.53 Pulse rate:   65 / minute Pulse rhythm:   regular BP sitting:   152 / 94  (left arm) Cuff size:   regular  Vitals Entered By: Judithe Modest CMA (July 13, 2010 3:22 PM)  Primary Provider:  Dr. Amador Cunas   History of Present Illness: 65 yo with history of HTN, atypical chest pain, and mild aortic insufficiency returns for followup.  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  She is doing well in general.  She can walk on flat ground with no dyspnea but gets mildly short of breath with steps.  No chest pain.  SBP has been running in the 150s at home and is 152/94 today.  Last echo in 3/11 showed preserved EF at 65% with mild aortic insuffiiciency.     ECG: NSR, normal  Labs (2/11): LDL 79, HDL 66.5  Current Medications (verified): 1)  Benazepril Hcl 20 Mg  Tabs (Benazepril Hcl) .... One Tab By Mouth Qday 2)  Adult Aspirin Ec Low Strength 81 Mg  Tbec (Aspirin) .... One Tab By Mouth Qday 3)  Tramadol Hcl 50 Mg Tabs (Tramadol Hcl) .... Two Times A Day Prn 4)  Metoprolol Tartrate 25 Mg Tabs (Metoprolol Tartrate) .Marland Kitchen.. 1 Tab By Mouth Two Times A Day 5)  Calcium-Vitamin D 250-125 Mg-Unit Tabs (Calcium Carbonate-Vitamin D) .Marland Kitchen.. 1 Tab By Mouth Once Daily 6)  Simvastatin 20 Mg Tabs (Simvastatin) .... One Daily 7)  Omeprazole 20 Mg Cpdr (Omeprazole) .Marland Kitchen.. 1 By Mouth Qd  Allergies (verified): No Known Drug Allergies  Past History:  Past Medical History: 1. Hypertension. 2. Degenerative joint disease. 3. Echo (8/08): mild LVH, EF 65%, mild aortic insufficiency.  Echo (3/11): Mild to moderate LVH, EF 65%, moderate aortic insufficiency, PA systolic pressure 37 mmHg.  4. History of esophageal stricture status post dilatation, August 2009. 5. Peptic ulcer disease. 6.  Hypercholesteremia. 7. Atypical chest pain.  Exercise treadmill test in September 2009.  The patient exercised for 7 minutes 30 seconds.   There was no evidence for ischemia by EKG.  8. Asthma 9. Diverticulosis, colon  Family History: Reviewed history from 12/21/2008 and no changes required. Mother  (78)with DM II.  HTN.  No MI, cancer, lung/GI disease. No FH of Colon Cancer: Father- unknown status One sister:  died cerebral aneurysm  Social History: Reviewed history from 01/29/2007 and no changes required. Retired from working at [employer] at [employer].  Never married.  One child.  2 grandchildren, live in [place].  1/2 ppd x 30 yrs.  1-2 beers/ weekend. Quit smoking 2003-11-03.  BF passed away in Nov 02, 2000 .  No reg exercise.  Review of Systems       All systems reviewed and negative except as per HPI.   Physical Exam  General:  Well developed, well nourished, in no acute distress. Neck:  Neck supple, no JVD. No masses, thyromegaly or abnormal cervical nodes. Lungs:  Clear bilaterally to auscultation and percussion. Heart:  Non-displaced PMI, chest non-tender; regular rate and rhythm, S1, S2. 1/6 SEM RUSB. Soft S4. Carotid upstroke normal, no bruit. Pedals normal pulses. Trace ankle edema.  Abdomen:  Bowel sounds positive; abdomen soft and non-tender without masses, organomegaly, or  hernias noted. No hepatosplenomegaly. Extremities:  No clubbing or cyanosis. Neurologic:  Alert and oriented x 3. Psych:  Normal affect.   Impression & Recommendations:  Problem # 1:  AORTIC INSUFFICIENCY (ICD-424.1) Mild by echocardiogram.  Control of BP will likely help prevent progression.   Problem # 2:  HYPERTENSION, BENIGN SYSTEMIC (ICD-401.1) SBP in 150s.  Increase benazepril to 40 mg daily with BMET in 2 wks.   Problem # 3:  HYPERLIPIDEMIA (ICD-272.4) Will check lipids/LFTs.   Patient Instructions: 1)  Your physician has recommended you make the following change in your medication:  2)   Increase Benazepril to 40mg  daily--you can take two 20mg  tablets daily. 3)  Your physician recommends that you return for a FASTING lipid profile/liver profile/BMP/TSH/CBC in 1-2 weeks.  4)  Your physician wants you to follow-up in: 1 year with Dr Shirlee Latch.Priscille Loveless 2013)  You will receive a reminder letter in the mail two months in advance. If you don't receive a letter, please call our office to schedule the follow-up appointment. Prescriptions: BENAZEPRIL HCL 40 MG TABS (BENAZEPRIL HCL) one daily  #30 x 6   Entered by:   Katina Dung, RN, BSN   Authorized by:   Marca Ancona, MD   Signed by:   Katina Dung, RN, BSN on 07/13/2010   Method used:   Electronically to        [facility] [phone] (retail)       [address]       [place], [place]  [address]       Ph: [phone]       Fax: [phone]   RxID:   [id number]

## 2010-07-27 ENCOUNTER — Other Ambulatory Visit: Payer: Self-pay | Admitting: Cardiology

## 2010-07-27 ENCOUNTER — Encounter: Payer: Self-pay | Admitting: Cardiology

## 2010-07-27 ENCOUNTER — Other Ambulatory Visit (INDEPENDENT_AMBULATORY_CARE_PROVIDER_SITE_OTHER): Payer: Self-pay

## 2010-07-27 DIAGNOSIS — I359 Nonrheumatic aortic valve disorder, unspecified: Secondary | ICD-10-CM

## 2010-07-27 DIAGNOSIS — I10 Essential (primary) hypertension: Secondary | ICD-10-CM

## 2010-07-27 LAB — BASIC METABOLIC PANEL
BUN: 11 mg/dL (ref 6–23)
CO2: 30 mEq/L (ref 19–32)
Calcium: 9.5 mg/dL (ref 8.4–10.5)
Chloride: 106 mEq/L (ref 96–112)
Creatinine, Ser: 0.8 mg/dL (ref 0.4–1.2)
GFR: 93.19 mL/min (ref >60.00)
Glucose, Bld: 90 mg/dL (ref 70–99)
Potassium: 4.1 mEq/L (ref 3.5–5.1)
Sodium: 142 mEq/L (ref 135–145)

## 2010-07-27 LAB — CBC WITH DIFFERENTIAL/PLATELET
Basophils Absolute: 0 10*3/uL (ref 0.0–0.1)
Basophils Relative: 0.7 % (ref 0.0–3.0)
Eosinophils Absolute: 0 10*3/uL (ref 0.0–0.7)
Eosinophils Relative: 1.1 % (ref 0.0–5.0)
HCT: 37.8 % (ref 36.0–46.0)
Hemoglobin: 12.6 g/dL (ref 12.0–15.0)
Lymphocytes Relative: 44.1 % (ref 12.0–46.0)
Lymphs Abs: 1.6 10*3/uL (ref 0.7–4.0)
MCHC: 33.3 g/dL (ref 30.0–36.0)
MCV: 99.3 fl (ref 78.0–100.0)
Monocytes Absolute: 0.4 10*3/uL (ref 0.1–1.0)
Monocytes Relative: 9.5 % (ref 3.0–12.0)
Neutro Abs: 1.7 10*3/uL (ref 1.4–7.7)
Neutrophils Relative %: 44.6 % (ref 43.0–77.0)
Platelets: 206 10*3/uL (ref 150.0–400.0)
RBC: 3.81 Mil/uL — ABNORMAL LOW (ref 3.87–5.11)
RDW: 14 % (ref 11.5–14.6)
WBC: 3.7 10*3/uL — ABNORMAL LOW (ref 4.5–10.5)

## 2010-07-27 LAB — HEPATIC FUNCTION PANEL
ALT: 22 U/L (ref 0–35)
AST: 27 U/L (ref 0–37)
Albumin: 3.8 g/dL (ref 3.5–5.2)
Alkaline Phosphatase: 46 U/L (ref 39–117)
Bilirubin, Direct: 0.2 mg/dL (ref 0.0–0.3)
Total Bilirubin: 0.8 mg/dL (ref 0.3–1.2)
Total Protein: 6.2 g/dL (ref 6.0–8.3)

## 2010-07-27 LAB — LIPID PANEL
Cholesterol: 121 mg/dL (ref 0–200)
HDL: 46.9 mg/dL (ref >39.00)
LDL Cholesterol: 56 mg/dL (ref 0–99)
Total CHOL/HDL Ratio: 3
Triglycerides: 91 mg/dL (ref 0.0–149.0)
VLDL: 18.2 mg/dL (ref 0.0–40.0)

## 2010-07-27 LAB — TSH: TSH: 1.52 u[IU]/mL (ref 0.35–5.50)

## 2010-08-06 ENCOUNTER — Encounter: Payer: Self-pay | Admitting: Internal Medicine

## 2010-08-07 ENCOUNTER — Encounter: Payer: Self-pay | Admitting: Internal Medicine

## 2010-08-07 ENCOUNTER — Ambulatory Visit (INDEPENDENT_AMBULATORY_CARE_PROVIDER_SITE_OTHER): Payer: Self-pay | Admitting: Internal Medicine

## 2010-08-07 DIAGNOSIS — I10 Essential (primary) hypertension: Secondary | ICD-10-CM

## 2010-08-07 DIAGNOSIS — E785 Hyperlipidemia, unspecified: Secondary | ICD-10-CM

## 2010-08-07 NOTE — Progress Notes (Signed)
  Subjective:    Patient ID: Tanya Gray, female    DOB: 1945-10-16, 65 y.o.   MRN: [id number]  HPI   65 year old patient who is seen today for followup of her hypertension. She does track a home blood pressure readings with much results. She has a history of mild aortic insufficiency as well as hypertension. She is doing quite well and denies any cardiopulmonary complaints. She has remote history of peptic ulcer disease she remains on omeprazole 20 mg daily no GI concerns.   Review of Systems  Constitutional: Negative.   HENT: Negative for hearing loss, congestion, sore throat, rhinorrhea, dental problem, sinus pressure and tinnitus.   Eyes: Negative for pain, discharge and visual disturbance.  Respiratory: Negative for cough and shortness of breath.   Cardiovascular: Negative for chest pain, palpitations and leg swelling.  Gastrointestinal: Negative for nausea, vomiting, abdominal pain, diarrhea, constipation, blood in stool and abdominal distention.  Genitourinary: Negative for dysuria, urgency, frequency, hematuria, flank pain, vaginal bleeding, vaginal discharge, difficulty urinating, vaginal pain and pelvic pain.  Musculoskeletal: Negative for joint swelling, arthralgias and gait problem.  Skin: Negative for rash.  Neurological: Negative for dizziness, syncope, speech difficulty, weakness, numbness and headaches.  Hematological: Negative for adenopathy.  Psychiatric/Behavioral: Negative for behavioral problems, dysphoric mood and agitation. The patient is not nervous/anxious.        Objective:   Physical Exam  Constitutional: She is oriented to person, place, and time. She appears well-developed and well-nourished.  HENT:  Head: Normocephalic.  Right Ear: External ear normal.  Left Ear: External ear normal.  Mouth/Throat: Oropharynx is clear and moist.  Eyes: Conjunctivae and EOM are normal. Pupils are equal, round, and reactive to light.  Neck: Normal range of motion. Neck  supple. No thyromegaly present.  Cardiovascular: Normal rate, regular rhythm, normal heart sounds and intact distal pulses.         Grade 2/6 systolic murmur at the primary aortic area  Pulmonary/Chest: Effort normal and breath sounds normal.  Abdominal: Soft. Bowel sounds are normal. She exhibits no mass. There is no tenderness.  Musculoskeletal: Normal range of motion.  Lymphadenopathy:    She has no cervical adenopathy.  Neurological: She is alert and oriented to person, place, and time.  Skin: Skin is warm and dry. No rash noted.  Psychiatric: She has a normal mood and affect. Her behavior is normal.          Assessment & Plan:   hypertension stable. We'll continue present regimen of ACE inhibitor and metoprolol.  Dyslipidemia we'll continue simvastatin 20 mg daily   We'll schedule for a full physical in 6 months.

## 2010-08-07 NOTE — Patient Instructions (Signed)
Limit your sodium (Salt) intake    It is important that you exercise regularly, at least 20 minutes 3 to 4 times per week.  If you develop chest pain or shortness of breath seek  medical attention.  Return in 6 months for follow-up  You need to lose weight.  Consider a lower calorie diet and regular exercise. 

## 2010-10-24 NOTE — Assessment & Plan Note (Signed)
[facility]                            CARDIOLOGY OFFICE NOTE   NAME:Tanya [name], Tanya [name]                   MRN:          [id number]  DATE:07/26/2008                            DOB:          1946-01-26    PRIMARY CARE PHYSICIAN:  Merlene Laughter. Renae Gloss, M.D.   HISTORY OF PRESENT ILLNESS:  This is a 65 year old with a history of  hypertension and hyperlipidemia who presents for followup visit for  evaluation of her cardiac risk factors today.  Back in August, the  patient was seen in the emergency department with chest pain.  Actually,  she ended up having an esophageal stricture which was likely the cause  of her discomfort.  She had an exercise treadmill test as an outpatient  that showed no evidence for ischemia by EKG.  Since that time, the  patient has done quite well.  She has had no further chest pain.  She  does work in Training and development officer over at [employer] and is active  during the day.  She does not get shortness of breath on exertion.  Her  blood pressure is under good control.  We have been titrating her  medications and her cholesterol also is acceptable.   PAST MEDICAL HISTORY:  1. Hypertension.  2. Degenerative joint disease.  3. Echocardiogram, August 2008 which showed mild LVH, EF 65%, mild      aortic insufficiency.  4. History of esophageal stricture status post dilatation, August      2009.  5. Peptic ulcer disease.  6. Hypercholesteremia.  7. Exercise treadmill test in September 2009.  The patient exercised      for 7 minutes 30 seconds.  She was not going on meds.  There was no      evidence for ischemia by EKG.   SOCIAL HISTORY:  The patient lives by herself.  She has a grandson who  is lives in [place].  She works in Training and development officer at [employer].  She quit smoking about 4-5 years ago.  She does not use any  illicit drugs.  She drinks alcohol rarely.   MEDICATIONS:  1. Benazepril 20 mg daily.  2. Aspirin 81  mg daily.  3. Metoprolol 25 b.i.d.  4. Protonix 40 mg daily.  5. Zocor 20 mg daily.  6. Amlodipine 10 mg daily.   Labs from last week of February 2010, LDL 94, HDL 54, and triglycerides  66.  LFTs normal, hemoglobin A1c 5.8, creatinine 0.8.   PHYSICAL EXAMINATION:  VITAL SIGNS:  Blood pressure is 126/70, heart  rate is 55 and regular.  Weight is 191 pounds.  GENERAL:  This is a well-developed female, in no apparent distress.  NEUROLOGICALLY:  Alert and oriented x3, normal affect.  LUNGS:  Clear to auscultation bilaterally.  NECK:  No JVD.  No thyromegaly or thyroid nodule.  CARDIOVASCULAR:  Heart, regular S1 and S2.  No S3.  No S4.  There is no  murmur.  There is 1+ edema at the ankles to about one-quarter of the way  up lower legs bilaterally.  No carotid bruit.  ABDOMEN:  Soft and nontender.  No hepatosplenomegaly.  Normal bowel  sounds.   ASSESSMENT AND PLAN:  This is a 65 year old with the history of  hypertension and noncardiac chest pain who presents Cardiology Clinic  for evaluation.  1. Hypertension.  The patient's blood pressure under good control      today at 126/70.  We will continue on her current meds.  2. Hyperlipidemia.  The patient's LDL is less than 100.  She on Zocor      20 mg daily.  We will continue her on this current regimen.  3. Atypical chest pain.  The patient has had no further episodes of      chest pain.  She did have a low-risk exercise treadmill test in      September 2009.  We will continue her with aggressive risk factor      control.  4. See the patient back in a year.  We will get cholesterol before she      comes back.     Marca Ancona, MD  Electronically Signed    DM/MedQ  DD: 07/26/2008  DT: 07/26/2008  Job #: [id number]   cc:   Merlene Laughter. Renae Gloss, M.D.

## 2010-10-24 NOTE — H&P (Signed)
Tanya [name], CROTTY          ACCOUNT NO.:  [id number]   MEDICAL RECORD NO.:  [id number]          PATIENT TYPE:  INP   LOCATION:  1846                         FACILITY:  [facility]   PHYSICIAN:  Elliot Cousin, M.D.    DATE OF BIRTH:  03-02-1946   DATE OF ADMISSION:  01/14/2008  DATE OF DISCHARGE:                              HISTORY & PHYSICAL   PRIMARY CARE PHYSICIAN:  Dr. Andi Devon.   CHIEF COMPLAINT:  Chest pain.   HISTORY OF PRESENT ILLNESS:  The patient is a 65 year old woman with a  past medical history significant for hypertension and degenerative joint  disease who presents to the emergency department with a chief complaint  of chest pain.  The chest pain started approximately 3 days ago and it  has been intermittent in nature.  The pain is located substernally.  She  describes the pain as a tightness and a soreness on the inside of my  chest.  The pain is generally a 7/10 in intensity, however, it is now  approximately a 3/10 in intensity after receiving Nitropaste.  The pain  has been associated with left arm pain.  The left arm pain sometimes  occurs without accompanying chest pain.  The patient attributes the left  arm pain to arthritis.  When she first experienced chest pain, it was at  a time that she was eating.  She thought it was indigestion, however,  the pain did not go away until she went to sleep.  The pain is usually  relieved when she sleeps.  The pain has also been accompanied by  diaphoresis and occasional shortness of breath but no nausea, swelling  in the legs, heartburn, or lightheadedness.  Prior to her latest  complaints of chest pain, the patient presented to the Urgent Care in  August of 2008 with chest pain.  She was referred for an echocardiogram  at that time.  Otherwise, the patient has never been evaluated by a  cardiologist.   During the evaluation in the emergency department, the patient is noted  to be hemodynamically stable although  she is mildly hypertensive with a  blood pressure of 154/75.  Her EKG reveals normal sinus rhythm with a  heart rate of 63 beats per minute and no ST or T-wave abnormalities.  Her chest x-ray reveals no acute changes although there is evidence of  mild central airway thickening.  Her initial cardiac markers are  negative.  However, because of her risk factors, she will be admitted  for further evaluation and management.   PAST MEDICAL HISTORY:  1. Chest pain in August of 2008.  Evaluated at the [facility]gent      [facility] and then discharged from the urgent care.  The 2-D      echocardiogram at the time revealed an ejection fraction of 65%,      mild LVH, and mild aortic valvular regurgitation disease.  (The      study was noted to be limited.)  2. Hypertension.  3. Degenerative joint disease.  4. Former smoker.   MEDICATIONS:  1. Verapamil 360 mg daily.  2. Lotensin  20 mg daily.  3. Tramadol 50 mg 4 times daily as needed.   ALLERGIES:  No known drug allergies.   SOCIAL HISTORY:  The patient is single.  She has 1 son.  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  She stopped smoking 3  years ago after smoking approximately 30 years.  She drinks alcohol on  occasion.  She denies any illicit drug use.   FAMILY HISTORY:  Her mother is 58 years of age and has a history of type  2 diabetes mellitus, thyroid problems, and chronic bronchitis.  The  health and whereabouts of her father is unknown.   REVIEW OF SYSTEMS:  The patient's review of systems is positive for  achiness in both arms and both legs attributable to arthritis.  Otherwise review of systems is negative.   EXAM:  Temperature 98.7, blood pressure 154/75, pulse 66, respiratory  rate 20, oxygen saturation 99% on room air.  GENERAL:  The patient is a pleasant, alert 65 year old [place]  woman who is currently sitting up in bed in no acute distress.  HEENT:  Head is normocephalic,  nontraumatic.  Pupils are equal, round,  and reactive to light.  Extraocular movements are intact.  Conjunctivae  are clear.  Sclerae are white.  Tympanic membranes were not examined.  Nasal mucosa is mildly dry.  No sinus tenderness.  Oropharynx reveals  fair dentition.  Mucous membranes are mildly dry.  No posterior exudate  or erythema.  NECK:  Supple.  No adenopathy, no thyromegaly, no bruit, no JVD.  LUNGS:  Clear to auscultation bilaterally.  HEART:  S1, S2 with a soft systolic murmur.  ABDOMEN:  Mildly obese.  Positive bowel sounds, soft, nontender,  nondistended.  No hepatosplenomegaly, no masses palpated.  EXTREMITIES:  Pedal pulses 2+ bilaterally.  No pretibial edema and no  pedal edema.  NEUROLOGIC:  The patient is alert and oriented x3.  Cranial nerves II-  XII are intact.  Strength is 5/5 throughout.  Sensation is intact.   ADMISSION LABORATORIES:  EKG and chest x-ray results are above.  WBC  5.7, hemoglobin 12.7, platelets 259.  Sodium 140, potassium 4.1,  chloride 105, BUN 14, creatinine 1, ionized calcium 1.21, CO2 29.  Myoglobin 63.3, CK-MB 1.1, troponin I less than 0.05.  PT 12.9, INR 1,  PTT 30.  Urine drug screen negative.  Urinalysis:  Small bilirubin, 40  ketones, negative nitrite, negative leukocyte esterase.  BNP less than  30.   ASSESSMENT:  1. Chest pain.  The patient's chest pain appears to be somewhat      atypical, however, she does have risk factors for coronary artery      disease including age and hypertension.  2. Hypertension.  The patient is treated chronically with verapamil      and Lotensin.  3. Systolic murmur on exam with echocardiographic evidence of mild      aortic regurgitation, and an ejection fraction of 65% in August of      2008.   PLAN:  1. The emergency department physician started treatment with      Nitropaste, which appears to be helping; will continue.  2. Will start aspirin therapy, prophylactic heparin, oxygen therapy,       and p.r.n. morphine.  3. Will add beta blockade therapy to Lotensin and verapamil.  4. Proton pump inhibitor therapy.  5. Will consult [facility] for further risk stratification.  6. For further evaluation, will check cardiac enzymes,  TSH, fasting      lipid panel, and a D-dimer.  If the patient's D-dimer is elevated,      we will proceed to further evaluate with a CT scan of the chest to      rule out PE.      Elliot Cousin, M.D.  Electronically Signed     DF/MEDQ  D:  01/14/2008  T:  01/14/2008  Job:  [id number]   cc:   Merlene Laughter. Renae Gloss, M.D.

## 2010-10-24 NOTE — Discharge Summary (Signed)
Tanya [name], Tanya [name]          ACCOUNT NO.:  [id number]   MEDICAL RECORD NO.:  [id number]          PATIENT TYPE:  INP   LOCATION:  4703                         FACILITY:  [facility]   PHYSICIAN:  Elliot Cousin, M.D.    DATE OF BIRTH:  [birth year]   DATE OF ADMISSION:  01/14/2008  DATE OF DISCHARGE:  01/16/2008                               DISCHARGE SUMMARY   DISCHARGE DIAGNOSES:  1. Chest pain thought to be secondary to esophageal stricture.      Myocardial infarction ruled out.  CT scan of the chest negative for      pulmonary embolism.  2. Hyperlipidemia.  3. Hypertension.  4. Antral ulcer and esophageal stricture per      esophagogastroduodenoscopy by Dr. Arlyce Dice on January 15, 2008.   DISCHARGE MEDICATIONS:  1. Benazepril 20 mg daily.  2. Verapamil 180 mg b.i.d.  3. Aspirin 81 mg daily.  4. Tramadol 50 mg four times daily as needed.  5. Metoprolol 25 mg b.i.d.  6. Zocor 20 mg nightly.  7. Protonix 40 mg daily.  8. Avoid all NSAIDs except for aspirin once daily.   DISCHARGE DISPOSITION:  The patient is being discharged to home in  improved and stable condition.  She was advised to follow up with Dr.  Renae Gloss in 1 to 2 weeks, cardiologist Dr. Shirlee Latch on February 04, 2008, at  9 o'clock a.m., and Dr. Arlyce Dice during the first week in September 2009.   CONSULTATIONS:  1. [place] cardiologists, Dr. Antoine Poche and Dr. Tenny Craw.  2. Gastroenterologist, Dr. Arlyce Dice.   PROCEDURE PERFORMED:  1. Esophagogastroduodenoscopy, status post esophageal dilatation on      January 16, 2008, by Dr. Arlyce Dice.  2. Esophagogastroduodenoscopy on January 15, 2008.  The results revealed      esophageal stricture, antral ulcer, acute, without hemorrhage.  3. CT scan of the chest on January 15, 2008.  The results revealed      negative for acute pulmonary embolism.  Coronary and aortic      calcifications.  4. Chest x-ray on January 14, 2008.  The results revealed stable      cardiomegaly.  No acute findings in the  chest.  Mild chronic      central airway thickening.   HISTORY OF PRESENT ILLNESS:  The patient is a 65 year old woman with a  past medical history significant for hypertension and degenerative joint  disease, who presented to the emergency department on January 14, 2008,  with a chief complaint of chest pain.  The patient described the chest  pain as a tightness.  There was radiation to the left arm.  When the  patient was evaluated in the emergency department, she was noted to be  hemodynamically stable.  Her EKG revealed no acute ST or T-wave  abnormalities.  Her initial cardiac markers were negative.  However  because of her cardiac risk factors, she was admitted for further  evaluation and management.   For additional details, please see the dictated history and physical.   HOSPITAL COURSE:  1. CHEST PAIN:  The  patient was started on prophylactic subcutaneous  heparin, proton pump inhibitor therapy with Protonix, nitropaste,      aspirin, oxygen, and morphine as needed.  She was also continued on      Lotensin and the verapamil.  Because she was hypertensive,      metoprolol was added at 12.5 mg b.i.d.  For further evaluation,      cardiac enzymes were ordered as well as a fasting lipid panel and      TSH.  The patient's cardiac enzymes were completely normal during      the hospitalization.  Her TSH was within normal limits at 0.877.      Her fasting lipid profile revealed a total cholesterol of 207,      triglycerides of 66, HDL of 60, and LDL of 134.  The patient was      subsequently started on Zocor 20 mg daily.  A D-dimer was ordered      as well and it was slightly elevated at 0.49.  Because of the      slight elevation, a CT scan of the chest was performed to rule out      PE.  The CT scan of the chest was negative for PE.   [place] [facility] was consulted for further risk stratification and  evaluation.  Dr. Antoine Poche provided the initial consultation and agreed   with the medical management.  The tentative plan was for the patient to  undergo an inpatient stress test if her cardiac enzymes were abnormal  and an outpatient stress test if she completely ruled out.  The patient  did rule out for a myocardial infarction.  She will be following up with  cardiologist, Dr. Shirlee Latch on February 04, 2008, for further evaluation.   1. PEPTIC ULCER DISEASE AND ESOPHAGEAL STRICTURE:  Upon further      questioning, the patient complained of pain when swallowing.  She      pointed to her esophagus and complained of a pressure-like      sensation during eating.  Given her description, it was felt that      the patient's chest pain was more likely GI in origin.  Therefore,      gastroenterologist, Dr. Arlyce Dice was consulted.  He performed an EGD      on January 15, 2008, and it revealed an esophageal stricture and      antral ulcerations.  Because the patient had been given heparin,      the dilatation was not performed at that time.  The subcutaneous      heparin was subsequently discontinued.  The esophageal dilatation      was performed today by Dr. Arlyce Dice.  The procedure was successful.      The patient is currently chest-pain free.  She was advised to avoid      all NSAID medications with exception of baby aspirin once daily.      The patient voiced understanding.      Elliot Cousin, M.D.  Electronically Signed     DF/MEDQ  D:  01/16/2008  T:  01/17/2008  Job:  [id number]   cc:   Merlene Laughter. Renae Gloss, M.D.  Barbette Hair. Arlyce Dice, MD,FACG  Marca Ancona, MD

## 2010-10-24 NOTE — Procedures (Signed)
[facility]                              EXERCISE TREADMILL   NAME:MCCLAINDwyane [name]                   MRN:          [id number]  DATE:02/26/2008                            DOB:          [birth year]    PRIMARY CARE PHYSICIAN:  Merlene Laughter. Renae Gloss, MD   INDICATION:  Atypical chest pain.   PROCEDURE:  The patient exercised according to do standard Bruce  protocol for 7 minutes and 30 seconds.  She achieved a work level of  9.10 METS.  The resting heart rate of 90 beats per minute where as  maximal heart rate of 160 beats per minute, this value represents 100%  of the maximal age predicted heart rate.  The resting heart rate of  135/86 mmHg where as the maximum of 183/96 mmHg, and the exercise stress  test was actually terminated accidentally by hitting the wrong button,  so the exercise stress test was stopped due to fatigue.  There was no  chest pain.  There was no shortness of breath.  Interpretation of the  EKG showed nonspecific upsloping 0.5 to 1 mm ST-segment depression in  the lateral leads V4 and V5.  There was no significant 1 mm downsloping  or horizontal ST-segment depression seen on this study.  The patient had  good exercise capacity and in the absence of significant EKG changes I  think this is a low-risk treadmill test.   CONCLUSION:  No evidence for ischemia on this treadmill stress test.     Marca Ancona, MD  Electronically Signed    DM/MedQ  DD: 02/26/2008  DT: 02/27/2008  Job #: [id number]   cc:   Merlene Laughter. Renae Gloss, M.D.

## 2010-10-24 NOTE — Consult Note (Signed)
Tanya [name], Tanya [name]          ACCOUNT NO.:  [id number]   MEDICAL RECORD NO.:  [id number]          PATIENT TYPE:  INP   LOCATION:  4703                         FACILITY:  [facility]   PHYSICIAN:  Marca Ancona, MD      DATE OF BIRTH:  29-Apr-1946   DATE OF CONSULTATION:  DATE OF DISCHARGE:                                 CONSULTATION   HISTORY OF PRESENT ILLNESS:  This is a 65 year old with a history of  hypertension who presents with chest tightness.  She has had central  substernal chest tightness for 3 days now.  The discomfort started after  dinner about 3 days ago.  She thought may be she eaten too fast;  however, the chest pain remained present that evening and has not  resolved over the last 3 days.  The pain is not pleuritic.  The pain is  not reproducible by pushing on her chest.  At its worst, the pain is  7/10 and adequate yesterday evening and today.  When she lies down, the  pain decreases.  She has been able to go to work.  She works in  Public affairs consultant at [employer] and has actually had some fairly  strenuous work with mopping and sweeping, that does not make the pain  worse.  The pain does radiate at times to her left shoulder.  There is  no associated dyspnea.  The patient has no history of coronary artery  disease.  She does have a prior history of chest pain about a year ago  and workup was negative at that time.  She had an echocardiogram showing  mild LVH and EF of 65% and diastolic dysfunction.  At baseline, she has  dyspnea on exertion after climbing 1 flight of steps, but she can do  without trouble or any significant problems.  She is able to do her job  without chest pain or dyspnea on exertion.  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  There is history of syncope, no  lightheadedness.  Since she has been in the emergency department, the  pain has resolved.  Lying down in the ER bed, she received aspirin 324  mg and nitroglycerin paste,  she is not sure if that made the pain go  away or just lying down in the hospital bed made it go away.  I also  noted, the patient has been on recent long trips in the car or playing  and she does not have history of any clotting disorder in her or her  family.   PAST MEDICAL HISTORY:  1. Hypertension.  2. Arthritis.  3. Hysterectomy.  4. History of episode of chest pain in September 2008.  5. The patient did have transthoracic echocardiogram at that time      showing mild LVH, EF of 65% with a diastolic dysfunction and mild      aortic insufficiency.   ALLERGIES:  No known drug allergies.   EKG shows normal sinus rhythm and no ischemic ST-T wave changes.  No Q-  waves.  Chest x-ray shows stable cardiomegaly.  There is mild  central  airway thickening.  Labs today are significant for platelets of 259, and  hematocrit of 38.7.  sodium 140, potassium 4.1, BUN 14, creatinine 1.0,  glucose 87, INR 1.0, and BNP less than 30.  D-dimer is 0.49 which is  slightly elevated.  Urine-drug screen is negative.  Initial troponin is  less than 0.05, and CK-MB is 1.4.   MEDICATIONS:  1. Verapamil ER 180 mg b.i.d.  2. Aspirin 81 mg daily.  3. Benazepril 20 mg daily.  4. Tramadol p.r.n.   FAMILY HISTORY:  The patient's aunt had a myocardial infarction at the  age of 17.  The patient's mother has diabetes.  The patient sister has  hypertension.   SOCIAL HISTORY:  The patient's quit smoking about 3 years ago, before  that she smoked half-a-pack a day.  She uses no illicit drugs.  She  rarely drinks alcohol.  She works in Public affairs consultant at [employer].  She has 1 son.   REVIEW OF SYSTEMS:  Review of systems is negative except as noted in the  history of present illness.  Of note, the patient does not have pain in  her legs, when she walks.   PHYSICAL EXAMINATION:  VITALS:  Heart rate is 73 and regular.  Blood  pressure is 154/75.  Oxygen saturation is 97% on room air.  GENERAL:  No  apparent distress.  This is an obese female.  NEUROLOGIC:  Alert and oriented x3.  There is normal affect.  NECK:  JVP is 8 cm.  LUNGS:  Clear to auscultation bilaterally.  No wheezes.  Normal  respiratory effort.  HEART:  Regular S1 and S2.  There is a 2/6 crescendo-decrescendo early  systolic murmur at the right upper sternal border.  There is a soft S4.  There is no leg edema.  There are 2+ posterior tibial pulses  bilaterally.  There is no carotid bruits.  ABDOMEN:  Soft and nontender.  No hepatosplenomegaly.  Normal bowel  sounds.  EYE:  Normal.  EAR, NOSE, AND THROAT:  No significant abnormalities.  MUSCULOSKELETAL:  There is no pain with palpation over her chest wall,  otherwise normal.  SKIN:  There are no significant skin abnormalities.   ASSESSMENT AND PLAN:  This is a 65 year old with history of  hypertension, who presents with an episode of prolonged chest discomfort  for approximately 72 hours with initial negative cardiac enzymes.  1. Chest pain.  This is a prolonged episode of chest discomfort with      negative cardiac enzymes.  This is atypical for cardiac ischemia.      However, the patient is a passive smoker and has a history of      hypertension.  Of note, also her D-dimer is slightly elevated.  She      does deny recent long car trips or history of clotting disorder.      At this point, probably, we would rule out a pulmonary embolus      given the mildly elevated D-dimer and the chest pain is atypical      for cardiac ischemia with negative enzymes after prolonged chest      pain.  We would also, if there is no PE, rule out MI with serial      enzymes and serial EKGs.  If all are negative and the chest pain is      completely resolved, it will be okay to discharge this patient for  close outpatient followup.  We can arrange for a stress test at our      office tomorrow afternoon.  If there is recurrent chest pain, she      should have a stress test in the  hospital.  We will give aspirin      325 mg daily.  We will start Lopressor 25 mg q.8 h. especially      given that she is hypertensive.  We will also check her lipids.  We      would hold off on starting IV heparin at this point as her pain is      quite atypical.  2. Hypertension.  We would continue her on benazepril and we would add      Lopressor 25 mg q.8 h.      Marca Ancona, MD  Electronically Signed     DM/MEDQ  D:  01/14/2008  T:  01/15/2008  Job:  [id number]

## 2010-10-24 NOTE — Assessment & Plan Note (Signed)
[facility]                            CARDIOLOGY OFFICE NOTE   NAME:[name], Tanya [name]                   MRN:          [id number]  DATE:02/04/2008                            DOB:          [birth year]    PRIMARY CARE PHYSICIAN:  Merlene Laughter. Renae Gloss, MD   HISTORY OF PRESENT ILLNESS:  This is a 65 year old with history of  hypertension who is seen in followup after recent hospitalization in  [facility].  The patient was seen earlier in August in the  emergency department with chest discomfort radiating to her left arm and  on further questioning, it was found that she has had multiple episodes  of chest pain in the past and it was actually tended to be related to  food sticking in her esophagus when she ate and tended to be after  eating.  When the food finally pass, the pain would resolve.  She did  have an elevated D-dimer and a PECT was done in the emergency department  that was negative for pulmonary embolus.  She did have cardiac enzymes  that were negative x3.  She was seen by Gastroenterology and had an EGD  done during that hospitalization, which showed an esophageal stricture  as well as peptic ulcer disease.  The stricture was dilated and the  patient was started on proton pump inhibitor.  She was discharged home.  She states that since discharge, she has had no further chest pain and  food is no longer sticking in her throat.  She is quite active in  general.  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  She does not get  short of breath with exertion.  She does not get chest pain.  She is  able to climb steps without trouble.  She is staying away from  nonsteroidal anti-inflammatory drugs except for baby aspirin daily.  She  has had no episodes of syncope, presyncope, palpitations, orthopnea, or  PND.  EKG today is normal sinus rhythm, normal EKG.  Most recent  labs in  August 2009, showed a normal TSH, LDL 134, HDL 16, tryglyceride 66.   PAST MEDICAL HISTORY:  1. Hypertension.  2. Degenerative joint disease.  3. Echocardiogram in August 2008, showed mild LVH, EF 65%, mild aortic      insufficiency.  4. History of esophageal stricture status post dilation in August      2009.  5. Peptic ulcer disease.  6. Hypercholesterolemia.   SOCIAL HISTORY:  The patient lives by herself.  She has a grown son who  lives in [place].  She works in Public affairs consultant at [employer].  She quit smoking about 3 years ago.  She does not use any illicit  drugs.  She drinks alcohol rarely.   FAMILY HISTORY:  The patient's mother had type 2 diabetes and she does  not know any details on her father's medical history.   REVIEW OF SYSTEMS:  Negative except as noted in history of the present  illness.   MEDICATIONS:  1. Benazepril  20 mg daily.  2. Verapamil SR 180 mg b.i.d.  3. Aspirin 81 mg daily.  4. Lopressor 25 mg b.i.d.  5. Zocor 20 mg daily.  6. Protonix 40 mg daily.   PHYSICAL EXAMINATION:  VITAL SIGNS:  Blood pressure 148/91, heart rate  59, weight is 188 pounds.  GENERAL:  This is a well-developed female in no apparent distress.  Neurologically, alert and oriented x3.  Normal affect.  NECK:  No JVD.  No thyromegaly or nodule.  CARDIOVASCULAR:  Heart regular S1, S2.  No S3, no S4.  There is a soft  1/6 systolic crescendo-decrescendo murmur at the right upper sternal  border.  There is no peripheral edema.  There are 2+ posterior tibial  pulses bilaterally.  There is no carotid bruit.  LUNGS:  Clear to auscultation bilaterally.  ABDOMEN:  Soft, nontender.  No hepatosplenomegaly.  Bowel sounds are  present.  EXTREMITIES:  There is no clubbing, cyanosis.  HEENT:  Normal exam.  SKIN:  Normal exam.  MUSCULOSKELETAL:  Normal exam.   ASSESSMENT AND PLAN:  This is a 65 year old with history of hypertension  and probable noncardiac chest pain  who presents to Cardiology Clinic for  evaluation.  1. Chest pain.  This chest pain was likely gastrointestinal in cause      from peptic ulcer disease and esophageal stricture.  Since this has      been treated with proton pump inhibitor and esophageal dilation,      she has no further chest pain at all.  In fact, she has excellent      exercise tolerance.  She does have some risk factors for coronary      disease including hypertension and hypercholesterolemia and also      there was coronary calcium noted on her recent chest CT.  Given her      lack of significant symptomatology now and normal EKG, I think we      will just proceed with an exercise treadmill test without any      further imaging.  If this appears normal, I think we would just      proceed with medical treatment of risk factors.  We will continue      on her aspirin 81 mg daily.  2. Hyperlipidemia.  The patient is on Zocor 20 mg daily.  We will      check her cholesterol, her fasting lipid panel, and liver function      tests in 6 months.  3. Hypertension.  The patient's blood pressure is elevated today.  We      will stop her verapamil and start her instead on amlodipine 10 mg      daily and bring her back in 2 weeks for blood pressure check.     Marca Ancona, MD  Electronically Signed    DM/MedQ  DD: 02/04/2008  DT: 02/05/2008  Job #: [id number]   cc:   Merlene Laughter. Renae Gloss, M.D.  Noralyn Pick. Eden Emms, MD, [facility]

## 2011-02-05 ENCOUNTER — Ambulatory Visit (INDEPENDENT_AMBULATORY_CARE_PROVIDER_SITE_OTHER): Payer: Self-pay | Admitting: Internal Medicine

## 2011-02-05 ENCOUNTER — Encounter: Payer: Self-pay | Admitting: Internal Medicine

## 2011-02-05 DIAGNOSIS — Z0000 Encounter for general adult medical examination without abnormal findings: Secondary | ICD-10-CM

## 2011-02-05 DIAGNOSIS — I359 Nonrheumatic aortic valve disorder, unspecified: Secondary | ICD-10-CM

## 2011-02-05 DIAGNOSIS — I10 Essential (primary) hypertension: Secondary | ICD-10-CM

## 2011-02-05 DIAGNOSIS — I517 Cardiomegaly: Secondary | ICD-10-CM

## 2011-02-05 DIAGNOSIS — K222 Esophageal obstruction: Secondary | ICD-10-CM

## 2011-02-05 DIAGNOSIS — R12 Heartburn: Secondary | ICD-10-CM

## 2011-02-05 MED ORDER — BENAZEPRIL HCL 40 MG PO TABS: 40.0000 mg | ORAL_TABLET | Freq: Every day | ORAL | Status: DC

## 2011-02-05 MED ORDER — TRAMADOL HCL 50 MG PO TABS: 50.0000 mg | ORAL_TABLET | Freq: Four times a day (QID) | ORAL | Status: DC | PRN

## 2011-02-05 MED ORDER — OMEPRAZOLE 20 MG PO CPDR: 20.0000 mg | DELAYED_RELEASE_CAPSULE | Freq: Every day | ORAL | Status: DC

## 2011-02-05 MED ORDER — SIMVASTATIN 20 MG PO TABS: 20.0000 mg | ORAL_TABLET | Freq: Every day | ORAL | Status: DC

## 2011-02-05 MED ORDER — METOPROLOL SUCCINATE ER 25 MG PO TB24: 25.0000 mg | ORAL_TABLET | Freq: Every day | ORAL | Status: DC

## 2011-02-05 NOTE — Patient Instructions (Signed)
Please check your blood pressure on a regular basis.  If it is consistently greater than 150/90, please make an office appointment.  Limit your sodium (Salt) intake    It is important that you exercise regularly, at least 20 minutes 3 to 4 times per week.  If you develop chest pain or shortness of breath seek  medical attention.  Avoids foods high in acid such as tomatoes citrus juices, and spicy foods.  Avoid eating within two hours of lying down or before exercising.  Do not overheat.  Try smaller more frequent meals.  If symptoms persist, elevate the head of her bed 12 inches while sleeping.  Return in 6 months for follow-up

## 2011-02-05 NOTE — Progress Notes (Signed)
Subjective:    Patient ID: Tanya Gray, female    DOB: 1946-05-26, 65 y.o.   MRN: [id number]  HPI  Wt Readings from Last 3 Encounters:  02/05/11 168 lb (76.204 kg)  08/07/10 184 lb (83.462 kg)  07/13/10 183 lb (83.008 kg)  History of Present Illness:   65 year-old patient who is seen today for follow-up. She has a history of mild aortic insufficiency or megaly and hypertension. She retired. She has been on multiple antihypertensives. She has a history of dyspepsia, controlled on proton. No concerns or complaints today. She has a history of dyslipidemia, controlled on simvastatin.. She continues to tolerate well ;  she was seen 6 months ago and laboratory studies were performed at that time. She has been followed by cardiology for her aortic insufficiency. She denies any cardiopulmonary complaints. She has been compliant with her medications. There is been a nice voluntary weight loss over the past 6 months. She feels well today   Current Medications (verified):  1) Benazepril Hcl 20 Mg Tabs (Benazepril Hcl) .... One Tab By Mouth Qday  2) Adult Aspirin Ec Low Strength 81 Mg Tbec (Aspirin) .... One Tab By Mouth Qday  3) Tramadol Hcl 50 Mg Tabs (Tramadol Hcl) .... Take As Needed  4) Metoprolol Tartrate 25 Mg Tabs (Metoprolol Tartrate) .Marland Kitchen.. 1 Tab By Mouth Two Times A Day  5) Zocor 20 Mg Tabs (Simvastatin) .Marland Kitchen.. 1 Tab By Mouth Daily  6) Amlodipine Besylate 10 Mg Tabs (Amlodipine Besylate) .Marland Kitchen.. 1 Tab By Mouth Daily  7) Protonix 40 Mg Tbec (Pantoprazole Sodium) .Marland Kitchen.. 1 Once Daily  8) Calcium-Vitamin D 250-125 Mg-Unit Tabs (Calcium Carbonate-Vitamin D) .Marland Kitchen.. 1 Tab By Mouth Once Daily   Allergies (verified):  No Known Drug Allergies   Past History:  Past Medical History:  Reviewed history from 08/03/2009 and no changes required.  1. Hypertension.  2. Degenerative joint disease.  3. Echocardiogram, August 2008 which showed mild LVH, EF 65%, mild aortic insufficiency.  4. History of  esophageal stricture status post dilatation, August 2009.  5. Peptic ulcer disease.  6. Hypercholesteremia.  7. Atypical chest pain. Exercise treadmill test in September 2009. The patient exercised for 7 minutes 30 seconds. There was no evidence for ischemia by EKG.  8. Asthma  9. Diverticulosis, colon   Family History:  Reviewed history from 12/21/2008 and no changes required.  Mother (78)with DM II. HTN. No MI, cancer, lung/GI disease.  No FH of Colon Cancer:  Father- unknown status  One sister: died cerebral aneurysm   Review of Systems  Constitutional: Negative for fever, appetite change, fatigue and unexpected weight change.  HENT: Negative for hearing loss, ear pain, nosebleeds, congestion, sore throat, mouth sores, trouble swallowing, neck stiffness, dental problem, voice change, sinus pressure and tinnitus.   Eyes: Negative for photophobia, pain, redness and visual disturbance.  Respiratory: Negative for cough, chest tightness and shortness of breath.   Cardiovascular: Negative for chest pain, palpitations and leg swelling.  Gastrointestinal: Negative for nausea, vomiting, abdominal pain, diarrhea, constipation, blood in stool, abdominal distention and rectal pain.  Genitourinary: Negative for dysuria, urgency, frequency, hematuria, flank pain, vaginal bleeding, vaginal discharge, difficulty urinating, genital sores, vaginal pain, menstrual problem and pelvic pain.  Musculoskeletal: Negative for back pain and arthralgias.  Skin: Negative for rash.  Neurological: Negative for dizziness, syncope, speech difficulty, weakness, light-headedness, numbness and headaches.  Hematological: Negative for adenopathy. Does not bruise/bleed easily.  Psychiatric/Behavioral: Negative for suicidal ideas, behavioral problems, self-injury, dysphoric mood and agitation.  The patient is not nervous/anxious.        Objective:   Physical Exam  Constitutional: She is oriented to person, place, and  time. She appears well-developed and well-nourished.  HENT:  Head: Normocephalic and atraumatic.  Right Ear: External ear normal.  Left Ear: External ear normal.  Nose: Nose normal.  Mouth/Throat: Oropharynx is clear and moist.       Dentures in place  Eyes: Conjunctivae and EOM are normal. Pupils are equal, round, and reactive to light.  Neck: Normal range of motion. Neck supple. No JVD present. No thyromegaly present.  Cardiovascular: Normal rate, regular rhythm and intact distal pulses.   Murmur heard.      Grade 2/6 systolic murmur noted. Diastolic murmur of aortic insufficiency not appreciated  Pulmonary/Chest: Effort normal and breath sounds normal. She has no wheezes. She has no rales.  Abdominal: Soft. Bowel sounds are normal. She exhibits no distension and no mass. There is no tenderness. There is no rebound and no guarding.  Genitourinary: Vagina normal.  Musculoskeletal: Normal range of motion. She exhibits no edema and no tenderness.  Neurological: She is alert and oriented to person, place, and time. She has normal reflexes. No cranial nerve deficit. She exhibits normal muscle tone. Coordination normal.  Skin: Skin is warm and dry. No rash noted.  Psychiatric: She has a normal mood and affect. Her behavior is normal.          Assessment & Plan:   Preventive health Hypertension stable Mild aortic insufficiency Dyslipidemia Gastroesophageal reflux disease with history of stricture formation  We'll continue her present regimen and anti-reflux regimen Medications refilled  Recheck in 6 months

## 2011-02-13 ENCOUNTER — Telehealth: Payer: Self-pay | Admitting: Internal Medicine

## 2011-02-13 NOTE — Telephone Encounter (Signed)
ok 

## 2011-02-13 NOTE — Telephone Encounter (Signed)
Please advise 

## 2011-02-13 NOTE — Telephone Encounter (Signed)
Pt called and said that the new script for Metoprolol Succinate is too expensive. Pt req to change back to Metoprolol Tartrate 25 mg to [facility] on [address].

## 2011-02-14 MED ORDER — METOPROLOL TARTRATE 25 MG PO TABS: 25.0000 mg | ORAL_TABLET | Freq: Every day | ORAL | Status: DC

## 2011-02-14 NOTE — Telephone Encounter (Signed)
XL discontinued - new rx for plain metoprol 25mg  qd sent to [facility]

## 2011-03-09 LAB — RAPID URINE DRUG SCREEN, HOSP PERFORMED
Amphetamines: NOT DETECTED
Barbiturates: NOT DETECTED
Benzodiazepines: NOT DETECTED
Cocaine: NOT DETECTED
Opiates: NOT DETECTED
Tetrahydrocannabinol: NOT DETECTED

## 2011-03-09 LAB — POCT CARDIAC MARKERS
CKMB, poc: 1.1
CKMB, poc: 1.4
Myoglobin, poc: 63.3
Myoglobin, poc: 85.7
Troponin i, poc: <0.05
Troponin i, poc: <0.05

## 2011-03-09 LAB — COMPREHENSIVE METABOLIC PANEL
ALT: 16
AST: 16
Albumin: 3.5
Alkaline Phosphatase: 44
BUN: 14
CO2: 26
Calcium: 8.8
Chloride: 105
Creatinine, Ser: 0.64
GFR calc Af Amer: >60
GFR calc non Af Amer: >60
Glucose, Bld: 107 — ABNORMAL HIGH
Potassium: 3.4 — ABNORMAL LOW
Sodium: 139
Total Bilirubin: 0.6
Total Protein: 5.8 — ABNORMAL LOW

## 2011-03-09 LAB — CARDIAC PANEL(CRET KIN+CKTOT+MB+TROPI)
CK, MB: 1.4
CK, MB: 1.6
Relative Index: INVALID
Relative Index: INVALID
Total CK: 86
Total CK: 88
Troponin I: 0.01
Troponin I: 0.02

## 2011-03-09 LAB — CK TOTAL AND CKMB (NOT AT ARMC)
CK, MB: 1.9
Relative Index: 1.8
Total CK: 108

## 2011-03-09 LAB — CBC
HCT: 36.5
HCT: 38.7
Hemoglobin: 12.1
Hemoglobin: 12.7
MCHC: 32.9
MCHC: 33.3
MCV: 96.5
MCV: 96.9
Platelets: 254
Platelets: 259
RBC: 3.78 — ABNORMAL LOW
RBC: 3.99
RDW: 14.6
RDW: 14.8
WBC: 5.1
WBC: 5.7

## 2011-03-09 LAB — POCT I-STAT, CHEM 8
BUN: 14
Calcium, Ion: 1.21
Chloride: 105
Creatinine, Ser: 1
Glucose, Bld: 87
HCT: 41
Hemoglobin: 13.9
Potassium: 4.1
Sodium: 140
TCO2: 29

## 2011-03-09 LAB — BASIC METABOLIC PANEL
BUN: 4 — ABNORMAL LOW
CO2: 27
Calcium: 9.1
Chloride: 105
Creatinine, Ser: 0.7
GFR calc Af Amer: >60
GFR calc non Af Amer: >60
Glucose, Bld: 98
Potassium: 4
Sodium: 140

## 2011-03-09 LAB — DIFFERENTIAL
Basophils Absolute: 0.1
Basophils Relative: 1
Eosinophils Absolute: 0
Eosinophils Relative: 1
Lymphocytes Relative: 20
Lymphs Abs: 1.1
Monocytes Absolute: 0.4
Monocytes Relative: 7
Neutro Abs: 4.1
Neutrophils Relative %: 71

## 2011-03-09 LAB — LIPID PANEL
Cholesterol: 207 — ABNORMAL HIGH
HDL: 60
LDL Cholesterol: 134 — ABNORMAL HIGH
Total CHOL/HDL Ratio: 3.5
Triglycerides: 66
VLDL: 13

## 2011-03-09 LAB — PROTIME-INR
INR: 1
Prothrombin Time: 12.9

## 2011-03-09 LAB — URINE CULTURE: Colony Count: 8000

## 2011-03-09 LAB — D-DIMER, QUANTITATIVE: D-Dimer, Quant: 0.49 — ABNORMAL HIGH

## 2011-03-09 LAB — HEPATIC FUNCTION PANEL
ALT: 14
AST: 19
Albumin: 3.7
Alkaline Phosphatase: 47
Bilirubin, Direct: <0.1
Total Bilirubin: 0.7
Total Protein: 6.8

## 2011-03-09 LAB — URINALYSIS, ROUTINE W REFLEX MICROSCOPIC
Glucose, UA: NEGATIVE
Hgb urine dipstick: NEGATIVE
Ketones, ur: 40 — AB
Nitrite: NEGATIVE
Protein, ur: NEGATIVE
Specific Gravity, Urine: 1.029
Urobilinogen, UA: 1
pH: 6

## 2011-03-09 LAB — B-NATRIURETIC PEPTIDE (CONVERTED LAB): Pro B Natriuretic peptide (BNP): <30

## 2011-03-09 LAB — TROPONIN I: Troponin I: <0.01

## 2011-03-09 LAB — TSH: TSH: 0.877

## 2011-03-09 LAB — APTT: aPTT: 30

## 2011-03-23 LAB — I-STAT 8, (EC8 V) (CONVERTED LAB)
Acid-Base Excess: 5 — ABNORMAL HIGH
BUN: 17
Bicarbonate: 31.2 — ABNORMAL HIGH
Chloride: 99
Glucose, Bld: 100 — ABNORMAL HIGH
HCT: 41
Hemoglobin: 13.9
Operator id: 235561
Potassium: 3.5
Sodium: 135
TCO2: 33
pCO2, Ven: 48.6
pH, Ven: 7.414 — ABNORMAL HIGH

## 2011-03-23 LAB — POCT URINALYSIS DIP (DEVICE)
Glucose, UA: NEGATIVE
Hgb urine dipstick: NEGATIVE
Ketones, ur: NEGATIVE
Nitrite: NEGATIVE
Operator id: 235561
Protein, ur: NEGATIVE
Specific Gravity, Urine: >=1.03
Urobilinogen, UA: 1
pH: 5.5

## 2011-03-23 LAB — POCT I-STAT CREATININE
Creatinine, Ser: 1
Operator id: 235561

## 2011-05-23 LAB — HM MAMMOGRAPHY

## 2011-05-24 ENCOUNTER — Encounter: Payer: Self-pay | Admitting: Internal Medicine

## 2011-08-06 ENCOUNTER — Ambulatory Visit (INDEPENDENT_AMBULATORY_CARE_PROVIDER_SITE_OTHER): Payer: Self-pay | Admitting: Internal Medicine

## 2011-08-06 ENCOUNTER — Encounter: Payer: Self-pay | Admitting: Internal Medicine

## 2011-08-06 DIAGNOSIS — I10 Essential (primary) hypertension: Secondary | ICD-10-CM

## 2011-08-06 DIAGNOSIS — M1990 Unspecified osteoarthritis, unspecified site: Secondary | ICD-10-CM

## 2011-08-06 DIAGNOSIS — I359 Nonrheumatic aortic valve disorder, unspecified: Secondary | ICD-10-CM

## 2011-08-06 DIAGNOSIS — E785 Hyperlipidemia, unspecified: Secondary | ICD-10-CM

## 2011-08-06 MED ORDER — BENAZEPRIL HCL 40 MG PO TABS: 40.0000 mg | ORAL_TABLET | Freq: Every day | ORAL | Status: DC

## 2011-08-06 MED ORDER — TRAMADOL HCL 50 MG PO TABS: 50.0000 mg | ORAL_TABLET | Freq: Four times a day (QID) | ORAL | Status: DC | PRN

## 2011-08-06 MED ORDER — SIMVASTATIN 20 MG PO TABS: 20.0000 mg | ORAL_TABLET | Freq: Every day | ORAL | Status: DC

## 2011-08-06 MED ORDER — METOPROLOL TARTRATE 25 MG PO TABS: 25.0000 mg | ORAL_TABLET | Freq: Every day | ORAL | Status: DC

## 2011-08-06 NOTE — Progress Notes (Signed)
  Subjective:    Patient ID: Tanya Gray, female    DOB: [birth year], 66 y.o.   MRN: [id number]  HPI  Wt Readings from Last 3 Encounters:  08/06/11 167 lb (75.751 kg)  02/05/11 168 lb (76.204 kg)  08/07/10 184 lb (83.462 kg)    Review of Systems     Objective:   Physical Exam        Assessment & Plan:

## 2011-08-06 NOTE — Patient Instructions (Signed)

## 2011-08-06 NOTE — Progress Notes (Signed)
  Subjective:    Patient ID: Tanya Gray, female    DOB: 05/02/1946, 66 y.o.   MRN: [id number]  HPI  66 year old patient who is in today for followup. She has done quite well. No complaints today. She does have a history of hypertension and dyslipidemia. She also has a history of cardiomegaly and aortic insufficiency. She denies any cardiopulmonary complaints. Her weight is down 17 pounds over the past year and 1 pound since her last visit here 6 months ago. She feels well without concerns.    Review of Systems  Constitutional: Negative.   HENT: Negative for hearing loss, congestion, sore throat, rhinorrhea, dental problem, sinus pressure and tinnitus.   Eyes: Negative for pain, discharge and visual disturbance.  Respiratory: Negative for cough and shortness of breath.   Cardiovascular: Negative for chest pain, palpitations and leg swelling.  Gastrointestinal: Negative for nausea, vomiting, abdominal pain, diarrhea, constipation, blood in stool and abdominal distention.  Genitourinary: Negative for dysuria, urgency, frequency, hematuria, flank pain, vaginal bleeding, vaginal discharge, difficulty urinating, vaginal pain and pelvic pain.  Musculoskeletal: Negative for joint swelling, arthralgias and gait problem.  Skin: Negative for rash.  Neurological: Negative for dizziness, syncope, speech difficulty, weakness, numbness and headaches.  Hematological: Negative for adenopathy.  Psychiatric/Behavioral: Negative for behavioral problems, dysphoric mood and agitation. The patient is not nervous/anxious.        Objective:   Physical Exam  Constitutional: She is oriented to person, place, and time. She appears well-developed and well-nourished.  HENT:  Head: Normocephalic.  Right Ear: External ear normal.  Left Ear: External ear normal.  Mouth/Throat: Oropharynx is clear and moist.  Eyes: Conjunctivae and EOM are normal. Pupils are equal, round, and reactive to light.  Neck: Normal  range of motion. Neck supple. No thyromegaly present.  Cardiovascular: Normal rate, regular rhythm, normal heart sounds and intact distal pulses.        Grade 1-2/6 systolic murmur. No diastolic murmur of AI noted-  Pulmonary/Chest: Effort normal and breath sounds normal.  Abdominal: Soft. Bowel sounds are normal. She exhibits no mass. There is no tenderness.  Musculoskeletal: Normal range of motion.  Lymphadenopathy:    She has no cervical adenopathy.  Neurological: She is alert and oriented to person, place, and time.  Skin: Skin is warm and dry. No rash noted.  Psychiatric: She has a normal mood and affect. Her behavior is normal.          Assessment & Plan:   Hypertension well controlled Dyslipidemia  Medications refilled. We'll continue present regimen. Will continue efforts at weight loss low salt diet. Recheck 6 months

## 2012-01-29 ENCOUNTER — Other Ambulatory Visit (INDEPENDENT_AMBULATORY_CARE_PROVIDER_SITE_OTHER): Payer: Self-pay

## 2012-01-29 DIAGNOSIS — Z0000 Encounter for general adult medical examination without abnormal findings: Secondary | ICD-10-CM

## 2012-01-29 LAB — CBC WITH DIFFERENTIAL/PLATELET
Basophils Absolute: 0 10*3/uL (ref 0.0–0.1)
Basophils Relative: 0.5 % (ref 0.0–3.0)
Eosinophils Absolute: 0.1 10*3/uL (ref 0.0–0.7)
Eosinophils Relative: 2 % (ref 0.0–5.0)
HCT: 38.5 % (ref 36.0–46.0)
Hemoglobin: 12.6 g/dL (ref 12.0–15.0)
Lymphocytes Relative: 46 % (ref 12.0–46.0)
Lymphs Abs: 1.5 10*3/uL (ref 0.7–4.0)
MCHC: 32.8 g/dL (ref 30.0–36.0)
MCV: 97.3 fl (ref 78.0–100.0)
Monocytes Absolute: 0.4 10*3/uL (ref 0.1–1.0)
Monocytes Relative: 11.3 % (ref 3.0–12.0)
Neutro Abs: 1.3 10*3/uL — ABNORMAL LOW (ref 1.4–7.7)
Neutrophils Relative %: 40.2 % — ABNORMAL LOW (ref 43.0–77.0)
Platelets: 209 10*3/uL (ref 150.0–400.0)
RBC: 3.96 Mil/uL (ref 3.87–5.11)
RDW: 14.8 % — ABNORMAL HIGH (ref 11.5–14.6)
WBC: 3.4 10*3/uL — ABNORMAL LOW (ref 4.5–10.5)

## 2012-01-29 LAB — POCT URINALYSIS DIPSTICK
Bilirubin, UA: NEGATIVE
Blood, UA: NEGATIVE
Glucose, UA: NEGATIVE
Ketones, UA: NEGATIVE
Leukocytes, UA: NEGATIVE
Nitrite, UA: NEGATIVE
Protein, UA: NEGATIVE
Spec Grav, UA: 1.01
Urobilinogen, UA: 0.2
pH, UA: 6.5

## 2012-01-29 LAB — HEPATIC FUNCTION PANEL
ALT: 21 U/L (ref 0–35)
AST: 28 U/L (ref 0–37)
Albumin: 4 g/dL (ref 3.5–5.2)
Alkaline Phosphatase: 42 U/L (ref 39–117)
Bilirubin, Direct: 0.1 mg/dL (ref 0.0–0.3)
Total Bilirubin: 0.8 mg/dL (ref 0.3–1.2)
Total Protein: 6.8 g/dL (ref 6.0–8.3)

## 2012-01-29 LAB — BASIC METABOLIC PANEL
BUN: 14 mg/dL (ref 6–23)
CO2: 30 mEq/L (ref 19–32)
Calcium: 9.4 mg/dL (ref 8.4–10.5)
Chloride: 104 mEq/L (ref 96–112)
Creatinine, Ser: 0.8 mg/dL (ref 0.4–1.2)
GFR: 99.91 mL/min (ref >60.00)
Glucose, Bld: 99 mg/dL (ref 70–99)
Potassium: 3.8 mEq/L (ref 3.5–5.1)
Sodium: 140 mEq/L (ref 135–145)

## 2012-01-29 LAB — LIPID PANEL
Cholesterol: 156 mg/dL (ref 0–200)
HDL: 60.6 mg/dL (ref >39.00)
LDL Cholesterol: 79 mg/dL (ref 0–99)
Total CHOL/HDL Ratio: 3
Triglycerides: 82 mg/dL (ref 0.0–149.0)
VLDL: 16.4 mg/dL (ref 0.0–40.0)

## 2012-01-29 LAB — TSH: TSH: 2.74 u[IU]/mL (ref 0.35–5.50)

## 2012-02-06 ENCOUNTER — Ambulatory Visit (INDEPENDENT_AMBULATORY_CARE_PROVIDER_SITE_OTHER): Payer: Self-pay | Admitting: Internal Medicine

## 2012-02-06 ENCOUNTER — Encounter: Payer: Self-pay | Admitting: Internal Medicine

## 2012-02-06 VITALS — BP 160/100 | HR 60 | Temp 98.2°F | Resp 18 | Ht 63.5 in | Wt 171.0 lb

## 2012-02-06 DIAGNOSIS — K279 Peptic ulcer, site unspecified, unspecified as acute or chronic, without hemorrhage or perforation: Secondary | ICD-10-CM

## 2012-02-06 DIAGNOSIS — Z0000 Encounter for general adult medical examination without abnormal findings: Secondary | ICD-10-CM

## 2012-02-06 DIAGNOSIS — E785 Hyperlipidemia, unspecified: Secondary | ICD-10-CM

## 2012-02-06 DIAGNOSIS — I10 Essential (primary) hypertension: Secondary | ICD-10-CM

## 2012-02-06 DIAGNOSIS — K222 Esophageal obstruction: Secondary | ICD-10-CM

## 2012-02-06 DIAGNOSIS — M1990 Unspecified osteoarthritis, unspecified site: Secondary | ICD-10-CM

## 2012-02-06 MED ORDER — BENAZEPRIL HCL 40 MG PO TABS: 40.0000 mg | ORAL_TABLET | Freq: Every day | ORAL | Status: DC

## 2012-02-06 MED ORDER — AMLODIPINE BESYLATE 5 MG PO TABS: 5.0000 mg | ORAL_TABLET | Freq: Every day | ORAL | Status: DC

## 2012-02-06 MED ORDER — SIMVASTATIN 20 MG PO TABS: 20.0000 mg | ORAL_TABLET | Freq: Every day | ORAL | Status: DC

## 2012-02-06 MED ORDER — OMEPRAZOLE 20 MG PO CPDR: 20.0000 mg | DELAYED_RELEASE_CAPSULE | Freq: Every day | ORAL | Status: DC

## 2012-02-06 MED ORDER — METOPROLOL TARTRATE 25 MG PO TABS: 25.0000 mg | ORAL_TABLET | Freq: Every day | ORAL | Status: DC

## 2012-02-06 MED ORDER — METOPROLOL TARTRATE 25 MG PO TABS: ORAL_TABLET | ORAL | Status: DC

## 2012-02-06 MED ORDER — TRAMADOL HCL 50 MG PO TABS: 50.0000 mg | ORAL_TABLET | Freq: Four times a day (QID) | ORAL | Status: DC | PRN

## 2012-02-06 NOTE — Progress Notes (Signed)
Subjective:    Patient ID: Tanya Gray, female    DOB: March 21, 1946, 66 y.o.   MRN: [id number]  HPI  66 year old patient who is seen today for a preventive health examination. Medical problems include treated hypertension. She has been on triple therapy in the past which has included amlodipine. Apparently this was tapered and discontinued after some significant weight loss last year. Blood pressure today 160/100. She has a history of cardiomegaly and mild aortic insufficiency and is followed by cardiology EKG last year was normal. She denies any cardiopulmonary complaints. She does have some osteoarthritis. She has a history of esophageal stricture and remains on chronic PPI therapy. No real concerns or complaints today she remains on simvastatin for dyslipidemia which he continues to tolerate well.  Past Medical History  Diagnosis Date  . AORTIC INSUFFICIENCY 08/03/2009  . Cardiomegaly 01/29/2007  . Diverticulosis of colon (without mention of hemorrhage) 08/08/2006  . ESOPHAGEAL STRICTURE 02/19/2008  . Heartburn 02/19/2008  . HYPERLIPIDEMIA 02/18/2008  . HYPERTENSION, BENIGN SYSTEMIC 08/08/2006  . MYALGIA 01/29/2007  . OSTEOARTHRITIS 12/21/2008  . PEPTIC ULCER DISEASE 12/21/2008  . RHINITIS, ALLERGIC 08/08/2006  . Unspecified asthma 08/08/2006    History   Social History  . Marital Status: Single    Spouse Name: N/A    Number of Children: N/A  . Years of Education: N/A   Occupational History  . Not on file.   Social History Main Topics  . Smoking status: Former Smoker    Quit date: 06/12/2003  . Smokeless tobacco: Never Used  . Alcohol Use: 0.0 oz/week    1-2 Cans of beer per week  . Drug Use: Not on file  . Sexually Active: Not on file   Other Topics Concern  . Not on file   Social History Narrative  . No narrative on file    Past Surgical History  Procedure Date  . Abdominal hysterectomy   . Esophagogastroduodenoscopy   . Endotracheal intubation emergent     Family  History  Problem Relation Age of Onset  . Diabetes Mother   . Hypertension Mother   . Lung disease Mother   . Aneurysm Sister     cerebral  . Colon cancer Neg Hx     No Known Allergies  Current Outpatient Prescriptions on File Prior to Visit  Medication Sig Dispense Refill  . aspirin 81 MG tablet Take 81 mg by mouth daily.        . benazepril (LOTENSIN) 40 MG tablet Take 1 tablet (40 mg total) by mouth daily.  90 tablet  6  . calcium-vitamin D (OSCAL) 250-125 MG-UNIT per tablet Take 1 tablet by mouth daily.        . metoprolol tartrate (LOPRESSOR) 25 MG tablet Take 1 tablet (25 mg total) by mouth daily.  90 tablet  3  . omeprazole (PRILOSEC) 20 MG capsule Take 1 capsule (20 mg total) by mouth daily.  90 capsule  6  . simvastatin (ZOCOR) 20 MG tablet Take 1 tablet (20 mg total) by mouth at bedtime.  90 tablet  6  . traMADol (ULTRAM) 50 MG tablet Take 1 tablet (50 mg total) by mouth every 6 (six) hours as needed.  60 tablet  4    BP 160/100  Pulse 60  Temp 98.2 F (36.8 C) (Oral)  Resp 18  Ht 5' 3.5" (1.613 m)  Wt 171 lb (77.565 kg)  BMI 29.82 kg/m2  SpO2 95%      Review of Systems  Constitutional:  Negative for fever, appetite change, fatigue and unexpected weight change.  HENT: Negative for hearing loss, ear pain, nosebleeds, congestion, sore throat, mouth sores, trouble swallowing, neck stiffness, dental problem, voice change, sinus pressure and tinnitus.   Eyes: Negative for photophobia, pain, redness and visual disturbance.  Respiratory: Negative for cough, chest tightness and shortness of breath.   Cardiovascular: Negative for chest pain, palpitations and leg swelling.  Gastrointestinal: Negative for nausea, vomiting, abdominal pain, diarrhea, constipation, blood in stool, abdominal distention and rectal pain.  Genitourinary: Negative for dysuria, urgency, frequency, hematuria, flank pain, vaginal bleeding, vaginal discharge, difficulty urinating, genital sores, vaginal  pain, menstrual problem and pelvic pain.  Musculoskeletal: Positive for back pain and arthralgias.  Skin: Negative for rash.  Neurological: Negative for dizziness, syncope, speech difficulty, weakness, light-headedness, numbness and headaches.  Hematological: Negative for adenopathy. Does not bruise/bleed easily.  Psychiatric/Behavioral: Negative for suicidal ideas, behavioral problems, self-injury, dysphoric mood and agitation. The patient is not nervous/anxious.        Objective:   Physical Exam  Constitutional: She is oriented to person, place, and time. She appears well-developed and well-nourished.       Blood pressure 160/100 both arms  HENT:  Head: Normocephalic and atraumatic.  Right Ear: External ear normal.  Left Ear: External ear normal.  Mouth/Throat: Oropharynx is clear and moist.  Eyes: Conjunctivae and EOM are normal.  Neck: Normal range of motion. Neck supple. No JVD present. No thyromegaly present.  Cardiovascular: Normal rate, regular rhythm, normal heart sounds and intact distal pulses.   No murmur heard.      Grade 1/6 systolic murmur at the primary aortic area. No murmur of AI appreciated  Pulmonary/Chest: Effort normal and breath sounds normal. She has no wheezes. She has no rales.  Abdominal: Soft. Bowel sounds are normal. She exhibits no distension and no mass. There is no tenderness. There is no rebound and no guarding.  Musculoskeletal: Normal range of motion. She exhibits no edema and no tenderness.  Neurological: She is alert and oriented to person, place, and time. She has normal reflexes. No cranial nerve deficit. She exhibits normal muscle tone. Coordination normal.  Skin: Skin is warm and dry. No rash noted.  Psychiatric: She has a normal mood and affect. Her behavior is normal.          Assessment & Plan:   Preventive health exam Hypertension suboptimal control. We'll resume amlodipine 5 mg daily. Has been on as high a dose as 10 mg in the past.  Home blood pressure monitor and encouraged we'll recheck in 4 weeks Aortic insufficiency. This appears to be trivial no murmur of AI noted today History of esophageal stricture. We'll continue PPI therapy Dyslipidemia stable

## 2012-02-06 NOTE — Patient Instructions (Signed)
Limit your sodium (Salt) intake  Please check your blood pressure on a regular basis.  If it is consistently greater than 150/90, please make an office appointment.  You need to lose weight.  Consider a lower calorie diet and regular exercise.    It is important that you exercise regularly, at least 20 minutes 3 to 4 times per week.  If you develop chest pain or shortness of breath seek  medical attention.  Return in one month for follow-up

## 2012-03-05 ENCOUNTER — Ambulatory Visit (INDEPENDENT_AMBULATORY_CARE_PROVIDER_SITE_OTHER): Payer: Self-pay | Admitting: Internal Medicine

## 2012-03-05 ENCOUNTER — Encounter: Payer: Self-pay | Admitting: Internal Medicine

## 2012-03-05 VITALS — BP 142/90 | Wt 167.0 lb

## 2012-03-05 DIAGNOSIS — I10 Essential (primary) hypertension: Secondary | ICD-10-CM

## 2012-03-05 DIAGNOSIS — Z23 Encounter for immunization: Secondary | ICD-10-CM

## 2012-03-05 MED ORDER — AMLODIPINE BESYLATE 10 MG PO TABS: 10.0000 mg | ORAL_TABLET | Freq: Every day | ORAL | Status: DC

## 2012-03-05 NOTE — Patient Instructions (Signed)

## 2012-03-05 NOTE — Progress Notes (Signed)
  Subjective:    Patient ID: Tanya Gray, female    DOB: February 26, 1946, 66 y.o.   MRN: [id number]  HPI  66 year old patient who is seen today for followup of hypertension. One month ago metoprolol increased to 50 mg daily and amlodipine resumed at a dose of 5 mg daily. She's also on Lotensin 40 mg daily.  BP Readings from Last 3 Encounters:  03/05/12 142/90  02/06/12 160/100  08/06/11 120/74    Wt Readings from Last 3 Encounters:  03/05/12 167 lb (75.751 kg)  02/06/12 171 lb (77.565 kg)  08/06/11 167 lb (75.751 kg)       Review of Systems  Constitutional: Negative.   HENT: Negative for hearing loss, congestion, sore throat, rhinorrhea, dental problem, sinus pressure and tinnitus.   Eyes: Negative for pain, discharge and visual disturbance.  Respiratory: Negative for cough and shortness of breath.   Cardiovascular: Negative for chest pain, palpitations and leg swelling.  Gastrointestinal: Negative for nausea, vomiting, abdominal pain, diarrhea, constipation, blood in stool and abdominal distention.  Genitourinary: Negative for dysuria, urgency, frequency, hematuria, flank pain, vaginal bleeding, vaginal discharge, difficulty urinating, vaginal pain and pelvic pain.  Musculoskeletal: Negative for joint swelling, arthralgias and gait problem.  Skin: Negative for rash.  Neurological: Negative for dizziness, syncope, speech difficulty, weakness, numbness and headaches.  Hematological: Negative for adenopathy.  Psychiatric/Behavioral: Negative for behavioral problems, dysphoric mood and agitation. The patient is not nervous/anxious.        Objective:   Physical Exam  Constitutional:       Blood pressure 140/90          Assessment & Plan:   Hypertension improved. We'll increase amlodipine to 10 mg daily Recheck 3 months Low-salt diet recommended Final weight loss recommended Home pressure monitoring encouraged

## 2012-05-29 LAB — HM MAMMOGRAPHY: HM Mammogram: NEGATIVE

## 2012-05-30 ENCOUNTER — Encounter: Payer: Self-pay | Admitting: Internal Medicine

## 2012-05-30 ENCOUNTER — Ambulatory Visit (INDEPENDENT_AMBULATORY_CARE_PROVIDER_SITE_OTHER): Payer: Self-pay | Admitting: Internal Medicine

## 2012-05-30 VITALS — BP 160/100 | HR 63 | Temp 98.2°F | Resp 18 | Wt 166.0 lb

## 2012-05-30 DIAGNOSIS — E785 Hyperlipidemia, unspecified: Secondary | ICD-10-CM

## 2012-05-30 DIAGNOSIS — I10 Essential (primary) hypertension: Secondary | ICD-10-CM

## 2012-05-30 DIAGNOSIS — I517 Cardiomegaly: Secondary | ICD-10-CM

## 2012-05-30 MED ORDER — BENAZEPRIL HCL 40 MG PO TABS: 40.0000 mg | ORAL_TABLET | Freq: Every day | ORAL | Status: DC

## 2012-05-30 MED ORDER — HYDROCHLOROTHIAZIDE 25 MG PO TABS: 25.0000 mg | ORAL_TABLET | Freq: Every day | ORAL | Status: DC

## 2012-05-30 MED ORDER — AMLODIPINE BESYLATE 10 MG PO TABS: 10.0000 mg | ORAL_TABLET | Freq: Every day | ORAL | Status: DC

## 2012-05-30 MED ORDER — SIMVASTATIN 20 MG PO TABS: 20.0000 mg | ORAL_TABLET | Freq: Every day | ORAL | Status: DC

## 2012-05-30 MED ORDER — METOPROLOL TARTRATE 25 MG PO TABS: 25.0000 mg | ORAL_TABLET | Freq: Every day | ORAL | Status: DC

## 2012-05-30 NOTE — Progress Notes (Signed)
Subjective:    Patient ID: Tanya Gray, female    DOB: 09-18-1945, 66 y.o.   MRN: [id number]  HPI  66 year old patient who is seen today for followup of her hypertension. She does monitor home blood pressure readings with occasional high readings blood pressure arrival here today 160/100. Followup blood pressure later in the course of the exam 140/80. She generally feels well.  Past Medical History  Diagnosis Date  . AORTIC INSUFFICIENCY 08/03/2009  . Cardiomegaly 01/29/2007  . Diverticulosis of colon (without mention of hemorrhage) 08/08/2006  . ESOPHAGEAL STRICTURE 02/19/2008  . Heartburn 02/19/2008  . HYPERLIPIDEMIA 02/18/2008  . HYPERTENSION, BENIGN SYSTEMIC 08/08/2006  . MYALGIA 01/29/2007  . OSTEOARTHRITIS 12/21/2008  . PEPTIC ULCER DISEASE 12/21/2008  . RHINITIS, ALLERGIC 08/08/2006  . Unspecified asthma 08/08/2006    History   Social History  . Marital Status: Single    Spouse Name: N/A    Number of Children: N/A  . Years of Education: N/A   Occupational History  . Not on file.   Social History Main Topics  . Smoking status: Former Smoker    Quit date: 06/12/2003  . Smokeless tobacco: Never Used  . Alcohol Use: 0.0 oz/week    1-2 Cans of beer per week  . Drug Use: Not on file  . Sexually Active: Not on file   Other Topics Concern  . Not on file   Social History Narrative  . No narrative on file    Past Surgical History  Procedure Date  . Abdominal hysterectomy   . Esophagogastroduodenoscopy   . Endotracheal intubation emergent     Family History  Problem Relation Age of Onset  . Diabetes Mother   . Hypertension Mother   . Lung disease Mother   . Aneurysm Sister     cerebral  . Colon cancer Neg Hx     No Known Allergies  Current Outpatient Prescriptions on File Prior to Visit  Medication Sig Dispense Refill  . amLODipine (NORVASC) 10 MG tablet Take 1 tablet (10 mg total) by mouth daily.  90 tablet  3  . aspirin 81 MG tablet Take 81 mg by mouth  daily.        . benazepril (LOTENSIN) 40 MG tablet Take 1 tablet (40 mg total) by mouth daily.  90 tablet  6  . calcium-vitamin D (OSCAL) 250-125 MG-UNIT per tablet Take 1 tablet by mouth daily.        . metoprolol tartrate (LOPRESSOR) 25 MG tablet Take 25 mg by mouth daily. 1 tablet twice daily      . omeprazole (PRILOSEC) 20 MG capsule Take 1 capsule (20 mg total) by mouth daily.  90 capsule  6  . simvastatin (ZOCOR) 20 MG tablet Take 1 tablet (20 mg total) by mouth at bedtime.  90 tablet  6  . traMADol (ULTRAM) 50 MG tablet Take 1 tablet (50 mg total) by mouth every 6 (six) hours as needed.  60 tablet  4    BP 160/100  Pulse 63  Temp 98.2 F (36.8 C) (Oral)  Resp 18  Wt 166 lb (75.297 kg)  SpO2 95%      Review of Systems  Constitutional: Negative.   HENT: Negative for hearing loss, congestion, sore throat, rhinorrhea, dental problem, sinus pressure and tinnitus.   Eyes: Negative for pain, discharge and visual disturbance.  Respiratory: Negative for cough and shortness of breath.   Cardiovascular: Negative for chest pain, palpitations and leg swelling.  Gastrointestinal: Negative for nausea,  vomiting, abdominal pain, diarrhea, constipation, blood in stool and abdominal distention.  Genitourinary: Negative for dysuria, urgency, frequency, hematuria, flank pain, vaginal bleeding, vaginal discharge, difficulty urinating, vaginal pain and pelvic pain.  Musculoskeletal: Negative for joint swelling, arthralgias and gait problem.  Skin: Negative for rash.  Neurological: Negative for dizziness, syncope, speech difficulty, weakness, numbness and headaches.  Hematological: Negative for adenopathy.  Psychiatric/Behavioral: Negative for behavioral problems, dysphoric mood and agitation. The patient is not nervous/anxious.        Objective:   Physical Exam  Constitutional: She is oriented to person, place, and time. She appears well-developed and well-nourished.  HENT:  Head:  Normocephalic.  Right Ear: External ear normal.  Left Ear: External ear normal.  Mouth/Throat: Oropharynx is clear and moist.  Eyes: Conjunctivae normal and EOM are normal. Pupils are equal, round, and reactive to light.  Neck: Normal range of motion. Neck supple. No thyromegaly present.  Cardiovascular: Normal rate, regular rhythm, normal heart sounds and intact distal pulses.   Pulmonary/Chest: Effort normal and breath sounds normal.  Abdominal: Soft. Bowel sounds are normal. She exhibits no mass. There is no tenderness.  Musculoskeletal: Normal range of motion.  Lymphadenopathy:    She has no cervical adenopathy.  Neurological: She is alert and oriented to person, place, and time.  Skin: Skin is warm and dry. No rash noted.  Psychiatric: She has a normal mood and affect. Her behavior is normal.          Assessment & Plan:   Hypertension. Probably still not optimal control with periodic high readings will add HCTZ 25 mg daily to her regimen We'll continue home blood pressure monitoring low-salt diet Dyslipidemia  Recheck 3 months

## 2012-05-30 NOTE — Progress Notes (Signed)
  Subjective:    Patient ID: Tanya Gray, female    DOB: 1945/08/31, 66 y.o.   MRN: [id number]  HPI  Wt Readings from Last 3 Encounters:  05/30/12 166 lb (75.297 kg)  03/05/12 167 lb (75.751 kg)  02/06/12 171 lb (77.565 kg)    BP Readings from Last 3 Encounters:  05/30/12 160/100  03/05/12 142/90  02/06/12 160/100    Review of Systems     Objective:   Physical Exam        Assessment & Plan:

## 2012-05-30 NOTE — Patient Instructions (Signed)

## 2012-06-09 ENCOUNTER — Encounter: Payer: Self-pay | Admitting: Internal Medicine

## 2012-06-10 ENCOUNTER — Telehealth: Payer: Self-pay | Admitting: Internal Medicine

## 2012-06-10 NOTE — Telephone Encounter (Signed)
Pt disregard previous message. Pt will go to [facility] and get drug/price listing for bp and chole med

## 2012-06-10 NOTE — Telephone Encounter (Signed)
Pt can know longer afford amlodipine 10 mg #90 cost 78.00. Simvastatin 20 mg #90 cost 58.00. Pt does not have any Ins. [facility] [address]  please advise

## 2012-06-12 ENCOUNTER — Telehealth: Payer: Self-pay | Admitting: Internal Medicine

## 2012-06-12 NOTE — Telephone Encounter (Signed)
Left message on voicemail.

## 2012-06-12 NOTE — Telephone Encounter (Signed)
Please call pharm back to clarify SIG on Metoprolol.

## 2012-06-12 NOTE — Telephone Encounter (Signed)
Pt  called back, asked her how much Metoprolol she is taking? Pt stated she is taking one tablet twice a day. Told pt okay pharmacy needed clarification. [facility] called and given clarification one tablet twice daily.

## 2012-06-13 ENCOUNTER — Other Ambulatory Visit: Payer: Self-pay | Admitting: *Deleted

## 2012-06-13 MED ORDER — PRAVASTATIN SODIUM 40 MG PO TABS: 40.0000 mg | ORAL_TABLET | Freq: Every day | ORAL | Status: DC

## 2012-06-13 MED ORDER — VERAPAMIL HCL 120 MG PO TABS: 120.0000 mg | ORAL_TABLET | Freq: Two times a day (BID) | ORAL | Status: DC

## 2012-06-13 NOTE — Telephone Encounter (Signed)
Spoke to pt told her new Rx's prescribed from the $ 4.00 list, Pravastatin 40 mg daily, and Verapamil 120 mg twice a day. Pt verbalized understanding. Told pt will send to pharmacy. Rx's sent.

## 2012-06-21 ENCOUNTER — Emergency Department (HOSPITAL_COMMUNITY): Payer: Self-pay

## 2012-06-21 ENCOUNTER — Emergency Department (HOSPITAL_COMMUNITY)
Admission: EM | Admit: 2012-06-21 | Discharge: 2012-06-21 | Disposition: A | Discharge status: Home / Self Care | Payer: Self-pay | Attending: Emergency Medicine | Admitting: Emergency Medicine

## 2012-06-21 ENCOUNTER — Encounter (HOSPITAL_COMMUNITY): Payer: Self-pay | Admitting: Nurse Practitioner

## 2012-06-21 DIAGNOSIS — J45909 Unspecified asthma, uncomplicated: Secondary | ICD-10-CM | POA: Insufficient documentation

## 2012-06-21 DIAGNOSIS — Z87891 Personal history of nicotine dependence: Secondary | ICD-10-CM | POA: Insufficient documentation

## 2012-06-21 DIAGNOSIS — Z79899 Other long term (current) drug therapy: Secondary | ICD-10-CM | POA: Insufficient documentation

## 2012-06-21 DIAGNOSIS — M1990 Unspecified osteoarthritis, unspecified site: Secondary | ICD-10-CM | POA: Insufficient documentation

## 2012-06-21 DIAGNOSIS — Z8679 Personal history of other diseases of the circulatory system: Secondary | ICD-10-CM | POA: Insufficient documentation

## 2012-06-21 DIAGNOSIS — Z7982 Long term (current) use of aspirin: Secondary | ICD-10-CM | POA: Insufficient documentation

## 2012-06-21 DIAGNOSIS — E785 Hyperlipidemia, unspecified: Secondary | ICD-10-CM | POA: Insufficient documentation

## 2012-06-21 DIAGNOSIS — Z8719 Personal history of other diseases of the digestive system: Secondary | ICD-10-CM | POA: Insufficient documentation

## 2012-06-21 DIAGNOSIS — R079 Chest pain, unspecified: Secondary | ICD-10-CM

## 2012-06-21 DIAGNOSIS — R51 Headache: Secondary | ICD-10-CM | POA: Insufficient documentation

## 2012-06-21 DIAGNOSIS — R0602 Shortness of breath: Secondary | ICD-10-CM | POA: Insufficient documentation

## 2012-06-21 DIAGNOSIS — I10 Essential (primary) hypertension: Secondary | ICD-10-CM | POA: Insufficient documentation

## 2012-06-21 DIAGNOSIS — K222 Esophageal obstruction: Secondary | ICD-10-CM | POA: Insufficient documentation

## 2012-06-21 DIAGNOSIS — R0789 Other chest pain: Secondary | ICD-10-CM | POA: Insufficient documentation

## 2012-06-21 DIAGNOSIS — Z8739 Personal history of other diseases of the musculoskeletal system and connective tissue: Secondary | ICD-10-CM | POA: Insufficient documentation

## 2012-06-21 LAB — BASIC METABOLIC PANEL
BUN: 21 mg/dL (ref 6–23)
CO2: 29 mEq/L (ref 19–32)
Calcium: 9.6 mg/dL (ref 8.4–10.5)
Chloride: 98 mEq/L (ref 96–112)
Creatinine, Ser: 1.03 mg/dL (ref 0.50–1.10)
GFR calc Af Amer: 65 mL/min — ABNORMAL LOW (ref >90)
GFR calc non Af Amer: 56 mL/min — ABNORMAL LOW (ref >90)
Glucose, Bld: 94 mg/dL (ref 70–99)
Potassium: 4.1 mEq/L (ref 3.5–5.1)
Sodium: 139 mEq/L (ref 135–145)

## 2012-06-21 LAB — CBC
HCT: 39.8 % (ref 36.0–46.0)
Hemoglobin: 13.7 g/dL (ref 12.0–15.0)
MCH: 33.1 pg (ref 26.0–34.0)
MCHC: 34.4 g/dL (ref 30.0–36.0)
MCV: 96.1 fL (ref 78.0–100.0)
Platelets: 221 K/uL (ref 150–400)
RBC: 4.14 MIL/uL (ref 3.87–5.11)
RDW: 12.4 % (ref 11.5–15.5)
WBC: 6 K/uL (ref 4.0–10.5)

## 2012-06-21 LAB — POCT I-STAT TROPONIN I: Troponin i, poc: 0 ng/mL (ref 0.00–0.08)

## 2012-06-21 LAB — PRO B NATRIURETIC PEPTIDE: Pro B Natriuretic peptide (BNP): 119.6 pg/mL (ref 0–125)

## 2012-06-21 MED ORDER — SUCRALFATE 1 GM/10ML PO SUSP: 1.0000 g | Freq: Four times a day (QID) | ORAL | Status: DC | PRN

## 2012-06-21 NOTE — ED Notes (Signed)
Pt states over the past week she has been having SOB, diaphoresis, lightheadness, and dizziness. Also states she is having central chest pain described as heaviness and pressure. Pt states was concerned she may have had a heart attack.

## 2012-06-21 NOTE — ED Notes (Signed)
Dr.Lockwood at bedside  

## 2012-06-21 NOTE — ED Notes (Signed)
Patient transported to X-ray 

## 2012-06-21 NOTE — ED Provider Notes (Signed)
History     CSN: [id number]  Arrival date & time 06/21/12  1252   First MD Initiated Contact with Patient 06/21/12 1446      Chief Complaint  Patient presents with  . Chest Pain  . Shortness of Breath    (Consider location/radiation/quality/duration/timing/severity/associated sxs/prior treatment) HPI The patient presents with concerns of one week of chest pain.  The pain is pressure-like, focally about the sternum.  There are no clear precipitating, alleviating, exacerbating factors, though the pain seems to be more pronounced when the patient sits upright.  There is no associated pleuritic discomfort, no exertional worsening, no syncope, lightheadedness. The pain does not radiate. The patient continues to eat and denies significant nausea, anorexia, any diarrhea. She states that the headache is dissimilar to her heartburn.  She has a notable history of esophageal stricture, as well as aortic insufficiency. She has no history of myocardial infarction.   She states that during the exam, and on arrival to the emergency department, she has no chest pain or any ongoing current complaints.  Past Medical History  Diagnosis Date  . AORTIC INSUFFICIENCY 08/03/2009  . Cardiomegaly 01/29/2007  . Diverticulosis of colon (without mention of hemorrhage) 08/08/2006  . ESOPHAGEAL STRICTURE 02/19/2008  . Heartburn 02/19/2008  . HYPERLIPIDEMIA 02/18/2008  . HYPERTENSION, BENIGN SYSTEMIC 08/08/2006  . MYALGIA 01/29/2007  . OSTEOARTHRITIS 12/21/2008  . PEPTIC ULCER DISEASE 12/21/2008  . RHINITIS, ALLERGIC 08/08/2006  . Unspecified asthma 08/08/2006    Past Surgical History  Procedure Date  . Abdominal hysterectomy   . Esophagogastroduodenoscopy   . Endotracheal intubation emergent     Family History  Problem Relation Age of Onset  . Diabetes Mother   . Hypertension Mother   . Lung disease Mother   . Aneurysm Sister     cerebral  . Colon cancer Neg Hx     History  Substance Use Topics    . Smoking status: Former Smoker    Quit date: 06/12/2003  . Smokeless tobacco: Never Used  . Alcohol Use: 0.0 oz/week    1-2 Cans of beer per week    OB History    Grav Para Term Preterm Abortions TAB SAB Ect Mult Living                  Review of Systems  Constitutional:       Per HPI, otherwise negative  HENT:       Per HPI, otherwise negative  Eyes: Negative.   Respiratory:       Per HPI, otherwise negative  Cardiovascular:       Per HPI, otherwise negative  Gastrointestinal: Negative for vomiting.  Genitourinary: Negative.   Musculoskeletal:       Per HPI, otherwise negative  Skin: Negative.   Neurological: Negative for syncope.    Allergies  Review of patient's allergies indicates no known allergies.  Home Medications   Current Outpatient Rx  Name  Route  Sig  Dispense  Refill  . ASPIRIN 81 MG PO TABS   Oral   Take 81 mg by mouth daily.           Marland Kitchen BENAZEPRIL HCL 40 MG PO TABS   Oral   Take 40 mg by mouth every morning.         Marland Kitchen CALCIUM-VITAMIN D 250-125 MG-UNIT PO TABS   Oral   Take 1 tablet by mouth daily.           Marland Kitchen HYDROCHLOROTHIAZIDE 25 MG PO TABS  Oral   Take 25 mg by mouth every morning.         Marland Kitchen METOPROLOL TARTRATE 25 MG PO TABS   Oral   Take 25 mg by mouth daily. 1 tablet twice daily         . OMEPRAZOLE 20 MG PO CPDR   Oral   Take 20 mg by mouth every morning.         Marland Kitchen PRAVASTATIN SODIUM 40 MG PO TABS   Oral   Take 1 tablet (40 mg total) by mouth daily.   90 tablet   3   . TRAMADOL HCL 50 MG PO TABS   Oral   Take 50 mg by mouth every 6 (six) hours as needed. As needed for pain.         Marland Kitchen VERAPAMIL HCL 120 MG PO TABS   Oral   Take 1 tablet (120 mg total) by mouth 2 (two) times daily.   180 tablet   3     BP 116/65  Pulse 50  Temp 98.9 F (37.2 C) (Oral)  Resp 14  SpO2 98%  Physical Exam  Nursing note and vitals reviewed. Constitutional: She is oriented to person, place, and time. She appears  well-developed and well-nourished. No distress.  HENT:  Head: Normocephalic and atraumatic.  Eyes: Conjunctivae normal and EOM are normal.  Cardiovascular: Normal rate and regular rhythm.   Pulmonary/Chest: Effort normal and breath sounds normal. No stridor. No respiratory distress.  Abdominal: She exhibits no distension.  Musculoskeletal: She exhibits no edema.  Neurological: She is alert and oriented to person, place, and time. No cranial nerve deficit.  Skin: Skin is warm and dry.  Psychiatric: She has a normal mood and affect.    ED Course  Procedures (including critical care time)  Labs Reviewed  BASIC METABOLIC PANEL - Abnormal; Notable for the following:    GFR calc non Af Amer 56 (*)     GFR calc Af Amer 65 (*)     All other components within normal limits  CBC  PRO B NATRIURETIC PEPTIDE  POCT I-STAT TROPONIN I   Dg Chest 2 View  06/21/2012  *RADIOLOGY REPORT*  Clinical Data: Chest pain, shortness of breath  CHEST - 2 VIEW  Comparison: 01/14/2008  Findings: Stable mild cardiac enlargement but normal vascularity. Negative CHF, pneumonia, effusion or pneumothorax.  Trachea is midline.  Mild thoracic spondylosis.  IMPRESSION: Stable exam.  No acute process   Original Report Authenticated By: Judie Petit. Miles Costain, M.D.      No diagnosis found.  Cardiac 55 sinus bradycardia abnormal Pulse ox 99% room air normal    Date: 06/21/2012  Rate: 52  Rhythm: sinus bradycardia  QRS Axis: normal  Intervals: normal  ST/T Wave abnormalities: normal  Conduction Disutrbances:none  Narrative Interpretation:   Old EKG Reviewed: none available BORDERLINE   MDM  This patient with one week of symptoms now presents with concerns of the discomfort.  On exam the patient is asymptomatic, denies any ongoing complaints throughout her ED stay. Given the absence of known CAD, the nonischemic ECG, the negative troponin, in spite of 1 week of symptoms, there is low suspicion for unstable  angina/STEMI/ACS. With the history of both esophageal stricture and aortic insufficiency, these are both considerations. Absence of distress, and with the patient's capacity to follow up closely with an outpatient.        Gerhard Munch, MD 06/21/12 [phone]

## 2012-06-21 NOTE — ED Notes (Addendum)
Pt c/o generalized body aches, sweats, chills, last week.c/o pain has been increasingly worse since onset and felt tightness in chest and sob today. A&Ox4, resp e/u

## 2012-07-07 ENCOUNTER — Telehealth: Payer: Self-pay | Admitting: Internal Medicine

## 2012-07-07 NOTE — Telephone Encounter (Signed)
Omeprazole can be purchased without prescription

## 2012-07-07 NOTE — Telephone Encounter (Signed)
Pt can not afford omeprazole 20mg . Pt dropped off list of  4 dollar  Medications from [facility]. Pt would like to try another  PPI on [facility] 4 dollar list. Pt is aware MD out of office

## 2012-08-28 ENCOUNTER — Encounter: Payer: Self-pay | Admitting: Internal Medicine

## 2012-08-28 ENCOUNTER — Ambulatory Visit (INDEPENDENT_AMBULATORY_CARE_PROVIDER_SITE_OTHER): Payer: Medicare Other | Admitting: Internal Medicine

## 2012-08-28 VITALS — BP 140/80 | HR 61 | Temp 98.4°F | Resp 18 | Wt 162.0 lb

## 2012-08-28 DIAGNOSIS — I517 Cardiomegaly: Secondary | ICD-10-CM

## 2012-08-28 DIAGNOSIS — I10 Essential (primary) hypertension: Secondary | ICD-10-CM

## 2012-08-28 DIAGNOSIS — I359 Nonrheumatic aortic valve disorder, unspecified: Secondary | ICD-10-CM

## 2012-08-28 DIAGNOSIS — K279 Peptic ulcer, site unspecified, unspecified as acute or chronic, without hemorrhage or perforation: Secondary | ICD-10-CM

## 2012-08-28 DIAGNOSIS — E785 Hyperlipidemia, unspecified: Secondary | ICD-10-CM

## 2012-08-28 MED ORDER — TRAMADOL HCL 50 MG PO TABS: 50.0000 mg | ORAL_TABLET | Freq: Four times a day (QID) | ORAL | Status: DC | PRN

## 2012-08-28 MED ORDER — BENAZEPRIL HCL 40 MG PO TABS: 40.0000 mg | ORAL_TABLET | Freq: Every morning | ORAL | Status: DC

## 2012-08-28 MED ORDER — PRAVASTATIN SODIUM 40 MG PO TABS: 40.0000 mg | ORAL_TABLET | Freq: Every day | ORAL | Status: DC

## 2012-08-28 MED ORDER — METOPROLOL TARTRATE 25 MG PO TABS: 25.0000 mg | ORAL_TABLET | Freq: Every day | ORAL | Status: DC

## 2012-08-28 MED ORDER — OMEPRAZOLE 20 MG PO CPDR: 20.0000 mg | DELAYED_RELEASE_CAPSULE | Freq: Every morning | ORAL | Status: DC

## 2012-08-28 MED ORDER — VERAPAMIL HCL 120 MG PO TABS: 120.0000 mg | ORAL_TABLET | Freq: Two times a day (BID) | ORAL | Status: DC

## 2012-08-28 MED ORDER — HYDROCHLOROTHIAZIDE 25 MG PO TABS: 25.0000 mg | ORAL_TABLET | Freq: Every morning | ORAL | Status: DC

## 2012-08-28 NOTE — Patient Instructions (Signed)
Limit your sodium (Salt) intake  Please check your blood pressure on a regular basis.  If it is consistently greater than 150/90, please make an office appointment.    It is important that you exercise regularly, at least 20 minutes 3 to 4 times per week.  If you develop chest pain or shortness of breath seek  medical attention.  Return in 6 months for follow-up  

## 2012-08-28 NOTE — Progress Notes (Signed)
Subjective:    Patient ID: Tanya Gray, female    DOB: 10/03/1945, 67 y.o.   MRN: [id number]  HPI  67 year old patient who is seen today for followup. She has a history of treated hypertension. Hydrochlorothiazide was added to her regimen 3 months ago with improved blood pressure control. Today she feels quite well. She has dyslipidemia cardiomegaly and a history of aortic insufficiency. She has osteoarthritis.. She has a remote history of peptic ulcer disease which has been stable. She remains on omeprazole.  Past Medical History  Diagnosis Date  . AORTIC INSUFFICIENCY 08/03/2009  . Cardiomegaly 01/29/2007  . Diverticulosis of colon (without mention of hemorrhage) 08/08/2006  . ESOPHAGEAL STRICTURE 02/19/2008  . Heartburn 02/19/2008  . HYPERLIPIDEMIA 02/18/2008  . HYPERTENSION, BENIGN SYSTEMIC 08/08/2006  . MYALGIA 01/29/2007  . OSTEOARTHRITIS 12/21/2008  . PEPTIC ULCER DISEASE 12/21/2008  . RHINITIS, ALLERGIC 08/08/2006  . Unspecified asthma 08/08/2006    History   Social History  . Marital Status: Single    Spouse Name: N/A    Number of Children: N/A  . Years of Education: N/A   Occupational History  . Not on file.   Social History Main Topics  . Smoking status: Former Smoker    Quit date: 06/12/2003  . Smokeless tobacco: Never Used  . Alcohol Use: 0.0 oz/week    1-2 Cans of beer per week  . Drug Use: Not on file  . Sexually Active: Not on file   Other Topics Concern  . Not on file   Social History Narrative  . No narrative on file    Past Surgical History  Procedure Laterality Date  . Abdominal hysterectomy    . Esophagogastroduodenoscopy    . Endotracheal intubation emergent      Family History  Problem Relation Age of Onset  . Diabetes Mother   . Hypertension Mother   . Lung disease Mother   . Aneurysm Sister     cerebral  . Colon cancer Neg Hx     No Known Allergies  Current Outpatient Prescriptions on File Prior to Visit  Medication Sig Dispense  Refill  . aspirin 81 MG tablet Take 81 mg by mouth daily.        . benazepril (LOTENSIN) 40 MG tablet Take 40 mg by mouth every morning.      . calcium-vitamin D (OSCAL) 250-125 MG-UNIT per tablet Take 1 tablet by mouth daily.        . hydrochlorothiazide (HYDRODIURIL) 25 MG tablet Take 25 mg by mouth every morning.      . metoprolol tartrate (LOPRESSOR) 25 MG tablet Take 25 mg by mouth daily. 1 tablet twice daily      . omeprazole (PRILOSEC) 20 MG capsule Take 20 mg by mouth every morning.      . pravastatin (PRAVACHOL) 40 MG tablet Take 1 tablet (40 mg total) by mouth daily.  90 tablet  3  . sucralfate (CARAFATE) 1 GM/10ML suspension Take 10 mLs (1 g total) by mouth every 6 (six) hours as needed (chest pain).  420 mL  0  . traMADol (ULTRAM) 50 MG tablet Take 50 mg by mouth every 6 (six) hours as needed. As needed for pain.      . verapamil (CALAN) 120 MG tablet Take 1 tablet (120 mg total) by mouth 2 (two) times daily.  180 tablet  3   No current facility-administered medications on file prior to visit.    BP 140/80  Pulse 61  Temp(Src) 98.4 F (  36.9 C) (Oral)  Resp 18  Wt 162 lb (73.483 kg)  BMI 28.24 kg/m2  SpO2 97%      Review of Systems  Constitutional: Negative.   HENT: Negative for hearing loss, congestion, sore throat, rhinorrhea, dental problem, sinus pressure and tinnitus.   Eyes: Negative for pain, discharge and visual disturbance.  Respiratory: Negative for cough and shortness of breath.   Cardiovascular: Negative for chest pain, palpitations and leg swelling.  Gastrointestinal: Negative for nausea, vomiting, abdominal pain, diarrhea, constipation, blood in stool and abdominal distention.  Genitourinary: Negative for dysuria, urgency, frequency, hematuria, flank pain, vaginal bleeding, vaginal discharge, difficulty urinating, vaginal pain and pelvic pain.  Musculoskeletal: Negative for joint swelling, arthralgias and gait problem.  Skin: Negative for rash.   Neurological: Negative for dizziness, syncope, speech difficulty, weakness, numbness and headaches.  Hematological: Negative for adenopathy.  Psychiatric/Behavioral: Negative for behavioral problems, dysphoric mood and agitation. The patient is not nervous/anxious.        Objective:   Physical Exam  Constitutional: She is oriented to person, place, and time. She appears well-developed and well-nourished.  HENT:  Head: Normocephalic.  Right Ear: External ear normal.  Left Ear: External ear normal.  Mouth/Throat: Oropharynx is clear and moist.  Eyes: Conjunctivae and EOM are normal. Pupils are equal, round, and reactive to light.  Neck: Normal range of motion. Neck supple. No thyromegaly present.  Cardiovascular: Normal rate, regular rhythm and intact distal pulses.   Murmur heard. Grade 2/6 systolic murmur Murmur of AI not appreciated today  Pulmonary/Chest: Effort normal and breath sounds normal.  Abdominal: Soft. Bowel sounds are normal. She exhibits no mass. There is no tenderness.  Musculoskeletal: Normal range of motion.  Lymphadenopathy:    She has no cervical adenopathy.  Neurological: She is alert and oriented to person, place, and time.  Skin: Skin is warm and dry. No rash noted.  Psychiatric: She has a normal mood and affect. Her behavior is normal.          Assessment & Plan:    HTN-well controlled AI HLD PUD  ROV 6 mon

## 2012-09-01 ENCOUNTER — Other Ambulatory Visit: Payer: Self-pay | Admitting: *Deleted

## 2012-09-01 MED ORDER — METOPROLOL TARTRATE 25 MG PO TABS: 25.0000 mg | ORAL_TABLET | Freq: Two times a day (BID) | ORAL | Status: DC

## 2012-12-11 ENCOUNTER — Other Ambulatory Visit: Payer: Self-pay

## 2012-12-11 MED ORDER — TRAMADOL HCL 50 MG PO TABS: 50.0000 mg | ORAL_TABLET | Freq: Four times a day (QID) | ORAL | Status: DC | PRN

## 2012-12-11 NOTE — Telephone Encounter (Signed)
Rx request for tramadol 50 mg 360; rx sent to pharmacy.

## 2013-01-09 ENCOUNTER — Encounter: Payer: Self-pay | Admitting: Gastroenterology

## 2013-03-03 ENCOUNTER — Encounter: Payer: Self-pay | Admitting: Internal Medicine

## 2013-03-03 ENCOUNTER — Ambulatory Visit (INDEPENDENT_AMBULATORY_CARE_PROVIDER_SITE_OTHER): Payer: Medicare Other | Admitting: Internal Medicine

## 2013-03-03 VITALS — BP 142/90 | HR 57 | Temp 98.2°F | Resp 20 | Wt 164.0 lb

## 2013-03-03 DIAGNOSIS — K222 Esophageal obstruction: Secondary | ICD-10-CM

## 2013-03-03 DIAGNOSIS — Z23 Encounter for immunization: Secondary | ICD-10-CM

## 2013-03-03 DIAGNOSIS — Z860100 Personal history of colon polyps, unspecified: Secondary | ICD-10-CM | POA: Insufficient documentation

## 2013-03-03 DIAGNOSIS — M1990 Unspecified osteoarthritis, unspecified site: Secondary | ICD-10-CM

## 2013-03-03 DIAGNOSIS — I10 Essential (primary) hypertension: Secondary | ICD-10-CM

## 2013-03-03 DIAGNOSIS — J45909 Unspecified asthma, uncomplicated: Secondary | ICD-10-CM

## 2013-03-03 NOTE — Patient Instructions (Signed)
Limit your sodium (Salt) intake  Please check your blood pressure on a regular basis.  If it is consistently greater than 150/90, please make an office appointment.    It is important that you exercise regularly, at least 20 minutes 3 to 4 times per week.  If you develop chest pain or shortness of breath seek  medical attention.  You need to lose weight.  Consider a lower calorie diet and regular exercise.  Schedule your colonoscopy to help detect colon cancer.  Return in 6 months for follow-up

## 2013-03-03 NOTE — Progress Notes (Signed)
Subjective:    Patient ID: Tanya Gray, female    DOB: 10/08/1945, 67 y.o.   MRN: [id number]  HPI  67 year old patient who is seen today for followup of hypertension. She has received a recall letter from GI for followup colonoscopy. She is doing quite well today without major concerns or complaints. Denies any cardiopulmonary complaints. She has a remote history of asthma which has been stable. She has a history of esophageal stricture and remains on chronic omeprazole.  Wt Readings from Last 3 Encounters:  03/03/13 164 lb (74.39 kg)  08/28/12 162 lb (73.483 kg)  05/30/12 166 lb (75.297 kg)   Past Medical History  Diagnosis Date  . AORTIC INSUFFICIENCY 08/03/2009  . Cardiomegaly 01/29/2007  . Diverticulosis of colon (without mention of hemorrhage) 08/08/2006  . ESOPHAGEAL STRICTURE 02/19/2008  . Heartburn 02/19/2008  . HYPERLIPIDEMIA 02/18/2008  . HYPERTENSION, BENIGN SYSTEMIC 08/08/2006  . MYALGIA 01/29/2007  . OSTEOARTHRITIS 12/21/2008  . PEPTIC ULCER DISEASE 12/21/2008  . RHINITIS, ALLERGIC 08/08/2006  . Unspecified asthma(493.90) 08/08/2006    History   Social History  . Marital Status: Single    Spouse Name: N/A    Number of Children: N/A  . Years of Education: N/A   Occupational History  . Not on file.   Social History Main Topics  . Smoking status: Former Smoker    Quit date: 06/12/2003  . Smokeless tobacco: Never Used  . Alcohol Use: 0.0 oz/week    1-2 Cans of beer per week  . Drug Use: Not on file  . Sexual Activity: Not on file   Other Topics Concern  . Not on file   Social History Narrative  . No narrative on file    Past Surgical History  Procedure Laterality Date  . Abdominal hysterectomy    . Esophagogastroduodenoscopy    . Endotracheal intubation emergent      Family History  Problem Relation Age of Onset  . Diabetes Mother   . Hypertension Mother   . Lung disease Mother   . Aneurysm Sister     cerebral  . Colon cancer Neg Hx     No  Known Allergies  Current Outpatient Prescriptions on File Prior to Visit  Medication Sig Dispense Refill  . aspirin 81 MG tablet Take 81 mg by mouth daily.        . benazepril (LOTENSIN) 40 MG tablet Take 1 tablet (40 mg total) by mouth every morning.  90 tablet  6  . calcium-vitamin D (OSCAL) 250-125 MG-UNIT per tablet Take 1 tablet by mouth daily.        . hydrochlorothiazide (HYDRODIURIL) 25 MG tablet Take 1 tablet (25 mg total) by mouth every morning.  90 tablet  6  . metoprolol tartrate (LOPRESSOR) 25 MG tablet Take 1 tablet (25 mg total) by mouth 2 (two) times daily. 1 tablet twice daily  180 tablet  3  . omeprazole (PRILOSEC) 20 MG capsule Take 1 capsule (20 mg total) by mouth every morning.  90 capsule  6  . pravastatin (PRAVACHOL) 40 MG tablet Take 1 tablet (40 mg total) by mouth daily.  90 tablet  3  . traMADol (ULTRAM) 50 MG tablet Take 1 tablet (50 mg total) by mouth every 6 (six) hours as needed. As needed for pain.  90 tablet  0  . verapamil (CALAN) 120 MG tablet Take 1 tablet (120 mg total) by mouth 2 (two) times daily.  180 tablet  3   No current facility-administered medications on  file prior to visit.    BP 142/90  Pulse 57  Temp(Src) 98.2 F (36.8 C) (Oral)  Resp 20  Wt 164 lb (74.39 kg)  BMI 28.59 kg/m2  SpO2 98%    Review of Systems  Constitutional: Negative.   HENT: Negative for hearing loss, congestion, sore throat, rhinorrhea, dental problem, sinus pressure and tinnitus.   Eyes: Negative for pain, discharge and visual disturbance.  Respiratory: Negative for cough and shortness of breath.   Cardiovascular: Negative for chest pain, palpitations and leg swelling.  Gastrointestinal: Negative for nausea, vomiting, abdominal pain, diarrhea, constipation, blood in stool and abdominal distention.  Genitourinary: Negative for dysuria, urgency, frequency, hematuria, flank pain, vaginal bleeding, vaginal discharge, difficulty urinating, vaginal pain and pelvic pain.   Musculoskeletal: Negative for joint swelling, arthralgias and gait problem.  Skin: Negative for rash.  Neurological: Negative for dizziness, syncope, speech difficulty, weakness, numbness and headaches.  Hematological: Negative for adenopathy.  Psychiatric/Behavioral: Negative for behavioral problems, dysphoric mood and agitation. The patient is not nervous/anxious.        Objective:   Physical Exam  Constitutional: She is oriented to person, place, and time. She appears well-developed and well-nourished.  Repeat blood pressure 120/64  HENT:  Head: Normocephalic.  Right Ear: External ear normal.  Left Ear: External ear normal.  Mouth/Throat: Oropharynx is clear and moist.  Eyes: Conjunctivae and EOM are normal. Pupils are equal, round, and reactive to light.  Neck: Normal range of motion. Neck supple. No thyromegaly present.  Cardiovascular: Normal rate, regular rhythm, normal heart sounds and intact distal pulses.   Pulse slow and regular  Pulmonary/Chest: Effort normal and breath sounds normal.  Abdominal: Soft. Bowel sounds are normal. She exhibits no mass. There is no tenderness.  Musculoskeletal: Normal range of motion.  Lymphadenopathy:    She has no cervical adenopathy.  Neurological: She is alert and oriented to person, place, and time.  Skin: Skin is warm and dry. No rash noted.  Psychiatric: She has a normal mood and affect. Her behavior is normal.          Assessment & Plan:   Hypertension well controlled History of aortic insufficiency. No audible murmur today History colonic polyps. Patient will schedule followup colonoscopy  CPX 6 months All meds renewed

## 2013-04-30 ENCOUNTER — Encounter: Payer: Self-pay | Admitting: Gastroenterology

## 2013-06-09 LAB — HM MAMMOGRAPHY

## 2013-06-15 ENCOUNTER — Ambulatory Visit (AMBULATORY_SURGERY_CENTER): Payer: Self-pay | Admitting: *Deleted

## 2013-06-15 VITALS — Ht 65.5 in | Wt 159.6 lb

## 2013-06-15 DIAGNOSIS — Z86010 Personal history of colonic polyps: Secondary | ICD-10-CM

## 2013-06-15 MED ORDER — NA SULFATE-K SULFATE-MG SULF 17.5-3.13-1.6 GM/177ML PO SOLN: 1.0000 | Freq: Once | ORAL | Status: DC

## 2013-06-15 NOTE — Progress Notes (Signed)
Denies allergies to eggs or soy products. Denies complications with sedation or anesthesia. 

## 2013-06-16 ENCOUNTER — Encounter: Payer: Self-pay | Admitting: Internal Medicine

## 2013-06-17 ENCOUNTER — Encounter: Payer: Self-pay | Admitting: Gastroenterology

## 2013-06-26 ENCOUNTER — Encounter: Payer: Self-pay | Admitting: Gastroenterology

## 2013-06-26 ENCOUNTER — Ambulatory Visit (AMBULATORY_SURGERY_CENTER): Payer: Medicare Other | Admitting: Gastroenterology

## 2013-06-26 VITALS — BP 125/70 | HR 46 | Temp 96.0°F | Resp 19 | Ht 65.5 in | Wt 159.0 lb

## 2013-06-26 DIAGNOSIS — K5730 Diverticulosis of large intestine without perforation or abscess without bleeding: Secondary | ICD-10-CM

## 2013-06-26 DIAGNOSIS — Z86010 Personal history of colonic polyps: Secondary | ICD-10-CM

## 2013-06-26 MED ORDER — SODIUM CHLORIDE 0.9 % IV SOLN: 500.0000 mL | INTRAVENOUS | Status: DC

## 2013-06-26 NOTE — Op Note (Signed)
[facility] [address] [place], [id number]   COLONOSCOPY PROCEDURE REPORT  PATIENT: Tanya Gray, Tanya Gray  MR#: #[id number] BIRTHDATE: 01-Apr-1946 , 67  yrs. old GENDER: Female ENDOSCOPIST: Louis Meckelobert D Keerthana Vanrossum, MD REFERRED BY: PROCEDURE DATE:  06/26/2013 PROCEDURE:   Colonoscopy, diagnostic First Screening Colonoscopy - Avg.  risk and is 50 yrs.  old or older - No.  Prior Negative Screening - Now for repeat screening. N/A  History of Adenoma - Now for follow-up colonoscopy & has been > or = to 3 yrs.  Yes hx of adenoma.  Has been 3 or more years since last colonoscopy.  Polyps Removed Today? No.  Recommend repeat exam, <10 yrs? Yes.  High risk (family or personal hx). ASA CLASS:   Class II INDICATIONS:Patient's personal history of colon polyps. 2009-4 adenomatous polyps removed MEDICATIONS: MAC sedation, administered by CRNA and propofol (Diprivan) 200mg  IV  DESCRIPTION OF PROCEDURE:   After the risks benefits and alternatives of the procedure were thoroughly explained, informed consent was obtained.  A digital rectal exam revealed no abnormalities of the rectum.   The LB [id number]  endoscope was introduced through the anus and advanced to the cecum, which was identified by both the appendix and ileocecal valve. No adverse events experienced.   The quality of the prep was Suprep good  The instrument was then slowly withdrawn as the colon was fully examined.      COLON FINDINGS: Mild diverticulosis was noted in the sigmoid colon. The colon was otherwise normal.  There was no diverticulosis, inflammation, polyps or cancers unless previously stated. Retroflexed views revealed no abnormalities. The time to cecum=2 minutes 35 seconds.  Withdrawal time=9 minutes 12 seconds.  The scope was withdrawn and the procedure completed. COMPLICATIONS: There were no complications.  ENDOSCOPIC IMPRESSION: 1.   Mild diverticulosis was noted in the sigmoid colon 2.    The colon was otherwise normal  RECOMMENDATIONS: colonoscopy 7 years  eSigned:  Louis Meckelobert D Curry Dulski, MD 06/26/2013 2:54 PM   cc: Gordy SaversPeter F Kwiatkowski, MD   PATIENT NAME:  Tanya Gray, Tanya Gray MR#: #[id number]

## 2013-06-26 NOTE — Patient Instructions (Signed)
YOU HAD AN ENDOSCOPIC PROCEDURE TODAY AT THE [facility]: Refer to the procedure report that was given to you for any specific questions about what was found during the examination.  If the procedure report does not answer your questions, please call your gastroenterologist to clarify.  If you requested that your care partner not be given the details of your procedure findings, then the procedure report has been included in a sealed envelope for you to review at your convenience later.  YOU SHOULD EXPECT: Some feelings of bloating in the abdomen. Passage of more gas than usual.  Walking can help get rid of the air that was put into your GI tract during the procedure and reduce the bloating. If you had a lower endoscopy (such as a colonoscopy or flexible sigmoidoscopy) you may notice spotting of blood in your stool or on the toilet paper. If you underwent a bowel prep for your procedure, then you may not have a normal bowel movement for a few days.  DIET: Your first meal following the procedure should be a light meal and then it is ok to progress to your normal diet.  A half-sandwich or bowl of soup is an example of a good first meal.  Heavy or fried foods are harder to digest and may make you feel nauseous or bloated.  Likewise meals heavy in dairy and vegetables can cause extra gas to form and this can also increase the bloating.  Drink plenty of fluids but you should avoid alcoholic beverages for 24 hours.  ACTIVITY: Your care partner should take you home directly after the procedure.  You should plan to take it easy, moving slowly for the rest of the day.  You can resume normal activity the day after the procedure however you should NOT DRIVE or use heavy machinery for 24 hours (because of the sedation medicines used during the test).    SYMPTOMS TO REPORT IMMEDIATELY: A gastroenterologist can be reached at any hour.  During normal business hours, 8:30 AM to 5:00 PM Monday through Friday,  call [phone].  After hours and on weekends, please call the GI answering service at [phone] who will take a message and have the physician on call contact you.   Following lower endoscopy (colonoscopy or flexible sigmoidoscopy):  Excessive amounts of blood in the stool  Significant tenderness or worsening of abdominal pains  Swelling of the abdomen that is new, acute  Fever of 100F or higher    FOLLOW UP: If any biopsies were taken you will be contacted by phone or by letter within the next 1-3 weeks.  Call your gastroenterologist if you have not heard about the biopsies in 3 weeks.  Our staff will call the home number listed on your records the next business day following your procedure to check on you and address any questions or concerns that you may have at that time regarding the information given to you following your procedure. This is a courtesy call and so if there is no answer at the home number and we have not heard from you through the emergency physician on call, we will assume that you have returned to your regular daily activities without incident.  SIGNATURES/CONFIDENTIALITY: You and/or your care partner have signed paperwork which will be entered into your electronic medical record.  These signatures attest to the fact that that the information above on your After Visit Summary has been reviewed and is understood.  Full responsibility of the confidentiality   of this discharge information lies with you and/or your care-partner.  Diverticulosis, high fiber diet information given.  Repeat colonoscopy in 7 years-2022

## 2013-06-26 NOTE — Progress Notes (Signed)
Procedure ends, to recovery, report given and VSS. 

## 2013-06-29 ENCOUNTER — Telehealth: Payer: Self-pay | Admitting: *Deleted

## 2013-06-29 NOTE — Telephone Encounter (Signed)
Left message that we called for f/u 

## 2013-08-24 ENCOUNTER — Telehealth: Payer: Self-pay | Admitting: Internal Medicine

## 2013-08-24 NOTE — Telephone Encounter (Signed)
Pt is needing new rx for tramadol (ultram) 50 mg and metroprotlol tartrate (lopressor) 25 mg sent to [facility] on [address]. Pt states she has appointment on 3/24 however she is completely out of these two meds.

## 2013-08-25 MED ORDER — TRAMADOL HCL 50 MG PO TABS: 50.0000 mg | ORAL_TABLET | Freq: Four times a day (QID) | ORAL | Status: DC | PRN

## 2013-08-25 MED ORDER — METOPROLOL TARTRATE 25 MG PO TABS: 25.0000 mg | ORAL_TABLET | Freq: Two times a day (BID) | ORAL | Status: DC

## 2013-08-25 NOTE — Telephone Encounter (Signed)
Pt notified Rx refills done as requested.

## 2013-09-01 ENCOUNTER — Encounter: Payer: Self-pay | Admitting: Internal Medicine

## 2013-09-01 ENCOUNTER — Ambulatory Visit (INDEPENDENT_AMBULATORY_CARE_PROVIDER_SITE_OTHER): Payer: Medicare Other | Admitting: Internal Medicine

## 2013-09-01 VITALS — BP 130/80 | HR 52 | Temp 98.7°F | Resp 20 | Ht 63.5 in | Wt 159.0 lb

## 2013-09-01 DIAGNOSIS — K222 Esophageal obstruction: Secondary | ICD-10-CM

## 2013-09-01 DIAGNOSIS — I517 Cardiomegaly: Secondary | ICD-10-CM

## 2013-09-01 DIAGNOSIS — M1990 Unspecified osteoarthritis, unspecified site: Secondary | ICD-10-CM

## 2013-09-01 DIAGNOSIS — E785 Hyperlipidemia, unspecified: Secondary | ICD-10-CM

## 2013-09-01 DIAGNOSIS — I10 Essential (primary) hypertension: Secondary | ICD-10-CM

## 2013-09-01 DIAGNOSIS — I359 Nonrheumatic aortic valve disorder, unspecified: Secondary | ICD-10-CM

## 2013-09-01 DIAGNOSIS — Z23 Encounter for immunization: Secondary | ICD-10-CM

## 2013-09-01 DIAGNOSIS — Z0000 Encounter for general adult medical examination without abnormal findings: Secondary | ICD-10-CM

## 2013-09-01 LAB — CBC WITH DIFFERENTIAL/PLATELET
Basophils Absolute: 0 10*3/uL (ref 0.0–0.1)
Basophils Relative: 0.4 % (ref 0.0–3.0)
Eosinophils Absolute: 0 10*3/uL (ref 0.0–0.7)
Eosinophils Relative: 0.8 % (ref 0.0–5.0)
HCT: 39.4 % (ref 36.0–46.0)
Hemoglobin: 12.7 g/dL (ref 12.0–15.0)
Lymphocytes Relative: 35.3 % (ref 12.0–46.0)
Lymphs Abs: 1.4 10*3/uL (ref 0.7–4.0)
MCHC: 32.1 g/dL (ref 30.0–36.0)
MCV: 101.5 fl — ABNORMAL HIGH (ref 78.0–100.0)
Monocytes Absolute: 0.4 10*3/uL (ref 0.1–1.0)
Monocytes Relative: 11 % (ref 3.0–12.0)
Neutro Abs: 2.1 10*3/uL (ref 1.4–7.7)
Neutrophils Relative %: 52.5 % (ref 43.0–77.0)
Platelets: 213 10*3/uL (ref 150.0–400.0)
RBC: 3.89 Mil/uL (ref 3.87–5.11)
RDW: 15 % — ABNORMAL HIGH (ref 11.5–14.6)
WBC: 3.9 10*3/uL — ABNORMAL LOW (ref 4.5–10.5)

## 2013-09-01 LAB — COMPREHENSIVE METABOLIC PANEL
ALT: 18 U/L (ref 0–35)
AST: 28 U/L (ref 0–37)
Albumin: 4.3 g/dL (ref 3.5–5.2)
Alkaline Phosphatase: 39 U/L (ref 39–117)
BUN: 10 mg/dL (ref 6–23)
CO2: 34 mEq/L — ABNORMAL HIGH (ref 19–32)
Calcium: 9.7 mg/dL (ref 8.4–10.5)
Chloride: 97 mEq/L (ref 96–112)
Creatinine, Ser: 0.8 mg/dL (ref 0.4–1.2)
GFR: 99.42 mL/min (ref >60.00)
Glucose, Bld: 88 mg/dL (ref 70–99)
Potassium: 3.4 mEq/L — ABNORMAL LOW (ref 3.5–5.1)
Sodium: 140 mEq/L (ref 135–145)
Total Bilirubin: 0.7 mg/dL (ref 0.3–1.2)
Total Protein: 6.8 g/dL (ref 6.0–8.3)

## 2013-09-01 LAB — LIPID PANEL
Cholesterol: 176 mg/dL (ref 0–200)
HDL: 74.1 mg/dL (ref >39.00)
LDL Cholesterol: 80 mg/dL (ref 0–99)
Total CHOL/HDL Ratio: 2
Triglycerides: 108 mg/dL (ref 0.0–149.0)
VLDL: 21.6 mg/dL (ref 0.0–40.0)

## 2013-09-01 LAB — TSH: TSH: 0.86 u[IU]/mL (ref 0.35–5.50)

## 2013-09-01 MED ORDER — HYDROCHLOROTHIAZIDE 25 MG PO TABS: 25.0000 mg | ORAL_TABLET | Freq: Every morning | ORAL | Status: DC

## 2013-09-01 MED ORDER — METOPROLOL TARTRATE 25 MG PO TABS: 25.0000 mg | ORAL_TABLET | Freq: Two times a day (BID) | ORAL | Status: DC

## 2013-09-01 MED ORDER — OMEPRAZOLE 20 MG PO CPDR: 20.0000 mg | DELAYED_RELEASE_CAPSULE | Freq: Every morning | ORAL | Status: DC

## 2013-09-01 MED ORDER — BENAZEPRIL HCL 40 MG PO TABS: 40.0000 mg | ORAL_TABLET | Freq: Every morning | ORAL | Status: DC

## 2013-09-01 MED ORDER — VERAPAMIL HCL 120 MG PO TABS: 120.0000 mg | ORAL_TABLET | Freq: Two times a day (BID) | ORAL | Status: DC

## 2013-09-01 MED ORDER — PRAVASTATIN SODIUM 40 MG PO TABS: 40.0000 mg | ORAL_TABLET | Freq: Every day | ORAL | Status: DC

## 2013-09-01 NOTE — Progress Notes (Signed)
Subjective:    Patient ID: Tanya Gray, female    DOB: 1946/06/10, 68 y.o.   MRN: [id number]  HPI 7 -year-old patient who is seen today for a preventive health examination. Medical problems include treated hypertension. She has a history of cardiomegaly and mild aortic insufficiency and is followed by cardiology;  EKG last year was normal. She denies any cardiopulmonary complaints. She does have some osteoarthritis. She has a history of esophageal stricture and remains on chronic PPI therapy. No real concerns or complaints today she remains on pravastatin  for dyslipidemia which she continues to tolerate well.  Allergies (verified):  No Known Drug Allergies   Past History:  Past Medical History:   1. Hypertension.  2. Degenerative joint disease.  3. Echocardiogram, August 2008 which showed mild LVH, EF 65%, mild aortic insufficiency.  4. History of esophageal stricture status post dilatation, August 2009.  5. Peptic ulcer disease.  6. Hypercholesteremia.  7. Atypical chest pain. Exercise treadmill test in September 2009. The patient exercised for 7 minutes 30 seconds. There was no evidence for ischemia by EKG.  8. Asthma  9. Diverticulosis, colon   Family History:   Mother (78)with DM II. HTN. No MI, cancer, lung/GI disease.  No FH of Colon Cancer:  Father- unknown status  One sister: died cerebral aneurysm    Past Medical History  Diagnosis Date  . AORTIC INSUFFICIENCY 08/03/2009  . Cardiomegaly 01/29/2007  . Diverticulosis of colon (without mention of hemorrhage) 08/08/2006  . ESOPHAGEAL STRICTURE 02/19/2008  . Heartburn 02/19/2008  . HYPERLIPIDEMIA 02/18/2008  . HYPERTENSION, BENIGN SYSTEMIC 08/08/2006  . MYALGIA 01/29/2007  . OSTEOARTHRITIS 12/21/2008  . PEPTIC ULCER DISEASE 12/21/2008  . RHINITIS, ALLERGIC 08/08/2006  . Unspecified asthma(493.90) 08/08/2006    History   Social History  . Marital Status: Single    Spouse Name: N/A    Number of Children: N/A  .  Years of Education: N/A   Occupational History  . Not on file.   Social History Main Topics  . Smoking status: Former Smoker    Quit date: 06/12/2003  . Smokeless tobacco: Never Used  . Alcohol Use: 0.0 oz/week    1-2 Cans of beer per week  . Drug Use: No  . Sexual Activity: Not on file   Other Topics Concern  . Not on file   Social History Narrative  . No narrative on file    Past Surgical History  Procedure Laterality Date  . Abdominal hysterectomy    . Esophagogastroduodenoscopy    . Endotracheal intubation emergent      Family History  Problem Relation Age of Onset  . Diabetes Mother   . Hypertension Mother   . Lung disease Mother   . Aneurysm Sister     cerebral  . Colon cancer Neg Hx   . Esophageal cancer Neg Hx   . Rectal cancer Neg Hx   . Stomach cancer Neg Hx     No Known Allergies  Current Outpatient Prescriptions on File Prior to Visit  Medication Sig Dispense Refill  . aspirin 81 MG tablet Take 81 mg by mouth daily.        . calcium-vitamin D (OSCAL) 250-125 MG-UNIT per tablet Take 1 tablet by mouth daily.        . traMADol (ULTRAM) 50 MG tablet Take 1 tablet (50 mg total) by mouth every 6 (six) hours as needed.  90 tablet  2   No current facility-administered medications on file prior to visit.  BP 130/80  Pulse 52  Temp(Src) 98.7 F (37.1 C) (Oral)  Resp 20  Ht 5' 3.5" (1.613 m)  Wt 159 lb (72.122 kg)  BMI 27.72 kg/m2  SpO2 95%  1. Risk factors, based on past  M,S,F history- cardiovascular risk factors include hypertension, and dyslipidemia.  She has a history of trivial aortic stenosis and mild cardiomegaly  2.  Physical activities: No limitations  3.  Depression/mood: No history of depression or mood disorder  4.  Hearing: No deficits  5.  ADL's: Completely independent  6.  Fall risk: Low  7.  Home safety: No problems identified  8.  Height weight, and visual acuity; hard weight stable.  No change in visual acuity  9.   Counseling: Heart healthy diet.  Encouraged regular exercise and modest weight loss.  All discussed  10. Lab orders based on risk factors: Laboratory update will be reviewed  11. Referral : Ophthalmology  12. Care plan: Refer to ophthalmology  13. Cognitive assessment: Alert and oriented.  Normal affect.  No cognitive dysfunction       Review of Systems  Constitutional: Negative for fever, appetite change, fatigue and unexpected weight change.  HENT: Negative for congestion, dental problem, ear pain, hearing loss, mouth sores, nosebleeds, sinus pressure, sore throat, tinnitus, trouble swallowing and voice change.   Eyes: Negative for photophobia, pain, redness and visual disturbance.  Respiratory: Negative for cough, chest tightness and shortness of breath.   Cardiovascular: Negative for chest pain, palpitations and leg swelling.  Gastrointestinal: Negative for nausea, vomiting, abdominal pain, diarrhea, constipation, blood in stool, abdominal distention and rectal pain.  Genitourinary: Negative for dysuria, urgency, frequency, hematuria, flank pain, vaginal bleeding, vaginal discharge, difficulty urinating, genital sores, vaginal pain, menstrual problem and pelvic pain.  Musculoskeletal: Positive for arthralgias and back pain. Negative for neck stiffness.  Skin: Negative for rash.  Neurological: Negative for dizziness, syncope, speech difficulty, weakness, light-headedness, numbness and headaches.  Hematological: Negative for adenopathy. Does not bruise/bleed easily.  Psychiatric/Behavioral: Negative for suicidal ideas, behavioral problems, self-injury, dysphoric mood and agitation. The patient is not nervous/anxious.        Objective:   Physical Exam  Constitutional: She is oriented to person, place, and time. She appears well-developed and well-nourished.  Blood pressure 160/100 both arms  HENT:  Head: Normocephalic and atraumatic.  Right Ear: External ear normal.  Left Ear:  External ear normal.  Mouth/Throat: Oropharynx is clear and moist.  Eyes: Conjunctivae and EOM are normal.  Neck: Normal range of motion. Neck supple. No JVD present. No thyromegaly present.  Cardiovascular: Normal rate, regular rhythm, normal heart sounds and intact distal pulses.   No murmur heard. Grade 1/6 systolic murmur at the primary aortic area. No murmur of AI appreciated  Pulmonary/Chest: Effort normal and breath sounds normal. She has no wheezes. She has no rales.  Abdominal: Soft. Bowel sounds are normal. She exhibits no distension and no mass. There is no tenderness. There is no rebound and no guarding.  Musculoskeletal: Normal range of motion. She exhibits no edema and no tenderness.  Neurological: She is alert and oriented to person, place, and time. She has normal reflexes. No cranial nerve deficit. She exhibits normal muscle tone. Coordination normal.  Skin: Skin is warm and dry. No rash noted.  Psychiatric: She has a normal mood and affect. Her behavior is normal.          Assessment & Plan:   Preventive health exam Hypertension suboptimal control. We'll resume amlodipine  5 mg daily. Has been on as high a dose as 10 mg in the past. Home blood pressure monitor and encouraged we'll recheck in 4 weeks Aortic insufficiency. This appears to be trivial no murmur of AI noted today History of esophageal stricture. We'll continue PPI therapy Dyslipidemia stable

## 2013-09-01 NOTE — Patient Instructions (Signed)
Limit your sodium (Salt) intake    It is important that you exercise regularly, at least 20 minutes 3 to 4 times per week.  If you develop chest pain or shortness of breath seek  medical attention.  Return in one year for follow-up   

## 2013-09-01 NOTE — Progress Notes (Signed)
Pre-visit discussion using our clinic review tool. No additional management support is needed unless otherwise documented below in the visit note.  

## 2013-09-02 ENCOUNTER — Telehealth: Payer: Self-pay | Admitting: Internal Medicine

## 2013-09-02 NOTE — Telephone Encounter (Signed)
Relevant patient education mailed to patient.  

## 2013-11-19 ENCOUNTER — Telehealth: Payer: Self-pay | Admitting: Internal Medicine

## 2013-11-19 DIAGNOSIS — Z1231 Encounter for screening mammogram for malignant neoplasm of breast: Secondary | ICD-10-CM

## 2013-11-19 NOTE — Telephone Encounter (Signed)
Pt is needing a referral to [facility], pt has appt on 12/09/13 at 9:45am.

## 2014-01-06 ENCOUNTER — Encounter: Payer: Self-pay | Admitting: Internal Medicine

## 2014-01-18 ENCOUNTER — Encounter: Payer: Self-pay | Admitting: Internal Medicine

## 2014-03-12 ENCOUNTER — Ambulatory Visit (INDEPENDENT_AMBULATORY_CARE_PROVIDER_SITE_OTHER): Payer: Medicare Other | Admitting: *Deleted

## 2014-03-12 DIAGNOSIS — Z23 Encounter for immunization: Secondary | ICD-10-CM

## 2014-04-17 ENCOUNTER — Other Ambulatory Visit: Payer: Self-pay | Admitting: Internal Medicine

## 2014-06-02 LAB — HM MAMMOGRAPHY: HM Mammogram: NEGATIVE

## 2014-06-09 ENCOUNTER — Telehealth (HOSPITAL_COMMUNITY): Payer: Self-pay | Admitting: *Deleted

## 2014-06-09 NOTE — Telephone Encounter (Signed)
Patient called our office back. She said you can call her back at [phone], she said she will listen out for your returned call. I will send Karin GoldenLorraine an e-mail letting her know to call patient back.  I am unable to route this message to her.

## 2014-06-09 NOTE — Telephone Encounter (Signed)
Attempted to call pt on telephone nos. 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

## 2014-06-09 NOTE — Telephone Encounter (Signed)
Reattempted to call pt back to discuss AD and she states she had a "will" done with a lady from outside--not the form at the doctor's office. She also states that she will need to change some things on that will. Reminded pt to discuss her medical will with her doctor on her next visit and she agreed with it.

## 2014-06-19 ENCOUNTER — Encounter: Payer: Self-pay | Admitting: Internal Medicine

## 2014-08-11 ENCOUNTER — Other Ambulatory Visit: Payer: Self-pay | Admitting: Internal Medicine

## 2014-08-13 ENCOUNTER — Telehealth: Payer: Self-pay | Admitting: Internal Medicine

## 2014-08-13 ENCOUNTER — Other Ambulatory Visit: Payer: Self-pay | Admitting: Internal Medicine

## 2014-08-13 MED ORDER — OMEPRAZOLE 20 MG PO CPDR: 20.0000 mg | DELAYED_RELEASE_CAPSULE | Freq: Every morning | ORAL | Status: DC

## 2014-08-13 MED ORDER — BENAZEPRIL HCL 40 MG PO TABS: 40.0000 mg | ORAL_TABLET | Freq: Every morning | ORAL | Status: DC

## 2014-08-13 MED ORDER — PRAVASTATIN SODIUM 40 MG PO TABS: 40.0000 mg | ORAL_TABLET | Freq: Every day | ORAL | Status: DC

## 2014-08-13 MED ORDER — VERAPAMIL HCL 120 MG PO TABS: 120.0000 mg | ORAL_TABLET | Freq: Two times a day (BID) | ORAL | Status: DC

## 2014-08-13 MED ORDER — HYDROCHLOROTHIAZIDE 25 MG PO TABS: 25.0000 mg | ORAL_TABLET | Freq: Every morning | ORAL | Status: DC

## 2014-08-13 NOTE — Telephone Encounter (Signed)
Left detailed message Rx's refills requested were sent to pharmacy.

## 2014-08-13 NOTE — Telephone Encounter (Signed)
Pt request refill of the following: omeprazole (PRILOSEC) 20 MG capsule,   verapamil (CALAN) 120 MG tablet hydrochlorothiazide (HYDRODIURIL) 25 MG tablet, benazepril (LOTENSIN) 40 MG tablet, traMADol (ULTRAM) 50 MG tablet    Phamacy: [facility]

## 2014-09-04 ENCOUNTER — Other Ambulatory Visit: Payer: Self-pay | Admitting: Internal Medicine

## 2014-11-24 ENCOUNTER — Encounter: Payer: Self-pay | Admitting: Internal Medicine

## 2014-11-24 ENCOUNTER — Ambulatory Visit (INDEPENDENT_AMBULATORY_CARE_PROVIDER_SITE_OTHER): Payer: Medicare Other | Admitting: Internal Medicine

## 2014-11-24 VITALS — BP 106/70 | HR 52 | Temp 98.4°F | Resp 20 | Ht 63.25 in | Wt 169.0 lb

## 2014-11-24 DIAGNOSIS — I10 Essential (primary) hypertension: Secondary | ICD-10-CM

## 2014-11-24 DIAGNOSIS — Z86010 Personal history of colonic polyps: Secondary | ICD-10-CM

## 2014-11-24 DIAGNOSIS — Z0000 Encounter for general adult medical examination without abnormal findings: Secondary | ICD-10-CM

## 2014-11-24 DIAGNOSIS — E785 Hyperlipidemia, unspecified: Secondary | ICD-10-CM

## 2014-11-24 LAB — CBC WITH DIFFERENTIAL/PLATELET
Basophils Absolute: 0 10*3/uL (ref 0.0–0.1)
Basophils Relative: 0.5 % (ref 0.0–3.0)
Eosinophils Absolute: 0.1 10*3/uL (ref 0.0–0.7)
Eosinophils Relative: 1.6 % (ref 0.0–5.0)
HCT: 37.1 % (ref 36.0–46.0)
Hemoglobin: 12.2 g/dL (ref 12.0–15.0)
Lymphocytes Relative: 41.4 % (ref 12.0–46.0)
Lymphs Abs: 1.7 10*3/uL (ref 0.7–4.0)
MCHC: 32.8 g/dL (ref 30.0–36.0)
MCV: 97.6 fl (ref 78.0–100.0)
Monocytes Absolute: 0.4 10*3/uL (ref 0.1–1.0)
Monocytes Relative: 10.2 % (ref 3.0–12.0)
Neutro Abs: 1.9 10*3/uL (ref 1.4–7.7)
Neutrophils Relative %: 46.3 % (ref 43.0–77.0)
Platelets: 224 10*3/uL (ref 150.0–400.0)
RBC: 3.8 Mil/uL — ABNORMAL LOW (ref 3.87–5.11)
RDW: 13.6 % (ref 11.5–15.5)
WBC: 4.1 10*3/uL (ref 4.0–10.5)

## 2014-11-24 LAB — COMPREHENSIVE METABOLIC PANEL
ALT: 11 U/L (ref 0–35)
AST: 18 U/L (ref 0–37)
Albumin: 4 g/dL (ref 3.5–5.2)
Alkaline Phosphatase: 37 U/L — ABNORMAL LOW (ref 39–117)
BUN: 12 mg/dL (ref 6–23)
CO2: 33 mEq/L — ABNORMAL HIGH (ref 19–32)
Calcium: 9.5 mg/dL (ref 8.4–10.5)
Chloride: 102 mEq/L (ref 96–112)
Creatinine, Ser: 0.86 mg/dL (ref 0.40–1.20)
GFR: 84.58 mL/min (ref >60.00)
Glucose, Bld: 84 mg/dL (ref 70–99)
Potassium: 3.2 mEq/L — ABNORMAL LOW (ref 3.5–5.1)
Sodium: 141 mEq/L (ref 135–145)
Total Bilirubin: 0.4 mg/dL (ref 0.2–1.2)
Total Protein: 6.3 g/dL (ref 6.0–8.3)

## 2014-11-24 LAB — LIPID PANEL
Cholesterol: 164 mg/dL (ref 0–200)
HDL: 59.1 mg/dL (ref >39.00)
LDL Cholesterol: 91 mg/dL (ref 0–99)
NonHDL: 104.9
Total CHOL/HDL Ratio: 3
Triglycerides: 68 mg/dL (ref 0.0–149.0)
VLDL: 13.6 mg/dL (ref 0.0–40.0)

## 2014-11-24 LAB — TSH: TSH: 1.31 u[IU]/mL (ref 0.35–4.50)

## 2014-11-24 MED ORDER — VERAPAMIL HCL 120 MG PO TABS: 120.0000 mg | ORAL_TABLET | Freq: Two times a day (BID) | ORAL | Status: DC

## 2014-11-24 MED ORDER — BENAZEPRIL HCL 40 MG PO TABS: 40.0000 mg | ORAL_TABLET | Freq: Every morning | ORAL | Status: DC

## 2014-11-24 MED ORDER — HYDROCHLOROTHIAZIDE 25 MG PO TABS: 25.0000 mg | ORAL_TABLET | Freq: Every morning | ORAL | Status: DC

## 2014-11-24 MED ORDER — PRAVASTATIN SODIUM 40 MG PO TABS: 40.0000 mg | ORAL_TABLET | Freq: Every day | ORAL | Status: DC

## 2014-11-24 MED ORDER — METOPROLOL TARTRATE 25 MG PO TABS: 25.0000 mg | ORAL_TABLET | Freq: Two times a day (BID) | ORAL | Status: DC

## 2014-11-24 MED ORDER — OMEPRAZOLE 20 MG PO CPDR: 20.0000 mg | DELAYED_RELEASE_CAPSULE | Freq: Every morning | ORAL | Status: DC

## 2014-11-24 MED ORDER — TRAMADOL HCL 50 MG PO TABS: 50.0000 mg | ORAL_TABLET | Freq: Four times a day (QID) | ORAL | Status: DC | PRN

## 2014-11-24 NOTE — Patient Instructions (Addendum)
Limit your sodium (Salt) intake    It is important that you exercise regularly, at least 20 minutes 3 to 4 times per week.  If you develop chest pain or shortness of breath seek  medical attention.  You need to lose weight.  Consider a lower calorie diet and regular exercise.  Yearly eye exam.  Continue colonoscopies at five-year interval and mammograms every 1-2 years  Return in one year for follow-up   Health Maintenance Adopting a healthy lifestyle and getting preventive care can go a long way to promote health and wellness. Talk with your health care provider about what schedule of regular examinations is right for you. This is a good chance for you to check in with your provider about disease prevention and staying healthy. In between checkups, there are plenty of things you can do on your own. Experts have done a lot of research about which lifestyle changes and preventive measures are most likely to keep you healthy. Ask your health care provider for more information. WEIGHT AND DIET  Eat a healthy diet  Be sure to include plenty of vegetables, fruits, low-fat dairy products, and lean protein.  Do not eat a lot of foods high in solid fats, added sugars, or salt.  Get regular exercise. This is one of the most important things you can do for your health.  Most adults should exercise for at least 150 minutes each week. The exercise should increase your heart rate and make you sweat (moderate-intensity exercise).  Most adults should also do strengthening exercises at least twice a week. This is in addition to the moderate-intensity exercise.  Maintain a healthy weight  Body mass index (BMI) is a measurement that can be used to identify possible weight problems. It estimates body fat based on height and weight. Your health care provider can help determine your BMI and help you achieve or maintain a healthy weight.  For females 66 years of age and older:   A BMI below 18.5 is  considered underweight.  A BMI of 18.5 to 24.9 is normal.  A BMI of 25 to 29.9 is considered overweight.  A BMI of 30 and above is considered obese.  Watch levels of cholesterol and blood lipids  You should start having your blood tested for lipids and cholesterol at 67 years of age, then have this test every 5 years.  You may need to have your cholesterol levels checked more often if:  Your lipid or cholesterol levels are high.  You are older than 67 years of age.  You are at high risk for heart disease.  CANCER SCREENING   Lung Cancer  Lung cancer screening is recommended for adults 7-36 years old who are at high risk for lung cancer because of a history of smoking.  A yearly low-dose CT scan of the lungs is recommended for people who:  Currently smoke.  Have quit within the past 15 years.  Have at least a 30-pack-year history of smoking. A pack year is smoking an average of one pack of cigarettes a day for 1 year.  Yearly screening should continue until it has been 15 years since you quit.  Yearly screening should stop if you develop a health problem that would prevent you from having lung cancer treatment.  Breast Cancer  Practice breast self-awareness. This means understanding how your breasts normally appear and feel.  It also means doing regular breast self-exams. Let your health care provider know about any changes, no matter  how small.  If you are in your 20s or 30s, you should have a clinical breast exam (CBE) by a health care provider every 1-3 years as part of a regular health exam.  If you are 51 or older, have a CBE every year. Also consider having a breast X-ray (mammogram) every year.  If you have a family history of breast cancer, talk to your health care provider about genetic screening.  If you are at high risk for breast cancer, talk to your health care provider about having an MRI and a mammogram every year.  Breast cancer gene (BRCA)  assessment is recommended for women who have family members with BRCA-related cancers. BRCA-related cancers include:  Breast.  Ovarian.  Tubal.  Peritoneal cancers.  Results of the assessment will determine the need for genetic counseling and BRCA1 and BRCA2 testing. Cervical Cancer Routine pelvic examinations to screen for cervical cancer are no longer recommended for nonpregnant women who are considered low risk for cancer of the pelvic organs (ovaries, uterus, and vagina) and who do not have symptoms. A pelvic examination may be necessary if you have symptoms including those associated with pelvic infections. Ask your health care provider if a screening pelvic exam is right for you.   The Pap test is the screening test for cervical cancer for women who are considered at risk.  If you had a hysterectomy for a problem that was not cancer or a condition that could lead to cancer, then you no longer need Pap tests.  If you are older than 65 years, and you have had normal Pap tests for the past 10 years, you no longer need to have Pap tests.  If you have had past treatment for cervical cancer or a condition that could lead to cancer, you need Pap tests and screening for cancer for at least 20 years after your treatment.  If you no longer get a Pap test, assess your risk factors if they change (such as having a new sexual partner). This can affect whether you should start being screened again.  Some women have medical problems that increase their chance of getting cervical cancer. If this is the case for you, your health care provider may recommend more frequent screening and Pap tests.  The human papillomavirus (HPV) test is another test that may be used for cervical cancer screening. The HPV test looks for the virus that can cause cell changes in the cervix. The cells collected during the Pap test can be tested for HPV.  The HPV test can be used to screen women 84 years of age and older.  Getting tested for HPV can extend the interval between normal Pap tests from three to five years.  An HPV test also should be used to screen women of any age who have unclear Pap test results.  After 67 years of age, women should have HPV testing as often as Pap tests.  Colorectal Cancer  This type of cancer can be detected and often prevented.  Routine colorectal cancer screening usually begins at 67 years of age and continues through 67 years of age.  Your health care provider may recommend screening at an earlier age if you have risk factors for colon cancer.  Your health care provider may also recommend using home test kits to check for hidden blood in the stool.  A small camera at the end of a tube can be used to examine your colon directly (sigmoidoscopy or colonoscopy). This is done  to check for the earliest forms of colorectal cancer.  Routine screening usually begins at age 50.  Direct examination of the colon should be repeated every 5-10 years through 67 years of age. However, you may need to be screened more often if early forms of precancerous polyps or small growths are found. Skin Cancer  Check your skin from head to toe regularly.  Tell your health care provider about any new moles or changes in moles, especially if there is a change in a mole's shape or color.  Also tell your health care provider if you have a mole that is larger than the size of a pencil eraser.  Always use sunscreen. Apply sunscreen liberally and repeatedly throughout the day.  Protect yourself by wearing long sleeves, pants, a wide-brimmed hat, and sunglasses whenever you are outside. HEART DISEASE, DIABETES, AND HIGH BLOOD PRESSURE   Have your blood pressure checked at least every 1-2 years. High blood pressure causes heart disease and increases the risk of stroke.  If you are between 55 years and 79 years old, ask your health care provider if you should take aspirin to prevent  strokes.  Have regular diabetes screenings. This involves taking a blood sample to check your fasting blood sugar level.  If you are at a normal weight and have a low risk for diabetes, have this test once every three years after 67 years of age.  If you are overweight and have a high risk for diabetes, consider being tested at a younger age or more often. PREVENTING INFECTION  Hepatitis B  If you have a higher risk for hepatitis B, you should be screened for this virus. You are considered at high risk for hepatitis B if:  You were born in a country where hepatitis B is common. Ask your health care provider which countries are considered high risk.  Your parents were born in a high-risk country, and you have not been immunized against hepatitis B (hepatitis B vaccine).  You have HIV or AIDS.  You use needles to inject street drugs.  You live with someone who has hepatitis B.  You have had sex with someone who has hepatitis B.  You get hemodialysis treatment.  You take certain medicines for conditions, including cancer, organ transplantation, and autoimmune conditions. Hepatitis C  Blood testing is recommended for:  Everyone born from 1945 through 1965.  Anyone with known risk factors for hepatitis C. Sexually transmitted infections (STIs)  You should be screened for sexually transmitted infections (STIs) including gonorrhea and chlamydia if:  You are sexually active and are younger than 67 years of age.  You are older than 67 years of age and your health care provider tells you that you are at risk for this type of infection.  Your sexual activity has changed since you were last screened and you are at an increased risk for chlamydia or gonorrhea. Ask your health care provider if you are at risk.  If you do not have HIV, but are at risk, it may be recommended that you take a prescription medicine daily to prevent HIV infection. This is called pre-exposure prophylaxis  (PrEP). You are considered at risk if:  You are sexually active and do not regularly use condoms or know the HIV status of your partner(s).  You take drugs by injection.  You are sexually active with a partner who has HIV. Talk with your health care provider about whether you are at high risk of being infected with   HIV. If you choose to begin PrEP, you should first be tested for HIV. You should then be tested every 3 months for as long as you are taking PrEP.  PREGNANCY   If you are premenopausal and you may become pregnant, ask your health care provider about preconception counseling.  If you may become pregnant, take 400 to 800 micrograms (mcg) of folic acid every day.  If you want to prevent pregnancy, talk to your health care provider about birth control (contraception). OSTEOPOROSIS AND MENOPAUSE   Osteoporosis is a disease in which the bones lose minerals and strength with aging. This can result in serious bone fractures. Your risk for osteoporosis can be identified using a bone density scan.  If you are 65 years of age or older, or if you are at risk for osteoporosis and fractures, ask your health care provider if you should be screened.  Ask your health care provider whether you should take a calcium or vitamin D supplement to lower your risk for osteoporosis.  Menopause may have certain physical symptoms and risks.  Hormone replacement therapy may reduce some of these symptoms and risks. Talk to your health care provider about whether hormone replacement therapy is right for you.  HOME CARE INSTRUCTIONS   Schedule regular health, dental, and eye exams.  Stay current with your immunizations.   Do not use any tobacco products including cigarettes, chewing tobacco, or electronic cigarettes.  If you are pregnant, do not drink alcohol.  If you are breastfeeding, limit how much and how often you drink alcohol.  Limit alcohol intake to no more than 1 drink per day for  nonpregnant women. One drink equals 12 ounces of beer, 5 ounces of wine, or 1 ounces of hard liquor.  Do not use street drugs.  Do not share needles.  Ask your health care provider for help if you need support or information about quitting drugs.  Tell your health care provider if you often feel depressed.  Tell your health care provider if you have ever been abused or do not feel safe at home. Document Released: 12/11/2010 Document Revised: 10/12/2013 Document Reviewed: 04/29/2013 [employer] Patient Information 2015 [employer]. This information is not intended to replace advice given to you by your health care provider. Make sure you discuss any questions you have with your health care provider.  

## 2014-11-24 NOTE — Progress Notes (Signed)
Pre visit review using our clinic review tool, if applicable. No additional management support is needed unless otherwise documented below in the visit note. 

## 2014-11-24 NOTE — Progress Notes (Signed)
Subjective:    Patient ID: Tanya Gray, female    DOB: 09-27-1945, 69 y.o.   MRN: [id number]  HPI 69  -year-old patient who is seen today for a preventive health examination.  Medical problems include treated hypertension. She has a history of cardiomegaly and mild aortic insufficiency and is followed by cardiology;  EKG last year was normal. She denies any cardiopulmonary complaints. She does have some osteoarthritis. She has a history of esophageal stricture and remains on chronic PPI therapy. No real concerns or complaints today she remains on pravastatin  for dyslipidemia which she continues to tolerate well.  Allergies (verified):  No Known Drug Allergies   Past History:  Past Medical History:   1. Hypertension.  2. Degenerative joint disease.  3. Echocardiogram, August 2008 which showed mild LVH, EF 65%, mild aortic insufficiency.  4. History of esophageal stricture status post dilatation, August 2009.  5. Peptic ulcer disease.  6. Hypercholesteremia.  7. Atypical chest pain. Exercise treadmill test in September 2009. The patient exercised for 7 minutes 30 seconds. There was no evidence for ischemia by EKG.  8. Asthma  9. Diverticulosis, colon   Family History:   Mother with DM II. HTN. No MI, cancer, lung/GI disease.  No FH of Colon Cancer:  Father- unknown status  One sister: died cerebral aneurysm    Past Medical History  Diagnosis Date  . AORTIC INSUFFICIENCY 08/03/2009  . Cardiomegaly 01/29/2007  . Diverticulosis of colon (without mention of hemorrhage) 08/08/2006  . ESOPHAGEAL STRICTURE 02/19/2008  . Heartburn 02/19/2008  . HYPERLIPIDEMIA 02/18/2008  . HYPERTENSION, BENIGN SYSTEMIC 08/08/2006  . MYALGIA 01/29/2007  . OSTEOARTHRITIS 12/21/2008  . PEPTIC ULCER DISEASE 12/21/2008  . RHINITIS, ALLERGIC 08/08/2006  . Unspecified asthma(493.90) 08/08/2006    History   Social History  . Marital Status: Single    Spouse Name: N/A  . Number of Children: N/A  .  Years of Education: N/A   Occupational History  . Not on file.   Social History Main Topics  . Smoking status: Former Smoker    Quit date: 06/12/2003  . Smokeless tobacco: Never Used  . Alcohol Use: 0.0 oz/week    1-2 Cans of beer per week  . Drug Use: No  . Sexual Activity: Not on file   Other Topics Concern  . Not on file   Social History Narrative    Past Surgical History  Procedure Laterality Date  . Abdominal hysterectomy    . Esophagogastroduodenoscopy    . Endotracheal intubation emergent      Family History  Problem Relation Age of Onset  . Diabetes Mother   . Hypertension Mother   . Lung disease Mother   . Aneurysm Sister     cerebral  . Colon cancer Neg Hx   . Esophageal cancer Neg Hx   . Rectal cancer Neg Hx   . Stomach cancer Neg Hx     No Known Allergies  Current Outpatient Prescriptions on File Prior to Visit  Medication Sig Dispense Refill  . aspirin 81 MG tablet Take 81 mg by mouth daily.      . calcium-vitamin D (OSCAL) 250-125 MG-UNIT per tablet Take 1 tablet by mouth daily.       No current facility-administered medications on file prior to visit.    BP 106/70 mmHg  Pulse 52  Temp(Src) 98.4 F (36.9 C) (Oral)  Resp 20  Ht 5' 3.25" (1.607 m)  Wt 169 lb (76.658 kg)  BMI 29.68 kg/m2  SpO2 99%  1. Risk factors, based on past  M,S,F history- cardiovascular risk factors include hypertension, and dyslipidemia.  She has a history of trivial aortic stenosis and mild cardiomegaly  2.  Physical activities: No limitations  3.  Depression/mood: No history of depression or mood disorder  4.  Hearing: No deficits  5.  ADL's: Completely independent  6.  Fall risk: Low  7.  Home safety: No problems identified  8.  Height weight, and visual acuity; hard weight stable.  No change in visual acuity  9.  Counseling: Heart healthy diet.  Encouraged regular exercise and modest weight loss.  All discussed  10. Lab orders based on risk factors:  Laboratory update will be reviewed  11. Referral : Ophthalmology  12. Care plan: Refer to ophthalmology.   13. Cognitive assessment: Alert and oriented.  Normal affect.  No cognitive dysfunction  14.  Preventive services will include colonoscopies at five-year intervals.  In view of her colonic polyps.  Yearly eye examinations.  Encouraged.  Annual clinical exams with screening lab.  Also recommended.  Annual mammograms recommended.  Patient was provided with a written and personalized care plan  15.  Provider list update includes ophthalmology radiology as well as primary care medicine.         Review of Systems  Constitutional: Negative for fever, appetite change, fatigue and unexpected weight change.  HENT: Negative for congestion, dental problem, ear pain, hearing loss, mouth sores, nosebleeds, sinus pressure, sore throat, tinnitus, trouble swallowing and voice change.   Eyes: Negative for photophobia, pain, redness and visual disturbance.  Respiratory: Negative for cough, chest tightness and shortness of breath.   Cardiovascular: Negative for chest pain, palpitations and leg swelling.  Gastrointestinal: Negative for nausea, vomiting, abdominal pain, diarrhea, constipation, blood in stool, abdominal distention and rectal pain.  Genitourinary: Negative for dysuria, urgency, frequency, hematuria, flank pain, vaginal bleeding, vaginal discharge, difficulty urinating, genital sores, vaginal pain, menstrual problem and pelvic pain.  Musculoskeletal: Positive for back pain and arthralgias. Negative for neck stiffness.  Skin: Negative for rash.  Neurological: Negative for dizziness, syncope, speech difficulty, weakness, light-headedness, numbness and headaches.  Hematological: Negative for adenopathy. Does not bruise/bleed easily.  Psychiatric/Behavioral: Negative for suicidal ideas, behavioral problems, self-injury, dysphoric mood and agitation. The patient is not nervous/anxious.         Objective:   Physical Exam  Constitutional: She is oriented to person, place, and time. She appears well-developed and well-nourished.  Blood pressure 160/100 both arms  HENT:  Head: Normocephalic and atraumatic.  Right Ear: External ear normal.  Left Ear: External ear normal.  Mouth/Throat: Oropharynx is clear and moist.  Eyes: Conjunctivae and EOM are normal.  Neck: Normal range of motion. Neck supple. No JVD present. No thyromegaly present.  Cardiovascular: Normal rate, regular rhythm, normal heart sounds and intact distal pulses.   No murmur heard. Grade 1/6 systolic murmur at the primary aortic area. No murmur of AI appreciated  Pulmonary/Chest: Effort normal and breath sounds normal. She has no wheezes. She has no rales.  Abdominal: Soft. Bowel sounds are normal. She exhibits no distension and no mass. There is no tenderness. There is no rebound and no guarding.  Musculoskeletal: Normal range of motion. She exhibits no edema or tenderness.  Neurological: She is alert and oriented to person, place, and time. She has normal reflexes. No cranial nerve deficit. She exhibits normal muscle tone. Coordination normal.  Skin: Skin is warm and dry. No rash noted.  Psychiatric:  She has a normal mood and affect. Her behavior is normal.          Assessment & Plan:   Preventive health exam Hypertension suboptimal control. We'll resume amlodipine 5 mg daily. Has been on as high a dose as 10 mg in the past. Home blood pressure monitor and encouraged we'll recheck in 4 weeks Aortic insufficiency. This appears to be trivial no murmur of AI noted today History of esophageal stricture. We'll continue PPI therapy Dyslipidemia stable

## 2014-11-26 ENCOUNTER — Other Ambulatory Visit: Payer: Self-pay | Admitting: *Deleted

## 2014-11-26 MED ORDER — POTASSIUM CHLORIDE CRYS ER 20 MEQ PO TBCR: 20.0000 meq | EXTENDED_RELEASE_TABLET | Freq: Every day | ORAL | Status: DC

## 2015-03-23 ENCOUNTER — Ambulatory Visit (INDEPENDENT_AMBULATORY_CARE_PROVIDER_SITE_OTHER): Payer: Medicare Other | Admitting: *Deleted

## 2015-03-23 DIAGNOSIS — Z23 Encounter for immunization: Secondary | ICD-10-CM | POA: Diagnosis not present

## 2015-04-13 ENCOUNTER — Encounter: Payer: Self-pay | Admitting: Gastroenterology

## 2015-06-07 DIAGNOSIS — Z1231 Encounter for screening mammogram for malignant neoplasm of breast: Secondary | ICD-10-CM | POA: Diagnosis not present

## 2015-06-07 LAB — HM MAMMOGRAPHY

## 2015-06-15 ENCOUNTER — Encounter: Payer: Medicare Other | Admitting: Internal Medicine

## 2015-06-23 ENCOUNTER — Encounter: Payer: Self-pay | Admitting: Internal Medicine

## 2015-08-15 ENCOUNTER — Other Ambulatory Visit: Payer: Self-pay | Admitting: Internal Medicine

## 2015-11-12 ENCOUNTER — Other Ambulatory Visit: Payer: Self-pay | Admitting: Internal Medicine

## 2016-02-11 ENCOUNTER — Other Ambulatory Visit: Payer: Self-pay | Admitting: Internal Medicine

## 2016-02-17 ENCOUNTER — Telehealth: Payer: Self-pay | Admitting: Internal Medicine

## 2016-02-17 MED ORDER — METOPROLOL TARTRATE 25 MG PO TABS: 25.0000 mg | ORAL_TABLET | Freq: Two times a day (BID) | ORAL | 0 refills | Status: DC

## 2016-02-17 NOTE — Telephone Encounter (Signed)
° °  Pt request refill of the following:  metoprolol tartrate (LOPRESSOR) 25 MG tablet   [facility] [place]

## 2016-02-17 NOTE — Telephone Encounter (Signed)
Left message on personal voicemail Rx sent to pharmacy. 

## 2016-03-07 ENCOUNTER — Encounter: Payer: Self-pay | Admitting: Internal Medicine

## 2016-03-07 ENCOUNTER — Ambulatory Visit (INDEPENDENT_AMBULATORY_CARE_PROVIDER_SITE_OTHER): Payer: Medicare Other | Admitting: Internal Medicine

## 2016-03-07 VITALS — BP 102/70 | HR 51 | Temp 98.6°F | Resp 20 | Ht 62.5 in | Wt 156.2 lb

## 2016-03-07 DIAGNOSIS — I10 Essential (primary) hypertension: Secondary | ICD-10-CM

## 2016-03-07 DIAGNOSIS — Z23 Encounter for immunization: Secondary | ICD-10-CM

## 2016-03-07 DIAGNOSIS — Z0000 Encounter for general adult medical examination without abnormal findings: Secondary | ICD-10-CM | POA: Diagnosis not present

## 2016-03-07 DIAGNOSIS — E785 Hyperlipidemia, unspecified: Secondary | ICD-10-CM

## 2016-03-07 LAB — CBC WITH DIFFERENTIAL/PLATELET
Basophils Absolute: 0 10*3/uL (ref 0.0–0.1)
Basophils Relative: 0.7 % (ref 0.0–3.0)
Eosinophils Absolute: 0 10*3/uL (ref 0.0–0.7)
Eosinophils Relative: 0.7 % (ref 0.0–5.0)
HCT: 37.6 % (ref 36.0–46.0)
Hemoglobin: 12.4 g/dL (ref 12.0–15.0)
Lymphocytes Relative: 34.5 % (ref 12.0–46.0)
Lymphs Abs: 1.6 10*3/uL (ref 0.7–4.0)
MCHC: 32.9 g/dL (ref 30.0–36.0)
MCV: 97.9 fl (ref 78.0–100.0)
Monocytes Absolute: 0.3 10*3/uL (ref 0.1–1.0)
Monocytes Relative: 7.2 % (ref 3.0–12.0)
Neutro Abs: 2.6 10*3/uL (ref 1.4–7.7)
Neutrophils Relative %: 56.9 % (ref 43.0–77.0)
Platelets: 210 10*3/uL (ref 150.0–400.0)
RBC: 3.84 Mil/uL — ABNORMAL LOW (ref 3.87–5.11)
RDW: 13.5 % (ref 11.5–15.5)
WBC: 4.5 10*3/uL (ref 4.0–10.5)

## 2016-03-07 LAB — LIPID PANEL
Cholesterol: 146 mg/dL (ref 0–200)
HDL: 63.4 mg/dL (ref >39.00)
LDL Cholesterol: 63 mg/dL (ref 0–99)
NonHDL: 83.02
Total CHOL/HDL Ratio: 2
Triglycerides: 101 mg/dL (ref 0.0–149.0)
VLDL: 20.2 mg/dL (ref 0.0–40.0)

## 2016-03-07 LAB — COMPREHENSIVE METABOLIC PANEL
ALT: 17 U/L (ref 0–35)
AST: 21 U/L (ref 0–37)
Albumin: 3.8 g/dL (ref 3.5–5.2)
Alkaline Phosphatase: 44 U/L (ref 39–117)
BUN: 20 mg/dL (ref 6–23)
CO2: 32 mEq/L (ref 19–32)
Calcium: 9.1 mg/dL (ref 8.4–10.5)
Chloride: 103 mEq/L (ref 96–112)
Creatinine, Ser: 1.19 mg/dL (ref 0.40–1.20)
GFR: 57.92 mL/min — ABNORMAL LOW (ref >60.00)
Glucose, Bld: 95 mg/dL (ref 70–99)
Potassium: 3.5 mEq/L (ref 3.5–5.1)
Sodium: 143 mEq/L (ref 135–145)
Total Bilirubin: 0.5 mg/dL (ref 0.2–1.2)
Total Protein: 6.1 g/dL (ref 6.0–8.3)

## 2016-03-07 LAB — TSH: TSH: 1.82 u[IU]/mL (ref 0.35–4.50)

## 2016-03-07 MED ORDER — METOPROLOL TARTRATE 25 MG PO TABS: 25.0000 mg | ORAL_TABLET | Freq: Two times a day (BID) | ORAL | 0 refills | Status: DC

## 2016-03-07 MED ORDER — PRAVASTATIN SODIUM 40 MG PO TABS: 40.0000 mg | ORAL_TABLET | Freq: Every day | ORAL | 3 refills | Status: DC

## 2016-03-07 MED ORDER — POTASSIUM CHLORIDE CRYS ER 20 MEQ PO TBCR: 20.0000 meq | EXTENDED_RELEASE_TABLET | Freq: Every day | ORAL | 3 refills | Status: DC

## 2016-03-07 MED ORDER — BENAZEPRIL HCL 40 MG PO TABS: ORAL_TABLET | ORAL | 3 refills | Status: DC

## 2016-03-07 MED ORDER — VERAPAMIL HCL 120 MG PO TABS: 120.0000 mg | ORAL_TABLET | Freq: Two times a day (BID) | ORAL | 3 refills | Status: DC

## 2016-03-07 MED ORDER — HYDROCHLOROTHIAZIDE 25 MG PO TABS: ORAL_TABLET | ORAL | 3 refills | Status: DC

## 2016-03-07 MED ORDER — TRAMADOL HCL 50 MG PO TABS: 50.0000 mg | ORAL_TABLET | Freq: Four times a day (QID) | ORAL | 5 refills | Status: DC | PRN

## 2016-03-07 MED ORDER — OMEPRAZOLE 20 MG PO CPDR: DELAYED_RELEASE_CAPSULE | ORAL | 3 refills | Status: DC

## 2016-03-07 NOTE — Progress Notes (Signed)
Subjective:    Patient ID: Tanya Gray, female    DOB: 02/09/1946, 70 y.o.   MRN: [id number]  HPI 70   -year-old patient who is seen today for a preventive health examination.  Medical problems include treated hypertension. She has a history of cardiomegaly and mild aortic insufficiency and is followed by cardiology;  EKG last year was normal. She denies any cardiopulmonary complaints. She does have some osteoarthritis. She has a history of esophageal stricture and remains on chronic PPI therapy. No real concerns or complaints today;  she remains on pravastatin  for dyslipidemia which she continues to tolerate well. She has a history colonic polyps and her last colonoscopy was January 2015.  A seven-year interval suggested. No new concerns or complaints.  Did have a mammogram December 2016 She does monitor home blood pressure readings with low normal readings Patient does have a history of hypokalemia on diuretic therapy and now is on a potassium supplement  Allergies (verified):  No Known Drug Allergies   Past History:  Past Medical History:   1. Hypertension.  2. Degenerative joint disease.  3. Echocardiogram, August 2008 which showed mild LVH, EF 65%, mild aortic insufficiency.  4. History of esophageal stricture status post dilatation, August 2009.  5. Peptic ulcer disease.  6. Hypercholesteremia.  7. Atypical chest pain. Exercise treadmill test in September 2009. The patient exercised for 7 minutes 30 seconds. There was no evidence for ischemia by EKG.  8. Asthma  9. Diverticulosis, colon   Family History:   Mother with DM II. HTN. No MI, cancer, lung/GI disease.  No FH of Colon Cancer:  Father- unknown status  One sister: died cerebral aneurysm    Past Medical History:  Diagnosis Date  . AORTIC INSUFFICIENCY 08/03/2009  . Cardiomegaly 01/29/2007  . Diverticulosis of colon (without mention of hemorrhage) 08/08/2006  . ESOPHAGEAL STRICTURE 02/19/2008  . Heartburn  02/19/2008  . HYPERLIPIDEMIA 02/18/2008  . HYPERTENSION, BENIGN SYSTEMIC 08/08/2006  . MYALGIA 01/29/2007  . OSTEOARTHRITIS 12/21/2008  . PEPTIC ULCER DISEASE 12/21/2008  . RHINITIS, ALLERGIC 08/08/2006  . Unspecified asthma(493.90) 08/08/2006    Social History   Social History  . Marital status: Single    Spouse name: N/A  . Number of children: N/A  . Years of education: N/A   Occupational History  . Not on file.   Social History Main Topics  . Smoking status: Former Smoker    Quit date: 06/12/2003  . Smokeless tobacco: Never Used  . Alcohol use 0.0 oz/week    1 - 2 Cans of beer per week  . Drug use: No  . Sexual activity: Not on file   Other Topics Concern  . Not on file   Social History Narrative  . No narrative on file    Past Surgical History:  Procedure Laterality Date  . ABDOMINAL HYSTERECTOMY    . ENDOTRACHEAL INTUBATION EMERGENT    . ESOPHAGOGASTRODUODENOSCOPY      Family History  Problem Relation Age of Onset  . Diabetes Mother   . Hypertension Mother   . Lung disease Mother   . Aneurysm Sister     cerebral  . Colon cancer Neg Hx   . Esophageal cancer Neg Hx   . Rectal cancer Neg Hx   . Stomach cancer Neg Hx     No Known Allergies  Current Outpatient Prescriptions on File Prior to Visit  Medication Sig Dispense Refill  . aspirin 81 MG tablet Take 81 mg by mouth daily.      .Marland Kitchen  calcium-vitamin D (OSCAL) 250-125 MG-UNIT per tablet Take 1 tablet by mouth daily.       No current facility-administered medications on file prior to visit.     BP 102/70 (BP Location: Left Arm, Patient Position: Sitting, Cuff Size: Normal)   Pulse (!) 51   Temp 98.6 F (37 C) (Oral)   Resp 20   Ht 5' 2.5" (1.588 m)   Wt 156 lb 4 oz (70.9 kg)   SpO2 98%   BMI 28.12 kg/m   1. Risk factors, based on past  M,S,F history- cardiovascular risk factors include hypertension, and dyslipidemia.  She has a history of trivial aortic stenosis and mild cardiomegaly  2.  Physical  activities: No limitations  3.  Depression/mood: No history of depression or mood disorder  4.  Hearing: No deficits  5.  ADL's: Completely independent  6.  Fall risk: Low  7.  Home safety: No problems identified  8.  Height weight, and visual acuity; hard weight stable.  No change in visual acuity  9.  Counseling: Heart healthy diet.  Encouraged regular exercise and modest weight loss.  All discussed  10. Lab orders based on risk factors: Laboratory update will be reviewed  11. Referral : Ophthalmology  12. Care plan: Refer to ophthalmology.   13. Cognitive assessment: Alert and oriented.  Normal affect.  No cognitive dysfunction  14.  Preventive services will include colonoscopies at five-year intervals.  In view of her colonic polyps.  Yearly eye examinations.  Encouraged.  Annual clinical exams with screening lab.  Also recommended.  Annual mammograms recommended.  Patient was provided with a written and personalized care plan  15.  Provider list update includes ophthalmology radiology as well as primary care medicine.         Review of Systems  Constitutional: Negative for appetite change, fatigue, fever and unexpected weight change.  HENT: Negative for congestion, dental problem, ear pain, hearing loss, mouth sores, nosebleeds, sinus pressure, sore throat, tinnitus, trouble swallowing and voice change.   Eyes: Negative for photophobia, pain, redness and visual disturbance.  Respiratory: Negative for cough, chest tightness and shortness of breath.   Cardiovascular: Negative for chest pain, palpitations and leg swelling.  Gastrointestinal: Negative for abdominal distention, abdominal pain, blood in stool, constipation, diarrhea, nausea, rectal pain and vomiting.  Genitourinary: Negative for difficulty urinating, dysuria, flank pain, frequency, genital sores, hematuria, menstrual problem, pelvic pain, urgency, vaginal bleeding, vaginal discharge and vaginal pain.   Musculoskeletal: Positive for arthralgias and back pain. Negative for neck stiffness.  Skin: Negative for rash.  Neurological: Negative for dizziness, syncope, speech difficulty, weakness, light-headedness, numbness and headaches.  Hematological: Negative for adenopathy. Does not bruise/bleed easily.  Psychiatric/Behavioral: Negative for agitation, behavioral problems, dysphoric mood, self-injury and suicidal ideas. The patient is not nervous/anxious.        Objective:   Physical Exam  Constitutional: She is oriented to person, place, and time. She appears well-developed and well-nourished.  Blood pressure 120/64  HENT:  Head: Normocephalic and atraumatic.  Right Ear: External ear normal.  Left Ear: External ear normal.  Mouth/Throat: Oropharynx is clear and moist.  Eyes: Conjunctivae and EOM are normal.  Neck: Normal range of motion. Neck supple. No JVD present. No thyromegaly present.  Cardiovascular: Normal rate, regular rhythm, normal heart sounds and intact distal pulses.   No murmur heard. Grade 1/6 systolic murmur at the primary aortic area. No murmur of AI appreciated  Pulmonary/Chest: Effort normal and breath sounds normal. She  has no wheezes. She has no rales.  Abdominal: Soft. Bowel sounds are normal. She exhibits no distension and no mass. There is no tenderness. There is no rebound and no guarding.  Musculoskeletal: Normal range of motion. She exhibits no edema or tenderness.  Neurological: She is alert and oriented to person, place, and time. She has normal reflexes. No cranial nerve deficit. She exhibits normal muscle tone. Coordination normal.  Skin: Skin is warm and dry. No rash noted.  Psychiatric: She has a normal mood and affect. Her behavior is normal.          Assessment & Plan:   Preventive health exam Hypertension , Well-controlled Aortic insufficiency. This appears to be trivial no murmur of AI noted today History of esophageal stricture. We'll  continue PPI therapy Dyslipidemia stable  History colonic polyps.  Consider follow-up colonoscopy in 2020  Will review updated lab Flu vaccine administered Continue home blood pressure monitoring Recheck one year   Rogelia Boga

## 2016-03-07 NOTE — Patient Instructions (Addendum)
Limit your sodium (Salt) intake    It is important that you exercise regularly, at least 20 minutes 3 to 4 times per week.  If you develop chest pain or shortness of breath seek  medical attention.  Please check your blood pressure on a regular basis.  If it is consistently greater than 150/90, please make an office appointment.  Return in one year for follow-up  Menopause is a normal process in which your reproductive ability comes to an end. This process happens gradually over a span of months to years, usually between the ages of 6 and 46. Menopause is complete when you have missed 12 consecutive menstrual periods. It is important to talk with your health care provider about some of the most common conditions that affect postmenopausal women, such as heart disease, cancer, and bone loss (osteoporosis). Adopting a healthy lifestyle and getting preventive care can help to promote your health and wellness. Those actions can also lower your chances of developing some of these common conditions. WHAT SHOULD I KNOW ABOUT MENOPAUSE? During menopause, you may experience a number of symptoms, such as:  Moderate-to-severe hot flashes.  Night sweats.  Decrease in sex drive.  Mood swings.  Headaches.  Tiredness.  Irritability.  Memory problems.  Insomnia. Choosing to treat or not to treat menopausal changes is an individual decision that you make with your health care provider. WHAT SHOULD I KNOW ABOUT HORMONE REPLACEMENT THERAPY AND SUPPLEMENTS? Hormone therapy products are effective for treating symptoms that are associated with menopause, such as hot flashes and night sweats. Hormone replacement carries certain risks, especially as you become older. If you are thinking about using estrogen or estrogen with progestin treatments, discuss the benefits and risks with your health care provider. WHAT SHOULD I KNOW ABOUT HEART DISEASE AND STROKE? Heart disease, heart attack, and stroke become  more likely as you age. This may be due, in part, to the hormonal changes that your body experiences during menopause. These can affect how your body processes dietary fats, triglycerides, and cholesterol. Heart attack and stroke are both medical emergencies. There are many things that you can do to help prevent heart disease and stroke:  Have your blood pressure checked at least every 1-2 years. High blood pressure causes heart disease and increases the risk of stroke.  If you are 65-40 years old, ask your health care provider if you should take aspirin to prevent a heart attack or a stroke.  Do not use any tobacco products, including cigarettes, chewing tobacco, or electronic cigarettes. If you need help quitting, ask your health care provider.  It is important to eat a healthy diet and maintain a healthy weight.  Be sure to include plenty of vegetables, fruits, low-fat dairy products, and lean protein.  Avoid eating foods that are high in solid fats, added sugars, or salt (sodium).  Get regular exercise. This is one of the most important things that you can do for your health.  Try to exercise for at least 150 minutes each week. The type of exercise that you do should increase your heart rate and make you sweat. This is known as moderate-intensity exercise.  Try to do strengthening exercises at least twice each week. Do these in addition to the moderate-intensity exercise.  Know your numbers.Ask your health care provider to check your cholesterol and your blood glucose. Continue to have your blood tested as directed by your health care provider. WHAT SHOULD I KNOW ABOUT CANCER SCREENING? There are several types  of cancer. Take the following steps to reduce your risk and to catch any cancer development as early as possible. Breast Cancer  Practice breast self-awareness.  This means understanding how your breasts normally appear and feel.  It also means doing regular breast  self-exams. Let your health care provider know about any changes, no matter how small.  If you are 46 or older, have a clinician do a breast exam (clinical breast exam or CBE) every year. Depending on your age, family history, and medical history, it may be recommended that you also have a yearly breast X-ray (mammogram).  If you have a family history of breast cancer, talk with your health care provider about genetic screening.  If you are at high risk for breast cancer, talk with your health care provider about having an MRI and a mammogram every year.  Breast cancer (BRCA) gene test is recommended for women who have family members with BRCA-related cancers. Results of the assessment will determine the need for genetic counseling and BRCA1 and for BRCA2 testing. BRCA-related cancers include these types:  Breast. This occurs in males or females.  Ovarian.  Tubal. This may also be called fallopian tube cancer.  Cancer of the abdominal or pelvic lining (peritoneal cancer).  Prostate.  Pancreatic. Cervical, Uterine, and Ovarian Cancer Your health care provider may recommend that you be screened regularly for cancer of the pelvic organs. These include your ovaries, uterus, and vagina. This screening involves a pelvic exam, which includes checking for microscopic changes to the surface of your cervix (Pap test).  For women ages 21-65, health care providers may recommend a pelvic exam and a Pap test every three years. For women ages 51-65, they may recommend the Pap test and pelvic exam, combined with testing for human papilloma virus (HPV), every five years. Some types of HPV increase your risk of cervical cancer. Testing for HPV may also be done on women of any age who have unclear Pap test results.  Other health care providers may not recommend any screening for nonpregnant women who are considered low risk for pelvic cancer and have no symptoms. Ask your health care provider if a screening  pelvic exam is right for you.  If you have had past treatment for cervical cancer or a condition that could lead to cancer, you need Pap tests and screening for cancer for at least 20 years after your treatment. If Pap tests have been discontinued for you, your risk factors (such as having a new sexual partner) need to be reassessed to determine if you should start having screenings again. Some women have medical problems that increase the chance of getting cervical cancer. In these cases, your health care provider may recommend that you have screening and Pap tests more often.  If you have a family history of uterine cancer or ovarian cancer, talk with your health care provider about genetic screening.  If you have vaginal bleeding after reaching menopause, tell your health care provider.  There are currently no reliable tests available to screen for ovarian cancer. Lung Cancer Lung cancer screening is recommended for adults 84-41 years old who are at high risk for lung cancer because of a history of smoking. A yearly low-dose CT scan of the lungs is recommended if you:  Currently smoke.  Have a history of at least 30 pack-years of smoking and you currently smoke or have quit within the past 15 years. A pack-year is smoking an average of one pack of cigarettes  per day for one year. Yearly screening should:  Continue until it has been 15 years since you quit.  Stop if you develop a health problem that would prevent you from having lung cancer treatment. Colorectal Cancer  This type of cancer can be detected and can often be prevented.  Routine colorectal cancer screening usually begins at age 66 and continues through age 89.  If you have risk factors for colon cancer, your health care provider may recommend that you be screened at an earlier age.  If you have a family history of colorectal cancer, talk with your health care provider about genetic screening.  Your health care provider  may also recommend using home test kits to check for hidden blood in your stool.  A small camera at the end of a tube can be used to examine your colon directly (sigmoidoscopy or colonoscopy). This is done to check for the earliest forms of colorectal cancer.  Direct examination of the colon should be repeated every 5-10 years until age 39. However, if early forms of precancerous polyps or small growths are found or if you have a family history or genetic risk for colorectal cancer, you may need to be screened more often. Skin Cancer  Check your skin from head to toe regularly.  Monitor any moles. Be sure to tell your health care provider:  About any new moles or changes in moles, especially if there is a change in a mole's shape or color.  If you have a mole that is larger than the size of a pencil eraser.  If any of your family members has a history of skin cancer, especially at a young age, talk with your health care provider about genetic screening.  Always use sunscreen. Apply sunscreen liberally and repeatedly throughout the day.  Whenever you are outside, protect yourself by wearing long sleeves, pants, a wide-brimmed hat, and sunglasses. WHAT SHOULD I KNOW ABOUT OSTEOPOROSIS? Osteoporosis is a condition in which bone destruction happens more quickly than new bone creation. After menopause, you may be at an increased risk for osteoporosis. To help prevent osteoporosis or the bone fractures that can happen because of osteoporosis, the following is recommended:  If you are 37-67 years old, get at least 1,000 mg of calcium and at least 600 mg of vitamin D per day.  If you are older than age 28 but younger than age 42, get at least 1,200 mg of calcium and at least 600 mg of vitamin D per day.  If you are older than age 92, get at least 1,200 mg of calcium and at least 800 mg of vitamin D per day. Smoking and excessive alcohol intake increase the risk of osteoporosis. Eat foods that are  rich in calcium and vitamin D, and do weight-bearing exercises several times each week as directed by your health care provider. WHAT SHOULD I KNOW ABOUT HOW MENOPAUSE AFFECTS [place]? Depression may occur at any age, but it is more common as you become older. Common symptoms of depression include:  Low or sad mood.  Changes in sleep patterns.  Changes in appetite or eating patterns.  Feeling an overall lack of motivation or enjoyment of activities that you previously enjoyed.  Frequent crying spells. Talk with your health care provider if you think that you are experiencing depression. WHAT SHOULD I KNOW ABOUT IMMUNIZATIONS? It is important that you get and maintain your immunizations. These include:  Tetanus, diphtheria, and pertussis (Tdap) booster vaccine.  Influenza  every year before the flu season begins.  Pneumonia vaccine.  Shingles vaccine. Your health care provider may also recommend other immunizations.   This information is not intended to replace advice given to you by your health care provider. Make sure you discuss any questions you have with your health care provider.   Document Released: 07/20/2005 Document Revised: 06/18/2014 Document Reviewed: 01/28/2014 Elsevier Interactive Patient Education [employer].

## 2016-03-07 NOTE — Progress Notes (Signed)
Pre visit review using our clinic review tool, if applicable. No additional management support is needed unless otherwise documented below in the visit note. 

## 2016-03-07 NOTE — Addendum Note (Signed)
Addended by: Jimmye NormanPHANOS, Kerianna Rawlinson J on: 03/07/2016 12:19 PM   Modules accepted: Orders

## 2016-06-07 DIAGNOSIS — Z1231 Encounter for screening mammogram for malignant neoplasm of breast: Secondary | ICD-10-CM | POA: Diagnosis not present

## 2016-06-07 LAB — HM MAMMOGRAPHY

## 2016-06-08 ENCOUNTER — Encounter: Payer: Self-pay | Admitting: Internal Medicine

## 2016-08-21 ENCOUNTER — Other Ambulatory Visit: Payer: Self-pay | Admitting: Internal Medicine

## 2016-10-08 ENCOUNTER — Telehealth: Payer: Self-pay | Admitting: Internal Medicine

## 2016-10-08 NOTE — Telephone Encounter (Signed)
Heather,RN  from [facility] called to report that pt's heat rate is 38/40 with c/o being cold. No other symptoms. Pt stated to nurse that Dr Kirtland Bouchard would consider d/c some of her medications if her HR was low.  Please advise

## 2016-10-08 NOTE — Telephone Encounter (Signed)
Hold metoprolol.needs office visit tomorrow for evaluation

## 2016-10-09 ENCOUNTER — Ambulatory Visit: Payer: Medicare Other | Admitting: Internal Medicine

## 2016-10-09 NOTE — Telephone Encounter (Signed)
Pt scheduled  

## 2016-10-09 NOTE — Telephone Encounter (Signed)
Called pt and left a detailed message to hold metoprolol. Informed her that she needs office visit today for evaluation and to call the office back

## 2016-10-10 ENCOUNTER — Encounter: Payer: Self-pay | Admitting: Internal Medicine

## 2016-10-10 ENCOUNTER — Ambulatory Visit (INDEPENDENT_AMBULATORY_CARE_PROVIDER_SITE_OTHER): Payer: Medicare Other | Admitting: Internal Medicine

## 2016-10-10 VITALS — BP 130/88 | HR 60 | Temp 98.1°F | Ht 62.5 in | Wt 161.0 lb

## 2016-10-10 DIAGNOSIS — R001 Bradycardia, unspecified: Secondary | ICD-10-CM

## 2016-10-10 DIAGNOSIS — I359 Nonrheumatic aortic valve disorder, unspecified: Secondary | ICD-10-CM

## 2016-10-10 DIAGNOSIS — I10 Essential (primary) hypertension: Secondary | ICD-10-CM

## 2016-10-10 NOTE — Patient Instructions (Signed)
Limit your sodium (Salt) intake  Please check your blood pressure on a regular basis.  If it is consistently greater than 150/90, please make an office appointment.  Return in 6 months for follow-up   

## 2016-10-10 NOTE — Progress Notes (Signed)
Pre visit review using our clinic review tool, if applicable. No additional management support is needed unless otherwise documented below in the visit note. 

## 2016-10-10 NOTE — Progress Notes (Signed)
Subjective:    Patient ID: Tanya Gray, female    DOB: April 28, 1946, 71 y.o.   MRN: [id number]  HPI  71 year old patient who was seen by a [facility] visiting home nurse yesterday and noted to have significant bradycardia with a pulse around 40.  The patient complains of "feeling cold" at times, but the no other symptoms.  She has been on triple therapy for blood pressure control She does have a history of mild cardiomegaly and aortic insufficiency  Doing quite well today  Past Medical History:  Diagnosis Date  . AORTIC INSUFFICIENCY 08/03/2009  . Cardiomegaly 01/29/2007  . Diverticulosis of colon (without mention of hemorrhage) 08/08/2006  . ESOPHAGEAL STRICTURE 02/19/2008  . Heartburn 02/19/2008  . HYPERLIPIDEMIA 02/18/2008  . HYPERTENSION, BENIGN SYSTEMIC 08/08/2006  . MYALGIA 01/29/2007  . OSTEOARTHRITIS 12/21/2008  . PEPTIC ULCER DISEASE 12/21/2008  . RHINITIS, ALLERGIC 08/08/2006  . Unspecified asthma(493.90) 08/08/2006     Social History   Social History  . Marital status: Single    Spouse name: N/A  . Number of children: N/A  . Years of education: N/A   Occupational History  . Not on file.   Social History Main Topics  . Smoking status: Former Smoker    Quit date: 06/12/2003  . Smokeless tobacco: Never Used  . Alcohol use 0.0 oz/week    1 - 2 Cans of beer per week  . Drug use: No  . Sexual activity: Not on file   Other Topics Concern  . Not on file   Social History Narrative  . No narrative on file    Past Surgical History:  Procedure Laterality Date  . ABDOMINAL HYSTERECTOMY    . ENDOTRACHEAL INTUBATION EMERGENT    . ESOPHAGOGASTRODUODENOSCOPY      Family History  Problem Relation Age of Onset  . Diabetes Mother   . Hypertension Mother   . Lung disease Mother   . Aneurysm Sister     cerebral  . Colon cancer Neg Hx   . Esophageal cancer Neg Hx   . Rectal cancer Neg Hx   . Stomach cancer Neg Hx     No Known Allergies  Current  Outpatient Prescriptions on File Prior to Visit  Medication Sig Dispense Refill  . aspirin 81 MG tablet Take 81 mg by mouth daily.      . benazepril (LOTENSIN) 40 MG tablet TAKE ONE TABLET BY MOUTH ONCE DAILY IN THE MORNING 90 tablet 3  . calcium-vitamin D (OSCAL) 250-125 MG-UNIT per tablet Take 1 tablet by mouth daily.      . hydrochlorothiazide (HYDRODIURIL) 25 MG tablet TAKE ONE TABLET BY MOUTH ONCE DAILY IN THE MORNING 90 tablet 3  . metoprolol tartrate (LOPRESSOR) 25 MG tablet TAKE ONE TABLET BY MOUTH TWICE DAILY 180 tablet 0  . omeprazole (PRILOSEC) 20 MG capsule TAKE ONE CAPSULE BY MOUTH ONCE DAILY IN THE MORNING 90 capsule 3  . potassium chloride SA (KLOR-CON M20) 20 MEQ tablet Take 1 tablet (20 mEq total) by mouth daily. 90 tablet 3  . pravastatin (PRAVACHOL) 40 MG tablet Take 1 tablet (40 mg total) by mouth daily. 90 tablet 3  . traMADol (ULTRAM) 50 MG tablet Take 1 tablet (50 mg total) by mouth every 6 (six) hours as needed. for pain 90 tablet 5  . verapamil (CALAN) 120 MG tablet Take 1 tablet (120 mg total) by mouth 2 (two) times daily. 180 tablet 3   No current facility-administered medications on file prior to  visit.     BP 130/88 (BP Location: Left Arm, Patient Position: Sitting, Cuff Size: Normal)   Pulse 60   Temp 98.1 F (36.7 C) (Oral)   Ht 5' 2.5" (1.588 m)   Wt 161 lb (73 kg)   SpO2 98%   BMI 28.98 kg/m     Review of Systems  Constitutional: Negative.   HENT: Negative for congestion, dental problem, hearing loss, rhinorrhea, sinus pressure, sore throat and tinnitus.   Eyes: Negative for pain, discharge and visual disturbance.  Respiratory: Negative for cough and shortness of breath.   Cardiovascular: Negative for chest pain, palpitations and leg swelling.  Gastrointestinal: Negative for abdominal distention, abdominal pain, blood in stool, constipation, diarrhea, nausea and vomiting.  Genitourinary: Negative for difficulty urinating, dysuria, flank pain,  frequency, hematuria, pelvic pain, urgency, vaginal bleeding, vaginal discharge and vaginal pain.  Musculoskeletal: Negative for arthralgias, gait problem and joint swelling.  Skin: Negative for rash.  Neurological: Negative for dizziness, syncope, speech difficulty, weakness, numbness and headaches.  Hematological: Negative for adenopathy.  Psychiatric/Behavioral: Negative for agitation, behavioral problems and dysphoric mood. The patient is not nervous/anxious.        Objective:   Physical Exam  Constitutional: She is oriented to person, place, and time. She appears well-developed and well-nourished.  HENT:  Head: Normocephalic.  Right Ear: External ear normal.  Left Ear: External ear normal.  Mouth/Throat: Oropharynx is clear and moist.  Eyes: Conjunctivae and EOM are normal. Pupils are equal, round, and reactive to light.  Neck: Normal range of motion. Neck supple. No thyromegaly present.  Cardiovascular: Normal rate, regular rhythm, normal heart sounds and intact distal pulses.   Rate 60  Pulmonary/Chest: Effort normal and breath sounds normal.  Abdominal: Soft. Bowel sounds are normal. She exhibits no mass. There is no tenderness.  Musculoskeletal: Normal range of motion.  Lymphadenopathy:    She has no cervical adenopathy.  Neurological: She is alert and oriented to person, place, and time.  Skin: Skin is warm and dry. No rash noted.  Psychiatric: She has a normal mood and affect. Her behavior is normal.          Assessment & Plan:   Essential hypertension.  Metoprolol has been discontinued.  We'll continue to monitor on dual therapy Mild AI  CPX October as scheduled Continue home blood pressure monitoring  Lashara Urey Homero Fellers

## 2016-11-16 ENCOUNTER — Other Ambulatory Visit: Payer: Self-pay | Admitting: Internal Medicine

## 2017-02-08 ENCOUNTER — Other Ambulatory Visit: Payer: Self-pay

## 2017-02-08 ENCOUNTER — Other Ambulatory Visit: Payer: Self-pay | Admitting: Internal Medicine

## 2017-02-08 MED ORDER — TRAMADOL HCL 50 MG PO TABS: 50.0000 mg | ORAL_TABLET | Freq: Four times a day (QID) | ORAL | 0 refills | Status: DC | PRN

## 2017-02-08 NOTE — Telephone Encounter (Signed)
[facility]

## 2017-04-17 ENCOUNTER — Ambulatory Visit (INDEPENDENT_AMBULATORY_CARE_PROVIDER_SITE_OTHER): Payer: Medicare Other | Admitting: Internal Medicine

## 2017-04-17 ENCOUNTER — Other Ambulatory Visit: Payer: Self-pay | Admitting: Internal Medicine

## 2017-04-17 ENCOUNTER — Encounter: Payer: Self-pay | Admitting: Internal Medicine

## 2017-04-17 ENCOUNTER — Telehealth: Payer: Self-pay | Admitting: Internal Medicine

## 2017-04-17 VITALS — BP 110/64 | HR 65 | Temp 98.1°F | Ht 62.5 in | Wt 156.2 lb

## 2017-04-17 DIAGNOSIS — I359 Nonrheumatic aortic valve disorder, unspecified: Secondary | ICD-10-CM | POA: Diagnosis not present

## 2017-04-17 DIAGNOSIS — Z23 Encounter for immunization: Secondary | ICD-10-CM | POA: Diagnosis not present

## 2017-04-17 DIAGNOSIS — E785 Hyperlipidemia, unspecified: Secondary | ICD-10-CM

## 2017-04-17 DIAGNOSIS — Z0000 Encounter for general adult medical examination without abnormal findings: Secondary | ICD-10-CM

## 2017-04-17 DIAGNOSIS — I10 Essential (primary) hypertension: Secondary | ICD-10-CM | POA: Diagnosis not present

## 2017-04-17 LAB — COMPREHENSIVE METABOLIC PANEL
ALT: 10 U/L (ref 0–35)
AST: 17 U/L (ref 0–37)
Albumin: 4.2 g/dL (ref 3.5–5.2)
Alkaline Phosphatase: 39 U/L (ref 39–117)
BUN: 13 mg/dL (ref 6–23)
CO2: 33 mEq/L — ABNORMAL HIGH (ref 19–32)
Calcium: 10.1 mg/dL (ref 8.4–10.5)
Chloride: 102 mEq/L (ref 96–112)
Creatinine, Ser: 0.96 mg/dL (ref 0.40–1.20)
GFR: 73.97 mL/min (ref >60.00)
Glucose, Bld: 89 mg/dL (ref 70–99)
Potassium: 3.6 mEq/L (ref 3.5–5.1)
Sodium: 143 mEq/L (ref 135–145)
Total Bilirubin: 0.4 mg/dL (ref 0.2–1.2)
Total Protein: 6.5 g/dL (ref 6.0–8.3)

## 2017-04-17 LAB — CBC WITH DIFFERENTIAL/PLATELET
Basophils Absolute: 0 10*3/uL (ref 0.0–0.1)
Basophils Relative: 0.8 % (ref 0.0–3.0)
Eosinophils Absolute: 0 10*3/uL (ref 0.0–0.7)
Eosinophils Relative: 0.7 % (ref 0.0–5.0)
HCT: 38.2 % (ref 36.0–46.0)
Hemoglobin: 12.3 g/dL (ref 12.0–15.0)
Lymphocytes Relative: 23.6 % (ref 12.0–46.0)
Lymphs Abs: 1 10*3/uL (ref 0.7–4.0)
MCHC: 32.1 g/dL (ref 30.0–36.0)
MCV: 101.7 fl — ABNORMAL HIGH (ref 78.0–100.0)
Monocytes Absolute: 0.3 10*3/uL (ref 0.1–1.0)
Monocytes Relative: 6.9 % (ref 3.0–12.0)
Neutro Abs: 2.8 10*3/uL (ref 1.4–7.7)
Neutrophils Relative %: 68 % (ref 43.0–77.0)
Platelets: 236 10*3/uL (ref 150.0–400.0)
RBC: 3.76 Mil/uL — ABNORMAL LOW (ref 3.87–5.11)
RDW: 13.8 % (ref 11.5–15.5)
WBC: 4.2 10*3/uL (ref 4.0–10.5)

## 2017-04-17 LAB — LIPID PANEL
Cholesterol: 199 mg/dL (ref 0–200)
HDL: 74.7 mg/dL (ref >39.00)
LDL Cholesterol: 109 mg/dL — ABNORMAL HIGH (ref 0–99)
NonHDL: 124.69
Total CHOL/HDL Ratio: 3
Triglycerides: 77 mg/dL (ref 0.0–149.0)
VLDL: 15.4 mg/dL (ref 0.0–40.0)

## 2017-04-17 LAB — TSH: TSH: 1.15 u[IU]/mL (ref 0.35–4.50)

## 2017-04-17 MED ORDER — PRAVASTATIN SODIUM 40 MG PO TABS: 40.0000 mg | ORAL_TABLET | Freq: Every day | ORAL | 3 refills | Status: DC

## 2017-04-17 MED ORDER — OMEPRAZOLE 20 MG PO CPDR: DELAYED_RELEASE_CAPSULE | ORAL | 3 refills | Status: DC

## 2017-04-17 MED ORDER — TRAMADOL HCL 50 MG PO TABS: 50.0000 mg | ORAL_TABLET | Freq: Four times a day (QID) | ORAL | 0 refills | Status: DC | PRN

## 2017-04-17 MED ORDER — VERAPAMIL HCL 120 MG PO TABS: 120.0000 mg | ORAL_TABLET | Freq: Two times a day (BID) | ORAL | 3 refills | Status: DC

## 2017-04-17 MED ORDER — POTASSIUM CHLORIDE CRYS ER 20 MEQ PO TBCR: 20.0000 meq | EXTENDED_RELEASE_TABLET | Freq: Every day | ORAL | 3 refills | Status: DC

## 2017-04-17 MED ORDER — HYDROCHLOROTHIAZIDE 25 MG PO TABS: ORAL_TABLET | ORAL | 3 refills | Status: DC

## 2017-04-17 NOTE — Telephone Encounter (Signed)
Patient was just seen in the office and forgot to ask for Rx refills on all her medications. She needs them sent to:  [facility] [address] [place], [place] - [id number] [phone] (Phone) [phone] (Fax)

## 2017-04-17 NOTE — Progress Notes (Signed)
Subjective:    Patient ID: Tanya Gray, female    DOB: 1945/09/21, 71 y.o.   MRN: [id number]  HPI 71 year old patient who is seen today for her six-month follow-up. She has a history of hypertension.  Metoprolol was tapered and discontinued 6 months ago due to bradycardia.  She remains on benazepril and verapamil.  She is doing well without concerns or complaints.  Blood pressure well controlled today No recent lab denies any cardiopulmonary complaints  Does have a history of mild aortic insufficiency  Past Medical History:  Diagnosis Date  . AORTIC INSUFFICIENCY 08/03/2009  . Cardiomegaly 01/29/2007  . Diverticulosis of colon (without mention of hemorrhage) 08/08/2006  . ESOPHAGEAL STRICTURE 02/19/2008  . Heartburn 02/19/2008  . HYPERLIPIDEMIA 02/18/2008  . HYPERTENSION, BENIGN SYSTEMIC 08/08/2006  . MYALGIA 01/29/2007  . OSTEOARTHRITIS 12/21/2008  . PEPTIC ULCER DISEASE 12/21/2008  . RHINITIS, ALLERGIC 08/08/2006  . Unspecified asthma(493.90) 08/08/2006     Social History   Socioeconomic History  . Marital status: Single    Spouse name: Not on file  . Number of children: Not on file  . Years of education: Not on file  . Highest education level: Not on file  Social Needs  . Financial resource strain: Not on file  . Food insecurity - worry: Not on file  . Food insecurity - inability: Not on file  . Transportation needs - medical: Not on file  . Transportation needs - non-medical: Not on file  Occupational History  . Not on file  Tobacco Use  . Smoking status: Former Smoker    Last attempt to quit: 06/12/2003    Years since quitting: 13.8  . Smokeless tobacco: Never Used  Substance and Sexual Activity  . Alcohol use: Yes    Alcohol/week: 0.0 oz    Types: 1 - 2 Cans of beer per week  . Drug use: No  . Sexual activity: Not on file  Other Topics Concern  . Not on file  Social History Narrative  . Not on file    Past Surgical History:  Procedure Laterality Date  .  ABDOMINAL HYSTERECTOMY    . ENDOTRACHEAL INTUBATION EMERGENT    . ESOPHAGOGASTRODUODENOSCOPY      Family History  Problem Relation Age of Onset  . Diabetes Mother   . Hypertension Mother   . Lung disease Mother   . Aneurysm Sister        cerebral  . Colon cancer Neg Hx   . Esophageal cancer Neg Hx   . Rectal cancer Neg Hx   . Stomach cancer Neg Hx     No Known Allergies  Current Outpatient Medications on File Prior to Visit  Medication Sig Dispense Refill  . aspirin 81 MG tablet Take 81 mg by mouth daily.      . benazepril (LOTENSIN) 40 MG tablet TAKE ONE TABLET BY MOUTH ONCE DAILY IN THE MORNING 90 tablet 3  . calcium-vitamin D (OSCAL) 250-125 MG-UNIT per tablet Take 1 tablet by mouth daily.      . hydrochlorothiazide (HYDRODIURIL) 25 MG tablet TAKE ONE TABLET BY MOUTH ONCE DAILY IN THE MORNING 90 tablet 3  . omeprazole (PRILOSEC) 20 MG capsule TAKE ONE CAPSULE BY MOUTH ONCE DAILY IN THE MORNING 90 capsule 3  . potassium chloride SA (KLOR-CON M20) 20 MEQ tablet Take 1 tablet (20 mEq total) by mouth daily. 90 tablet 3  . pravastatin (PRAVACHOL) 40 MG tablet Take 1 tablet (40 mg total) by mouth daily. 90 tablet  3  . traMADol (ULTRAM) 50 MG tablet Take 1 tablet (50 mg total) by mouth every 6 (six) hours as needed. for pain 90 tablet 0  . verapamil (CALAN) 120 MG tablet Take 1 tablet (120 mg total) by mouth 2 (two) times daily. 180 tablet 3   No current facility-administered medications on file prior to visit.     BP 110/64 (BP Location: Left Arm, Patient Position: Sitting, Cuff Size: Normal)   Pulse 65   Temp 98.1 F (36.7 C) (Oral)   Ht 5' 2.5" (1.588 m)   Wt 156 lb 3.2 oz (70.9 kg)   SpO2 96%   BMI 28.11 kg/m      Review of Systems  Constitutional: Negative.   HENT: Negative for congestion, dental problem, hearing loss, rhinorrhea, sinus pressure, sore throat and tinnitus.   Eyes: Negative for pain, discharge and visual disturbance.  Respiratory: Negative for cough  and shortness of breath.   Cardiovascular: Negative for chest pain, palpitations and leg swelling.  Gastrointestinal: Negative for abdominal distention, abdominal pain, blood in stool, constipation, diarrhea, nausea and vomiting.  Genitourinary: Negative for difficulty urinating, dysuria, flank pain, frequency, hematuria, pelvic pain, urgency, vaginal bleeding, vaginal discharge and vaginal pain.  Musculoskeletal: Negative for arthralgias, gait problem and joint swelling.  Skin: Negative for rash.  Neurological: Negative for dizziness, syncope, speech difficulty, weakness, numbness and headaches.  Hematological: Negative for adenopathy.  Psychiatric/Behavioral: Negative for agitation, behavioral problems and dysphoric mood. The patient is not nervous/anxious.        Objective:   Physical Exam  Constitutional: She is oriented to person, place, and time. She appears well-developed and well-nourished.  Blood pressure 118/70  HENT:  Head: Normocephalic and atraumatic.  Right Ear: External ear normal.  Left Ear: External ear normal.  Mouth/Throat: Oropharynx is clear and moist.  Eyes: Conjunctivae and EOM are normal.  Neck: Normal range of motion. Neck supple. No JVD present. No thyromegaly present.  Cardiovascular: Normal rate, regular rhythm, normal heart sounds and intact distal pulses.  No murmur heard. Grade 1/6 systolic murmur.  No murmur of AI appreciated  Pulmonary/Chest: Effort normal and breath sounds normal. She has no wheezes. She has no rales.  Abdominal: Soft. Bowel sounds are normal. She exhibits no distension and no mass. There is no tenderness. There is no rebound and no guarding.  Musculoskeletal: Normal range of motion. She exhibits no edema or tenderness.  Neurological: She is alert and oriented to person, place, and time. She has normal reflexes. No cranial nerve deficit. She exhibits normal muscle tone. Coordination normal.  Skin: Skin is warm and dry. No rash noted.    Psychiatric: She has a normal mood and affect. Her behavior is normal.          Assessment & Plan:   Essential hypertension.  Well controlled.  Will check screening lab Dyslipidemia.  Will review a lipid profile History of colonic polyps. Osteoarthritis stable  Flu vaccine administered Check screening lab CPX 1 year  Rogelia BogaKWIATKOWSKI,Aul Mangieri FRANK

## 2017-04-17 NOTE — Patient Instructions (Signed)
Limit your sodium (Salt) intake  Please check your blood pressure on a regular basis.  If it is consistently greater than 150/90, please make an office appointment.    It is important that you exercise regularly, at least 20 minutes 3 to 4 times per week.  If you develop chest pain or shortness of breath seek  medical attention.  Return in one year for follow-up   Schedule your mammogram yearly

## 2017-04-18 ENCOUNTER — Other Ambulatory Visit: Payer: Self-pay | Admitting: Internal Medicine

## 2017-04-18 LAB — HEPATITIS C ANTIBODY
Hepatitis C Ab: NONREACTIVE
SIGNAL TO CUT-OFF: 0.06 (ref <1.00)

## 2017-04-18 NOTE — Telephone Encounter (Signed)
Medication refills were sent to the correct pharmacy.

## 2017-05-15 ENCOUNTER — Telehealth: Payer: Self-pay | Admitting: Internal Medicine

## 2017-05-15 ENCOUNTER — Other Ambulatory Visit: Payer: Self-pay | Admitting: Internal Medicine

## 2017-05-15 NOTE — Telephone Encounter (Signed)
Copied from CRM [id number]. Topic: Quick Communication - See Telephone Encounter >> May 15, 2017  2:56 PM Cipriano BunkerLambe, Annette S wrote: CRM for notification. See Telephone encounter for:  Pt. Is asking for Prescription refills; Benazepril, Verapamil, Hydrocholrothiazide, pravastatin Sodium, Potassium Chloride, Omeprazole & Tramadol HCI 05/15/17.

## 2017-05-17 NOTE — Telephone Encounter (Signed)
Rx should have refills- and others have been refills in December- attempted to call patient and number is not in service

## 2017-06-10 DIAGNOSIS — Z1231 Encounter for screening mammogram for malignant neoplasm of breast: Secondary | ICD-10-CM | POA: Diagnosis not present

## 2017-06-10 LAB — HM MAMMOGRAPHY

## 2017-06-21 ENCOUNTER — Encounter: Payer: Self-pay | Admitting: Internal Medicine

## 2017-08-12 ENCOUNTER — Telehealth: Payer: Self-pay | Admitting: Internal Medicine
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
## 2017-08-13 NOTE — Telephone Encounter (Signed)
Left message for patient to call back to verify that all medications listed are needed now.

## 2017-08-15 ENCOUNTER — Other Ambulatory Visit: Payer: Self-pay

## 2017-08-15 MED ORDER — TRAMADOL HCL 50 MG PO TABS: 50.0000 mg | ORAL_TABLET | Freq: Four times a day (QID) | ORAL | 0 refills | Status: DC | PRN
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
## 2017-08-15 NOTE — Telephone Encounter (Signed)
Get approval for tramadol refill. All other meds are non-controlled and we can just call the pharmacy and ask them to refill one of the remaining refills.

## 2017-08-16 NOTE — Telephone Encounter (Signed)
Rx/forms faxed. Fax confirmation received. Pharmacy is able to fill patient's other medications, issue is that insurance will not cover an early refill. Patient was encouraged to contact her insurance company and explain the situation to see if they will cover the medication refills.

## 2017-09-08 ENCOUNTER — Other Ambulatory Visit: Payer: Self-pay | Admitting: Internal Medicine

## 2017-09-10 ENCOUNTER — Other Ambulatory Visit: Payer: Self-pay

## 2017-09-10 MED ORDER — TRAMADOL HCL 50 MG PO TABS: 50.0000 mg | ORAL_TABLET | Freq: Four times a day (QID) | ORAL | 0 refills | Status: DC | PRN

## 2017-11-07 ENCOUNTER — Other Ambulatory Visit: Payer: Self-pay | Admitting: Family Medicine

## 2017-11-07 ENCOUNTER — Encounter: Payer: Self-pay | Admitting: Family Medicine

## 2017-11-07 ENCOUNTER — Ambulatory Visit (INDEPENDENT_AMBULATORY_CARE_PROVIDER_SITE_OTHER): Payer: Medicare Other | Admitting: Family Medicine

## 2017-11-07 VITALS — BP 100/62 | HR 70 | Temp 99.0°F | Wt 144.1 lb

## 2017-11-07 DIAGNOSIS — I10 Essential (primary) hypertension: Secondary | ICD-10-CM

## 2017-11-07 DIAGNOSIS — E785 Hyperlipidemia, unspecified: Secondary | ICD-10-CM | POA: Diagnosis not present

## 2017-11-07 NOTE — Telephone Encounter (Signed)
Copied from CRM [id number]. Topic: Quick Communication - See Telephone Encounter >> Nov 07, 2017 12:32 PM Tanya Gray wrote: Pt is needing a refill on her tramadol  Best number [phone]  [facility] [phone]

## 2017-11-07 NOTE — Progress Notes (Signed)
Subjective:    Patient ID: Tanya Gray, female    DOB: 1945-11-12, 72 y.o.   MRN: [id number]  Chief Complaint  Patient presents with  . Establish Care    HPI Patient was seen today for transfer of care.  Pt was previously seen by Dr. Kirtland Bouchard.  Pt states she is doing well overall.  She is not currently checking her blood pressure but states she takes benazepril, hydrochlorothiazide 25 mg, verapamil 120 mg bid.  Pt states she is also taking pravastatin 40 mg daily.  She denies myalgias.  Patient states she is retired.  She worked at [employer] in housekeeping.  Past Medical History:  Diagnosis Date  . AORTIC INSUFFICIENCY 08/03/2009  . Cardiomegaly 01/29/2007  . Diverticulosis of colon (without mention of hemorrhage) 08/08/2006  . ESOPHAGEAL STRICTURE 02/19/2008  . Heartburn 02/19/2008  . HYPERLIPIDEMIA 02/18/2008  . HYPERTENSION, BENIGN SYSTEMIC 08/08/2006  . MYALGIA 01/29/2007  . OSTEOARTHRITIS 12/21/2008  . PEPTIC ULCER DISEASE 12/21/2008  . RHINITIS, ALLERGIC 08/08/2006  . Unspecified asthma(493.90) 08/08/2006    No Known Allergies  ROS General: Denies fever, chills, night sweats, changes in weight, changes in appetite HEENT: Denies headaches, ear pain, changes in vision, rhinorrhea, sore throat CV: Denies CP, palpitations, SOB, orthopnea Pulm: Denies SOB, cough, wheezing GI: Denies abdominal pain, nausea, vomiting, diarrhea, constipation GU: Denies dysuria, hematuria, frequency, vaginal discharge Msk: Denies muscle cramps, joint pains Neuro: Denies weakness, numbness, tingling Skin: Denies rashes, bruising Psych: Denies depression, anxiety, hallucinations     Objective:    Blood pressure 100/62, pulse 70, temperature 99 F (37.2 C), temperature source Oral, weight 144 lb 1.6 oz (65.4 kg), SpO2 96 %.   Gen. Pleasant, well-nourished, in no distress, normal affect  HEENT: [place]/AT, face symmetric, no scleral icterus, PERRLA, nares patent without drainage, upper dentures in place,  pharynx without erythema or exudate. Neck: No JVD, no thyromegaly, no carotid bruits Lungs: no accessory muscle use, CTAB, no wheezes or rales Cardiovascular: RRR, 2/6 murmur best appreciated at L sternal border.  no peripheral edema Abdomen: BS present, soft, NT/ND Neuro:  A&Ox3, CN II-XII intact, normal gait Skin:  Warm, no lesions/ rash   Wt Readings from Last 3 Encounters:  11/07/17 144 lb 1.6 oz (65.4 kg)  04/17/17 156 lb 3.2 oz (70.9 kg)  10/10/16 161 lb (73 kg)    Lab Results  Component Value Date   WBC 4.2 04/17/2017   HGB 12.3 04/17/2017   HCT 38.2 04/17/2017   PLT 236.0 04/17/2017   GLUCOSE 89 04/17/2017   CHOL 199 04/17/2017   TRIG 77.0 04/17/2017   HDL 74.70 04/17/2017   LDLCALC 109 (H) 04/17/2017   ALT 10 04/17/2017   AST 17 04/17/2017   NA 143 04/17/2017   K 3.6 04/17/2017   CL 102 04/17/2017   CREATININE 0.96 04/17/2017   BUN 13 04/17/2017   CO2 33 (H) 04/17/2017   TSH 1.15 04/17/2017   INR 1.0 01/14/2008   HGBA1C 5.8 07/21/2008    Assessment/Plan:  Essential hypertension -Controlled -Continue current medications including benazepril, hydrochlorothiazide, verapamil, potassium chloride. -Patient encouraged to obtain a blood pressure cuff and check BP at home. -Lifestyle modifications encouraged -Given handout  Hyperlipidemia, unspecified hyperlipidemia type -Continue pravastatin 40 mg daily -Patient encouraged to start lifestyle modifications to decrease her intake of greasy foods  Follow-up in the next few months PRN  Abbe Amsterdam, MD

## 2017-11-07 NOTE — Patient Instructions (Signed)
Managing Your Hypertension Hypertension is commonly called high blood pressure. This is when the force of your blood pressing against the walls of your arteries is too strong. Arteries are blood vessels that carry blood from your heart throughout your body. Hypertension forces the heart to work harder to pump blood, and may cause the arteries to become narrow or stiff. Having untreated or uncontrolled hypertension can cause heart attack, stroke, kidney disease, and other problems. What are blood pressure readings? A blood pressure reading consists of a higher number over a lower number. Ideally, your blood pressure should be below 120/80. The first ("top") number is called the systolic pressure. It is a measure of the pressure in your arteries as your heart beats. The second ("bottom") number is called the diastolic pressure. It is a measure of the pressure in your arteries as the heart relaxes. What does my blood pressure reading mean? Blood pressure is classified into four stages. Based on your blood pressure reading, your health care provider may use the following stages to determine what type of treatment you need, if any. Systolic pressure and diastolic pressure are measured in a unit called mm Hg. Normal  Systolic pressure: below 120.  Diastolic pressure: below 80. Elevated  Systolic pressure: 120-129.  Diastolic pressure: below 80. Hypertension stage 1  Systolic pressure: 130-139.  Diastolic pressure: 80-89. Hypertension stage 2  Systolic pressure: 140 or above.  Diastolic pressure: 90 or above. What health risks are associated with hypertension? Managing your hypertension is an important responsibility. Uncontrolled hypertension can lead to:  A heart attack.  A stroke.  A weakened blood vessel (aneurysm).  Heart failure.  Kidney damage.  Eye damage.  Metabolic syndrome.  Memory and concentration problems.  What changes can I make to manage my  hypertension? Hypertension can be managed by making lifestyle changes and possibly by taking medicines. Your health care provider will help you make a plan to bring your blood pressure within a normal range. Eating and drinking  Eat a diet that is high in fiber and potassium, and low in salt (sodium), added sugar, and fat. An example eating plan is called the DASH (Dietary Approaches to Stop Hypertension) diet. To eat this way: ? Eat plenty of fresh fruits and vegetables. Try to fill half of your plate at each meal with fruits and vegetables. ? Eat whole grains, such as whole wheat pasta, brown rice, or whole grain bread. Fill about one quarter of your plate with whole grains. ? Eat low-fat diary products. ? Avoid fatty cuts of meat, processed or cured meats, and poultry with skin. Fill about one quarter of your plate with lean proteins such as fish, chicken without skin, beans, eggs, and tofu. ? Avoid premade and processed foods. These tend to be higher in sodium, added sugar, and fat.  Reduce your daily sodium intake. Most people with hypertension should eat less than 1,500 mg of sodium a day.  Limit alcohol intake to no more than 1 drink a day for nonpregnant women and 2 drinks a day for men. One drink equals 12 oz of beer, 5 oz of wine, or 1 oz of hard liquor. Lifestyle  Work with your health care provider to maintain a healthy body weight, or to lose weight. Ask what an ideal weight is for you.  Get at least 30 minutes of exercise that causes your heart to beat faster (aerobic exercise) most days of the week. Activities may include walking, swimming, or biking.  Include exercise   to strengthen your muscles (resistance exercise), such as weight lifting, as part of your weekly exercise routine. Try to do these types of exercises for 30 minutes at least 3 days a week.  Do not use any products that contain nicotine or tobacco, such as cigarettes and e-cigarettes. If you need help quitting, ask  your health care provider.  Control any long-term (chronic) conditions you have, such as high cholesterol or diabetes. Monitoring  Monitor your blood pressure at home as told by your health care provider. Your personal target blood pressure may vary depending on your medical conditions, your age, and other factors.  Have your blood pressure checked regularly, as often as told by your health care provider. Working with your health care provider  Review all the medicines you take with your health care provider because there may be side effects or interactions.  Talk with your health care provider about your diet, exercise habits, and other lifestyle factors that may be contributing to hypertension.  Visit your health care provider regularly. Your health care provider can help you create and adjust your plan for managing hypertension. Will I need medicine to control my blood pressure? Your health care provider may prescribe medicine if lifestyle changes are not enough to get your blood pressure under control, and if:  Your systolic blood pressure is 130 or higher.  Your diastolic blood pressure is 80 or higher.  Take medicines only as told by your health care provider. Follow the directions carefully. Blood pressure medicines must be taken as prescribed. The medicine does not work as well when you skip doses. Skipping doses also puts you at risk for problems. Contact a health care provider if:  You think you are having a reaction to medicines you have taken.  You have repeated (recurrent) headaches.  You feel dizzy.  You have swelling in your ankles.  You have trouble with your vision. Get help right away if:  You develop a severe headache or confusion.  You have unusual weakness or numbness, or you feel faint.  You have severe pain in your chest or abdomen.  You vomit repeatedly.  You have trouble breathing. Summary  Hypertension is when the force of blood pumping through  your arteries is too strong. If this condition is not controlled, it may put you at risk for serious complications.  Your personal target blood pressure may vary depending on your medical conditions, your age, and other factors. For most people, a normal blood pressure is less than 120/80.  Hypertension is managed by lifestyle changes, medicines, or both. Lifestyle changes include weight loss, eating a healthy, low-sodium diet, exercising more, and limiting alcohol. This information is not intended to replace advice given to you by your health care provider. Make sure you discuss any questions you have with your health care provider. Document Released: 02/20/2012 Document Revised: 04/25/2016 Document Reviewed: 04/25/2016 Elsevier Interactive Patient Education  2018 Elsevier Inc.  Heart Disease Prevention Heart disease is a leading cause of death. There are many things you can do to help prevent heart disease. Be physically active Physical activity is good for your heart. It helps control your blood pressure, cholesterol levels, and weight. Try to be physically active every day. Ask your health care provider what activities are best for you. Be a healthy weight Extra weight can strain your heart and affect your blood pressure and cholesterol levels. Lose weight with diet and exercise if recommended by your health care provider. Eat heart-healthy foods Follow a  healthy eating plan as recommended by your health care provider or dietitian. Heart-healthy foods include:  High-fiber foods. These include oat bran, oatmeal, and whole-grain breads and cereals.  Fruits and vegetables.  Avoid:  Alcohol.  Fried foods.  Foods high in saturated fat. These include meats, butter, whole dairy products, shortening, and coconut or palm oil.  Salty foods. These include canned food, luncheon meat, salty snacks, and fast food.  Keep your cholesterol levels under control Cholesterol is a substance that is  used for many important functions. When your cholesterol levels are high, cholesterol can stick to the insides of your blood vessels, making them narrow or clog. This can lead to chest pain (angina) and a heart attack. Keep your cholesterol levels under control as recommended by your health care provider. Have your cholesterol checked at least once a year. Target cholesterol levels (in mg/dL) for most people are:  Total cholesterol below 200.  LDL cholesterol below 100.  HDL cholesterol above 40 in men and above 50 in women.  Triglycerides below 150.  Keep your blood pressure under control Having high blood pressure (hypertension) puts you at risk for stroke and other forms of heart disease. Keep your blood pressure under control as recommended by your health care provider. Ask your health care provider if you need treatment to lower your blood pressure. If you are 61-72 years of age, have your blood pressure checked every 3-5 years. If you are 5 years of age or older, have your blood pressure checked every year. Do not use tobacco products Tobacco smoke can damage your heart and blood vessels. Do not use any tobacco products including cigarettes, chewing tobacco, or electronic cigarettes. If you need help quitting, ask your health care provider. Take medicines as directed Take medicines only as directed by your health care provider. Ask your health care provider whether you should take an aspirin every day. Taking aspirin can help reduce your risk of heart disease and stroke. Where to find more information: To find out more about heart disease, visit the American Heart Association's website at www.americanheart.org This information is not intended to replace advice given to you by your health care provider. Make sure you discuss any questions you have with your health care provider. Document Released: 01/10/2004 Document Revised: 10/26/2015 Document Reviewed: 07/22/2013 Elsevier Interactive  Patient Education  [employer].

## 2017-11-08 NOTE — Telephone Encounter (Signed)
LOV  11/07/17 Dr. Salomon Fick Last refill  09/10/17 # [phone] refill

## 2017-11-13 MED ORDER — TRAMADOL HCL 50 MG PO TABS: 50.0000 mg | ORAL_TABLET | Freq: Four times a day (QID) | ORAL | 0 refills | Status: DC | PRN

## 2017-11-14 ENCOUNTER — Other Ambulatory Visit: Payer: Self-pay | Admitting: Family Medicine

## 2017-11-14 MED ORDER — TRAMADOL HCL 50 MG PO TABS: 50.0000 mg | ORAL_TABLET | Freq: Four times a day (QID) | ORAL | 0 refills | Status: DC | PRN

## 2017-11-14 NOTE — Telephone Encounter (Signed)
Rx was set to "print" and was not sent to pharmacy.  Dr. Salomon FickBanks - [facility] Thanks!

## 2017-11-15 ENCOUNTER — Telehealth: Payer: Self-pay

## 2017-11-15 NOTE — Telephone Encounter (Signed)
Pt Rx for Tramadol was faxed to the pharmacy

## 2017-12-31 NOTE — Telephone Encounter (Signed)
Pt aware that Rx for Tramadol was called in to pharmacy

## 2018-01-02 ENCOUNTER — Other Ambulatory Visit: Payer: Self-pay | Admitting: Family Medicine

## 2018-01-03 NOTE — Telephone Encounter (Signed)
Please Advise

## 2018-02-13 ENCOUNTER — Ambulatory Visit (INDEPENDENT_AMBULATORY_CARE_PROVIDER_SITE_OTHER): Payer: Medicare Other | Admitting: Family Medicine

## 2018-02-13 ENCOUNTER — Encounter: Payer: Self-pay | Admitting: Family Medicine

## 2018-02-13 VITALS — BP 110/68 | HR 68 | Temp 98.8°F | Wt 138.0 lb

## 2018-02-13 DIAGNOSIS — M159 Polyosteoarthritis, unspecified: Secondary | ICD-10-CM | POA: Diagnosis not present

## 2018-02-13 MED ORDER — TRAMADOL HCL 50 MG PO TABS: 50.0000 mg | ORAL_TABLET | Freq: Four times a day (QID) | ORAL | 1 refills | Status: DC | PRN

## 2018-02-13 NOTE — Patient Instructions (Signed)

## 2018-02-13 NOTE — Progress Notes (Signed)
Subjective:    Patient ID: Tanya Gray, female    DOB: 09-15-1945, 72 y.o.   MRN: [id number]  No chief complaint on file.   HPI Patient was seen today for f/u. Overall pt stats she is doing well. Pt states she needed a refill on Tramadol.  Taking Tylenol daily for osteoarthritis pain in knees/ back.  Using Tramadol sparingly, will only take if pt continues despite Tylenol use.    Pt thinks she has refills remaining on her other meds.    Past Medical History:  Diagnosis Date  . AORTIC INSUFFICIENCY 08/03/2009  . Cardiomegaly 01/29/2007  . Diverticulosis of colon (without mention of hemorrhage) 08/08/2006  . ESOPHAGEAL STRICTURE 02/19/2008  . Heartburn 02/19/2008  . HYPERLIPIDEMIA 02/18/2008  . HYPERTENSION, BENIGN SYSTEMIC 08/08/2006  . MYALGIA 01/29/2007  . OSTEOARTHRITIS 12/21/2008  . PEPTIC ULCER DISEASE 12/21/2008  . RHINITIS, ALLERGIC 08/08/2006  . Unspecified asthma(493.90) 08/08/2006    No Known Allergies  ROS  General: Denies fever, chills, night sweats, changes in weight, changes in appetite HEENT: Denies headaches, ear pain, changes in vision, rhinorrhea, sore throat CV: Denies CP, palpitations, SOB, orthopnea Pulm: Denies SOB, cough, wheezing GI: Denies abdominal pain, nausea, vomiting, diarrhea, constipation GU: Denies dysuria, hematuria, frequency, vaginal discharge Msk: Denies muscle cramps  + joint pains Neuro: Denies weakness, numbness, tingling Skin: Denies rashes, bruising Psych: Denies depression, anxiety, hallucinations    Objective:    Blood pressure 110/68, pulse 68, temperature 98.8 F (37.1 C), temperature source Oral, weight 138 lb (62.6 kg), SpO2 98 %.   Gen. Pleasant, well-nourished, in no distress, normal affect   HEENT: [place]/AT, face symmetric,no scleral icterus, PERRLA, nares patent without drainage Lungs: no accessory muscle use, CTAB, no wheezes or rales Cardiovascular: RRR, no m/r/g, no peripheral edema Neuro:  A&Ox3, CN II-XII intact, normal  gait  Wt Readings from Last 3 Encounters:  02/13/18 138 lb (62.6 kg)  11/07/17 144 lb 1.6 oz (65.4 kg)  04/17/17 156 lb 3.2 oz (70.9 kg)    Lab Results  Component Value Date   WBC 4.2 04/17/2017   HGB 12.3 04/17/2017   HCT 38.2 04/17/2017   PLT 236.0 04/17/2017   GLUCOSE 89 04/17/2017   CHOL 199 04/17/2017   TRIG 77.0 04/17/2017   HDL 74.70 04/17/2017   LDLCALC 109 (H) 04/17/2017   ALT 10 04/17/2017   AST 17 04/17/2017   NA 143 04/17/2017   K 3.6 04/17/2017   CL 102 04/17/2017   CREATININE 0.96 04/17/2017   BUN 13 04/17/2017   CO2 33 (H) 04/17/2017   TSH 1.15 04/17/2017   INR 1.0 01/14/2008   HGBA1C 5.8 07/21/2008    Assessment/Plan:  Osteoarthritis of multiple joints, unspecified osteoarthritis type  -Continue using Tylenol prn for joint pain -consider water aerobics -given handout - Plan: traMADol (ULTRAM) 50 MG tablet  F/u prn  Abbe Amsterdam, MD

## 2018-05-01 ENCOUNTER — Other Ambulatory Visit: Payer: Self-pay | Admitting: Family Medicine

## 2018-05-01 DIAGNOSIS — M159 Polyosteoarthritis, unspecified: Secondary | ICD-10-CM

## 2018-05-02 NOTE — Telephone Encounter (Signed)
Pt last OV was 02/23/2018 and last refill was done on 02/23/2018 for 90 tablets with 1 refill. Please Advise if ok to refill

## 2018-06-12 DIAGNOSIS — Z1231 Encounter for screening mammogram for malignant neoplasm of breast: Secondary | ICD-10-CM | POA: Diagnosis not present

## 2018-06-12 LAB — HM MAMMOGRAPHY

## 2018-06-19 ENCOUNTER — Other Ambulatory Visit: Payer: Self-pay | Admitting: Family Medicine

## 2018-06-19 ENCOUNTER — Other Ambulatory Visit: Payer: Self-pay | Admitting: Internal Medicine

## 2018-06-19 DIAGNOSIS — M159 Polyosteoarthritis, unspecified: Secondary | ICD-10-CM

## 2018-06-24 NOTE — Telephone Encounter (Signed)
Pt LOV was 02/13/2018 and last refill was 06/01/2018 for 90 tablets and 1 refill, please advise

## 2018-07-12 ENCOUNTER — Other Ambulatory Visit: Payer: Self-pay | Admitting: Internal Medicine

## 2018-07-18 NOTE — Telephone Encounter (Signed)
Pt called to check status. Refill request should have been sent to Dr Salomon Fick. Please advise.

## 2018-07-21 ENCOUNTER — Other Ambulatory Visit: Payer: Self-pay

## 2018-07-21 MED ORDER — HYDROCHLOROTHIAZIDE 25 MG PO TABS: ORAL_TABLET | ORAL | 1 refills | Status: DC

## 2018-07-21 NOTE — Telephone Encounter (Signed)
Pt following up on refill request. Pt has not received anything, and she now see Dr Salomon Fick. Dr Salomon Fick has never filled these for the pt.   Pt has been trying to refill this since 07/12/2018   [facility] [address] [place] - [address] [phone] (Phone) [phone] (Fax)

## 2018-08-21 ENCOUNTER — Other Ambulatory Visit: Payer: Self-pay | Admitting: Family Medicine

## 2018-08-21 DIAGNOSIS — M159 Polyosteoarthritis, unspecified: Secondary | ICD-10-CM

## 2018-08-22 NOTE — Telephone Encounter (Signed)
Pt LOV was on 02/13/2018 and last refill was done on 06/30/18 for 90 tablets,please advise

## 2018-10-13 ENCOUNTER — Other Ambulatory Visit: Payer: Self-pay

## 2018-10-13 ENCOUNTER — Telehealth: Payer: Self-pay | Admitting: Family Medicine

## 2018-10-13 MED ORDER — HYDROCHLOROTHIAZIDE 25 MG PO TABS: ORAL_TABLET | ORAL | 1 refills | Status: DC

## 2018-10-13 MED ORDER — POTASSIUM CHLORIDE CRYS ER 20 MEQ PO TBCR: 20.0000 meq | EXTENDED_RELEASE_TABLET | Freq: Every day | ORAL | 0 refills | Status: DC

## 2018-10-13 MED ORDER — VERAPAMIL HCL 120 MG PO TABS: 120.0000 mg | ORAL_TABLET | Freq: Two times a day (BID) | ORAL | 0 refills | Status: DC

## 2018-10-13 MED ORDER — PRAVASTATIN SODIUM 40 MG PO TABS: 40.0000 mg | ORAL_TABLET | Freq: Every day | ORAL | 0 refills | Status: DC

## 2018-10-13 MED ORDER — OMEPRAZOLE 20 MG PO CPDR: DELAYED_RELEASE_CAPSULE | ORAL | 0 refills | Status: DC

## 2018-10-13 MED ORDER — BENAZEPRIL HCL 40 MG PO TABS: ORAL_TABLET | ORAL | 0 refills | Status: DC

## 2018-10-13 NOTE — Telephone Encounter (Signed)
Copied from CRM [id number]. Topic: Quick Communication - Rx Refill/Question >> Oct 13, 2018 10:29 AM Herby Abraham C wrote: Medication: --- 90 day supply   benazepril (LOTENSIN) 40 MG tablet omeprazole (PRILOSEC) 20 MG capsule  potassium chloride SA (K-DUR,KLOR-CON) 20 MEQ tablet pravastatin (PRAVACHOL) 40 MG tablet  verapamil (CALAN) 120 MG tablet hydrochlorothiazide (HYDRODIURIL) 25 MG tablet   Has the patient contacted their pharmacy? No  (Agent: If no, request that the patient contact the pharmacy for the refill.) (Agent: If yes, when and what did the pharmacy advise?)  Preferred Pharmacy (with phone number or street name): [facility] [address] [place], [place] - [address] [phone] (Phone) [phone] (Fax)    Agent: Please be advised that RX refills may take up to 3 business days. We ask that you follow-up with your pharmacy.

## 2018-10-13 NOTE — Telephone Encounter (Signed)
Rx sent to pt pharmacy for refill °

## 2018-10-15 ENCOUNTER — Other Ambulatory Visit: Payer: Self-pay | Admitting: Family Medicine

## 2018-10-15 DIAGNOSIS — M159 Polyosteoarthritis, unspecified: Secondary | ICD-10-CM

## 2018-10-16 NOTE — Telephone Encounter (Signed)
Called pt Tanya Gray for pt to call office for a 6 months f/u virtual visit for her Tramadol

## 2018-10-17 ENCOUNTER — Ambulatory Visit (INDEPENDENT_AMBULATORY_CARE_PROVIDER_SITE_OTHER): Payer: Medicare Other | Admitting: Family Medicine

## 2018-10-17 ENCOUNTER — Encounter: Payer: Self-pay | Admitting: Family Medicine

## 2018-10-17 ENCOUNTER — Other Ambulatory Visit: Payer: Self-pay

## 2018-10-17 DIAGNOSIS — M159 Polyosteoarthritis, unspecified: Secondary | ICD-10-CM | POA: Diagnosis not present

## 2018-10-17 MED ORDER — TRAMADOL HCL 50 MG PO TABS: 50.0000 mg | ORAL_TABLET | Freq: Four times a day (QID) | ORAL | 0 refills | Status: DC | PRN

## 2018-10-17 NOTE — Telephone Encounter (Signed)
Pt is scheduled for a medication f/u appointment at 10 am with Dr Salomon Fick
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
## 2019-01-21 ENCOUNTER — Other Ambulatory Visit: Payer: Self-pay | Admitting: Family Medicine

## 2019-01-21 DIAGNOSIS — M159 Polyosteoarthritis, unspecified: Secondary | ICD-10-CM

## 2019-01-21 NOTE — Telephone Encounter (Signed)
Please advise 

## 2019-02-04 ENCOUNTER — Other Ambulatory Visit: Payer: Self-pay | Admitting: Family Medicine

## 2019-02-09 ENCOUNTER — Other Ambulatory Visit: Payer: Self-pay | Admitting: Family Medicine

## 2019-03-23 ENCOUNTER — Other Ambulatory Visit: Payer: Self-pay | Admitting: Family Medicine

## 2019-03-23 DIAGNOSIS — M159 Polyosteoarthritis, unspecified: Secondary | ICD-10-CM

## 2019-03-23 NOTE — Telephone Encounter (Signed)
Pt LOV was 10/17/2018 and last refill was done on 01/22/2019, please advise

## 2019-05-01 ENCOUNTER — Other Ambulatory Visit: Payer: Self-pay | Admitting: Family Medicine

## 2019-05-01 NOTE — Telephone Encounter (Signed)
Forwarding medication refill requests to PCP for review. 

## 2019-05-01 NOTE — Telephone Encounter (Signed)
Medication Refill - Medication: pravastatin, hydrochlorotohiazide, potassium chloride, verapamil, omperazole, benazepril  Has the patient contacted their pharmacy? No. (Agent: If no, request that the patient contact the pharmacy for the refill.) (Agent: If yes, when and what did the pharmacy advise?)  Preferred Pharmacy (with phone number or street name):  [place] ([place] - [address]  [address] [place] [address]  Phone: [phone] Fax: [phone]  Not a 24 hour pharmacy; exact hours not known.     Agent: Please be advised that RX refills may take up to 3 business days. We ask that you follow-up with your pharmacy.

## 2019-05-04 ENCOUNTER — Other Ambulatory Visit: Payer: Self-pay | Admitting: Family Medicine

## 2019-05-21 ENCOUNTER — Telehealth: Payer: Self-pay | Admitting: *Deleted

## 2019-05-21 NOTE — Telephone Encounter (Signed)
Copied from [place] [id number]. Topic: General - Inquiry >> May 21, 2019 10:56 AM Tanya Gray wrote: Reason for CRM: Patient states she got a missed call from office and not sure why.  Do not see anything in her chart regarding.  Call back [phone]

## 2019-05-22 ENCOUNTER — Ambulatory Visit: Payer: Self-pay | Admitting: Family Medicine

## 2019-05-22 NOTE — Telephone Encounter (Signed)
Pt reports swelling of ankles and feet, onset 1 week ago. States "I have been using salt with my meals,I think it's from that." States swelling of ankles and feet only. Denies any discoloration,states tender with "Pressing on them." States "Leaves a dent where my foot meets my ankle." Pt states has been elevating, is able to wear shoes, "But tight." Pt states during triage, "I may have already spoken to the doctor about this the other day.I think she called me." Then stated "I  might have been dreaming." No documentation of virtual visit. After hours call. Care advise given per protocol and advised ED for any worsening of swelling, SOB. Pt verbalizes understanding. Unable to schedule appt. Please advise: [phone] Reason for Disposition . [1] MILD swelling of both ankles (i.e., pedal edema) AND [2] new onset or worsening  Answer Assessment - Initial Assessment Questions 1. ONSET: "When did the swelling start?" (e.g., minutes, hours, days)    1 week ago 2. LOCATION: "What part of the leg is swollen?"  "Are both legs swollen or just one leg?"    Ankles and feet 3. SEVERITY: "How bad is the swelling?" (e.g., localized; mild, moderate, severe)  - Localized - small area of swelling localized to one leg  - MILD pedal edema - swelling limited to foot and ankle, pitting edema < 1/4 inch (6 mm) deep, rest and elevation eliminate most or all swelling  - MODERATE edema - swelling of lower leg to knee, pitting edema > 1/4 inch (6 mm) deep, rest and elevation only partially reduce swelling  - SEVERE edema - swelling extends above knee, facial or hand swelling present      Feet and ankles 4. REDNESS: "Does the swelling look red or infected?"     no 5. PAIN: "Is the swelling painful to touch?" If so, ask: "How painful is it?"   (Scale 1-10; mild, moderate or severe)     With palpation. 6. FEVER: "Do you have a fever?" If so, ask: "What is it, how was it measured, and when did it start?"      no 7. CAUSE:  "What do you think is causing the leg swelling?"    Increased salt in diet 8. MEDICAL HISTORY: "Do you have a history of heart failure, kidney disease, liver failure, or cancer?"      9. RECURRENT SYMPTOM: "Have you had leg swelling before?" If so, ask: "When was the last time?" "What happened that time?"    Yes, off and on happens 10. OTHER SYMPTOMS: "Do you have any other symptoms?" (e.g., chest pain, difficulty breathing)       no  Protocols used: LEG SWELLING AND EDEMA-A-AH

## 2019-05-25 ENCOUNTER — Telehealth (INDEPENDENT_AMBULATORY_CARE_PROVIDER_SITE_OTHER): Payer: Medicare Other | Admitting: Family Medicine

## 2019-05-25 DIAGNOSIS — K219 Gastro-esophageal reflux disease without esophagitis: Secondary | ICD-10-CM | POA: Diagnosis not present

## 2019-05-25 DIAGNOSIS — E785 Hyperlipidemia, unspecified: Secondary | ICD-10-CM

## 2019-05-25 DIAGNOSIS — R600 Localized edema: Secondary | ICD-10-CM | POA: Diagnosis not present

## 2019-05-25 DIAGNOSIS — R079 Chest pain, unspecified: Secondary | ICD-10-CM | POA: Diagnosis not present

## 2019-05-25 DIAGNOSIS — I10 Essential (primary) hypertension: Secondary | ICD-10-CM | POA: Diagnosis not present

## 2019-05-25 NOTE — Telephone Encounter (Signed)
FYI Pt has been scheduled for a telephone visit this afternoon at 2 pm with dr Volanda Napoleon, pt is not familiar with using doxy for virtual visit and there are no providers with in office opening for this afternoon.

## 2019-05-25 NOTE — Telephone Encounter (Signed)
Message routed to PCP CMA  

## 2019-05-25 NOTE — Progress Notes (Signed)
Virtual Visit via Telephone Note  I connected with Tanya Gray on 05/25/19 at  2:00 PM EST by telephone and verified that I am speaking with the correct person using two identifiers.   I discussed the limitations, risks, security and privacy concerns of performing an evaluation and management service by telephone and the availability of in person appointments. I also discussed with the patient that there may be a patient responsible charge related to this service. The patient expressed understanding and agreed to proceed.  Location patient: home Location provider: work or home office Participants present for the call: patient, provider Patient did not have a visit in the prior 7 days to address this/these issue(s).   History of Present Illness: Pt had CP last wk, took generic alka seltzer which was kind of salty.  Pt took it regularly for several days for heart burn but noticed LE edema.  Pt endorses using white table salt more during the time her legs started swelling.  Pt has been eating some of everything.  Pt notes her legs have gone down some after d/c'ing the alka seltzer.  Ankle is still slightly puffy.  Pt now elevating her legs.  Pt states her bp has been "pretty good".  Checking bp but does not recall the numbers and has not written them down.  States has plenty of benazepril.  Pt knows her bp is up if her forehead feels hot.      Observations/Objective: Patient sounds cheerful and well on the phone. I do not appreciate any SOB. Speech and thought processing are grossly intact. Patient reported vitals:  Assessment and Plan: Bilateral lower extremity edema -likely 2/2 increased sodium intake -lifestyle modifications encouraged. -pt to elevated LEs when sitting. -discussed wearing TED hose or compression socks -continue current meds including HCTZ 25 mg daily - Plan: Basic Metabolic Panel, Brain Natriuretic Peptide, CBC (no diff)  Chest pain, unspecified type   -resolved -pt given precautions.  Advised to seek medical attention for continued CP or  - Plan: Lipid Panel  Gastroesophageal reflux disease, unspecified whether esophagitis present -pt advised to continue omeprazole 20 mg daily. -avoid foods known to cause problems  Essential hypertension  -continue current meds benazepril 40 mg, hctz 25 mg, klor-con 20 mEq, and verapamil 120 mg daily. -continue lifestyle modifications -pt encouraged to monitor bp at home and keep a log of the readings. - Plan: Basic Metabolic Panel  Dyslipidemia  - Plan: Lipid Panel   Follow Up Instructions: F/u prn in the next few wks.  Pt to have labs tomorrow.   I did not refer this patient for an OV in the next 24 hours for this/these issue(s).  I discussed the assessment and treatment plan with the patient. The patient was provided an opportunity to ask questions and all were answered. The patient agreed with the plan and demonstrated an understanding of the instructions.   The patient was advised to call back or seek an in-person evaluation if the symptoms worsen or if the condition fails to improve as anticipated.  I provided 26 minutes of non-face-to-face time during this encounter.   Billie Ruddy, MD

## 2019-05-25 NOTE — Telephone Encounter (Signed)
Spoke with pt verbalized that our office did not call her

## 2019-05-26 ENCOUNTER — Other Ambulatory Visit: Payer: Medicare Other

## 2019-05-28 ENCOUNTER — Other Ambulatory Visit: Payer: Self-pay

## 2019-05-29 ENCOUNTER — Other Ambulatory Visit (INDEPENDENT_AMBULATORY_CARE_PROVIDER_SITE_OTHER): Payer: Medicare Other

## 2019-05-29 DIAGNOSIS — I10 Essential (primary) hypertension: Secondary | ICD-10-CM

## 2019-05-29 DIAGNOSIS — E785 Hyperlipidemia, unspecified: Secondary | ICD-10-CM | POA: Diagnosis not present

## 2019-05-29 DIAGNOSIS — R600 Localized edema: Secondary | ICD-10-CM | POA: Diagnosis not present

## 2019-05-29 DIAGNOSIS — R079 Chest pain, unspecified: Secondary | ICD-10-CM

## 2019-05-29 LAB — BASIC METABOLIC PANEL
BUN: 13 mg/dL (ref 6–23)
CO2: 31 mEq/L (ref 19–32)
Calcium: 10.2 mg/dL (ref 8.4–10.5)
Chloride: 100 mEq/L (ref 96–112)
Creatinine, Ser: 1.09 mg/dL (ref 0.40–1.20)
GFR: 59.74 mL/min — ABNORMAL LOW (ref >60.00)
Glucose, Bld: 90 mg/dL (ref 70–99)
Potassium: 3.4 mEq/L — ABNORMAL LOW (ref 3.5–5.1)
Sodium: 142 mEq/L (ref 135–145)

## 2019-05-29 LAB — LIPID PANEL
Cholesterol: 226 mg/dL — ABNORMAL HIGH (ref 0–200)
HDL: 85 mg/dL (ref >39.00)
LDL Cholesterol: 130 mg/dL — ABNORMAL HIGH (ref 0–99)
NonHDL: 141.18
Total CHOL/HDL Ratio: 3
Triglycerides: 58 mg/dL (ref 0.0–149.0)
VLDL: 11.6 mg/dL (ref 0.0–40.0)

## 2019-05-29 LAB — CBC
HCT: 40.7 % (ref 36.0–46.0)
Hemoglobin: 13.3 g/dL (ref 12.0–15.0)
MCHC: 32.7 g/dL (ref 30.0–36.0)
MCV: 100.6 fl — ABNORMAL HIGH (ref 78.0–100.0)
Platelets: 237 10*3/uL (ref 150.0–400.0)
RBC: 4.04 Mil/uL (ref 3.87–5.11)
RDW: 14.9 % (ref 11.5–15.5)
WBC: 4 10*3/uL (ref 4.0–10.5)

## 2019-05-29 LAB — BRAIN NATRIURETIC PEPTIDE: Pro B Natriuretic peptide (BNP): 61 pg/mL (ref 0.0–100.0)

## 2019-06-01 ENCOUNTER — Other Ambulatory Visit: Payer: Self-pay | Admitting: Family Medicine

## 2019-06-01 NOTE — Telephone Encounter (Signed)
omeprazole (PRILOSEC) 20 MG capsule     Patient is requesting refill.     Pharmacy:  [facility] [place]), [place] - [address] Phone:  [phone]  Fax:  [phone]

## 2019-06-07 ENCOUNTER — Other Ambulatory Visit: Payer: Self-pay | Admitting: Family Medicine

## 2019-06-09 ENCOUNTER — Other Ambulatory Visit: Payer: Self-pay | Admitting: Family Medicine

## 2019-06-09 DIAGNOSIS — M159 Polyosteoarthritis, unspecified: Secondary | ICD-10-CM

## 2019-06-09 NOTE — Telephone Encounter (Signed)
Pt LOV was 05/25/2019 and last refill was in 03/23/2019 for 90 tablets, please advise

## 2019-06-10 NOTE — Telephone Encounter (Signed)
Last Rx given on 10/12 for #90 with no ref

## 2019-06-15 MED ORDER — TRAMADOL HCL 50 MG PO TABS: 50.0000 mg | ORAL_TABLET | Freq: Four times a day (QID) | ORAL | 0 refills | Status: DC | PRN

## 2019-06-15 NOTE — Addendum Note (Signed)
Addended by: Abbe Amsterdam R on: 06/15/2019 05:59 PM   Modules accepted: Orders

## 2019-06-23 DIAGNOSIS — Z1231 Encounter for screening mammogram for malignant neoplasm of breast: Secondary | ICD-10-CM | POA: Diagnosis not present

## 2019-06-23 LAB — HM MAMMOGRAPHY

## 2019-06-30 ENCOUNTER — Encounter: Payer: Self-pay | Admitting: Family Medicine

## 2019-06-30 DIAGNOSIS — N6001 Solitary cyst of right breast: Secondary | ICD-10-CM | POA: Diagnosis not present

## 2019-07-29 ENCOUNTER — Telehealth: Payer: Self-pay | Admitting: Family Medicine

## 2019-07-29 NOTE — Telephone Encounter (Signed)
Medication Refill: Tramadol Pharmacy: [facility]  Phone:

## 2019-07-31 NOTE — Telephone Encounter (Signed)
Spoke with pt state that  she still has a few tramadol left but she will be running out soon, advised pt that the Rx is not due for refill yet and should wait until sometime in March. Pt verbalized understanding

## 2019-08-12 ENCOUNTER — Telehealth: Payer: Self-pay | Admitting: *Deleted

## 2019-08-12 ENCOUNTER — Other Ambulatory Visit: Payer: Self-pay

## 2019-08-12 MED ORDER — POTASSIUM CHLORIDE CRYS ER 20 MEQ PO TBCR: 20.0000 meq | EXTENDED_RELEASE_TABLET | Freq: Every day | ORAL | 0 refills | Status: DC

## 2019-08-12 NOTE — Telephone Encounter (Signed)
Rx sent to pt pharmacy for refill °

## 2019-08-12 NOTE — Telephone Encounter (Signed)
Patient called the after hours line. Patient reports she needs a medication refill, Potasium Chloride pills. Patient reports she needs a new rx due to her dose was increased last month and since then she has been taking medication BID. Reports she has enough medication to last until office opens. Pharmacy is [facility] [phone].

## 2019-08-13 ENCOUNTER — Encounter: Payer: Self-pay | Admitting: Family Medicine

## 2019-08-13 NOTE — Telephone Encounter (Signed)
Spoke to pt and advised of update. Pt stated she has not received a call from pharmacy. I called pharmacy and Rx is ready for pick up. Pt notified of update. No further action needed.

## 2019-08-17 ENCOUNTER — Telehealth: Payer: Self-pay | Admitting: Family Medicine

## 2019-08-17 NOTE — Telephone Encounter (Signed)
Noted  

## 2019-08-17 NOTE — Telephone Encounter (Signed)
Pt states she received a letter for [employer]. She said she only drives when she has to (doctor, grocery store) but feels that her memory is not up to par for [employer]. Pt states that there is a section for her pcp to fill out that would remove her permeantlly from [employer]. She is wondering if Salomon Fick would fill it out?   Pt can be reached at [phone]   Informed pt that she will have to fill out a document through [place] in order for [facility] to fill that out. Pt stated she will drop it off if Salomon Fick is willing.

## 2019-08-17 NOTE — Telephone Encounter (Signed)
Pt called to inform that she does not need letter from previous message. She can take care of it herself.

## 2019-12-10 DIAGNOSIS — Z1331 Encounter for screening for depression: Secondary | ICD-10-CM | POA: Diagnosis not present

## 2019-12-10 DIAGNOSIS — I10 Essential (primary) hypertension: Secondary | ICD-10-CM | POA: Diagnosis not present

## 2019-12-10 DIAGNOSIS — K219 Gastro-esophageal reflux disease without esophagitis: Secondary | ICD-10-CM | POA: Diagnosis not present

## 2019-12-10 DIAGNOSIS — E785 Hyperlipidemia, unspecified: Secondary | ICD-10-CM | POA: Diagnosis not present

## 2019-12-10 DIAGNOSIS — E876 Hypokalemia: Secondary | ICD-10-CM | POA: Diagnosis not present

## 2019-12-10 DIAGNOSIS — R079 Chest pain, unspecified: Secondary | ICD-10-CM | POA: Diagnosis not present

## 2019-12-11 DIAGNOSIS — E7849 Other hyperlipidemia: Secondary | ICD-10-CM | POA: Diagnosis not present

## 2019-12-11 DIAGNOSIS — Z7689 Persons encountering health services in other specified circumstances: Secondary | ICD-10-CM | POA: Diagnosis not present

## 2019-12-18 ENCOUNTER — Ambulatory Visit: Payer: Self-pay | Admitting: Cardiology

## 2019-12-18 NOTE — Progress Notes (Deleted)
Date:  12/18/2019   ID:  Tanya Gray, DOB 03-Oct-1945, MRN [id number]  PCP:  [name], DO  Cardiologist:  [name], DO, [facility] (established care ***) Former Cardiology Providers: Dr. Loralie Champagne  REASON FOR CONSULT: Chest Pain  REQUESTING PHYSICIAN:  [name], [address],  [place] [id number]  No chief complaint on file.   HPI  Tanya Gray is a 74 y.o. female who presents to the office with a chief complaint of "***." She is referred to the office at the request of [name], DO. Patient's past medical history and cardiovascular risk factors include: ***  ***  History of  Denies prior history of coronary artery disease, myocardial infarction, congestive heart failure, deep venous thrombosis, pulmonary embolism, stroke, transient ischemic attack.  FUNCTIONAL STATUS: ***   ALLERGIES: No Known Allergies  MEDICATION LIST PRIOR TO VISIT: No outpatient medications have been marked as taking for the 12/18/19 encounter (Appointment) with [name], DO.     PAST MEDICAL HISTORY: Past Medical History:  Diagnosis Date  . AORTIC INSUFFICIENCY 08/03/2009  . Cardiomegaly 01/29/2007  . Diverticulosis of colon (without mention of hemorrhage) 08/08/2006  . ESOPHAGEAL STRICTURE 02/19/2008  . Heartburn 02/19/2008  . HYPERLIPIDEMIA 02/18/2008  . HYPERTENSION, BENIGN SYSTEMIC 08/08/2006  . MYALGIA 01/29/2007  . OSTEOARTHRITIS 12/21/2008  . PEPTIC ULCER DISEASE 12/21/2008  . RHINITIS, ALLERGIC 08/08/2006  . Unspecified asthma(493.90) 08/08/2006    PAST SURGICAL HISTORY: Past Surgical History:  Procedure Laterality Date  . ABDOMINAL HYSTERECTOMY    . ENDOTRACHEAL INTUBATION EMERGENT    . ESOPHAGOGASTRODUODENOSCOPY      FAMILY HISTORY: The patient family history includes Aneurysm in her sister; Diabetes in her mother; Hypertension in her mother; Lung disease in her mother.  SOCIAL HISTORY:  The patient  reports that she quit smoking about 16 years  ago. She has never used smokeless tobacco. She reports current alcohol use of about 1.0 - 2.0 standard drink of alcohol per week. She reports that she does not use drugs.  REVIEW OF SYSTEMS: ROS  PHYSICAL EXAM: Vitals with BMI 02/13/2018 11/07/2017 04/17/2017  Height - - 5' 2.5"  Weight 138 lbs 144 lbs 2 oz 156 lbs 3 oz  BMI - - 72.6  Systolic 203 559 741  Diastolic 68 62 64  Pulse 68 70 65    CONSTITUTIONAL: Well-developed and well-nourished. No acute distress.  SKIN: Skin is warm and dry. No rash noted. No cyanosis. No pallor. No jaundice HEAD: Normocephalic and atraumatic.  EYES: No scleral icterus MOUTH/THROAT: Moist oral membranes.  NECK: No JVD present. No thyromegaly noted. No carotid bruits  LYMPHATIC: No visible cervical adenopathy.  CHEST Normal respiratory effort. No intercostal retractions  LUNGS: *** No stridor. No wheezes. No rales.  CARDIOVASCULAR: *** ABDOMINAL: No apparent ascites.  EXTREMITIES: No peripheral edema  HEMATOLOGIC: No significant bruising NEUROLOGIC: Oriented to person, place, and time. Nonfocal. Normal muscle tone.  PSYCHIATRIC: Normal mood and affect. Normal behavior. Cooperative  CARDIAC DATABASE: EKG: ***  Echocardiogram: 8/08 (per EMR): mild LVH, EF 65%, mild aortic insufficiency.  Echo (3/11): Mild to moderate LVH, EF 65%, moderate aortic insufficiency, PA systolic pressure 37 mmHg.   Stress Testing: Per EMR: Exercise treadmill test in 02/2008.  The patient exercised for 7 minutes 30 seconds.   There was no evidence for ischemia by EKG.   Heart Catheterization: ***  Carotid duplex: ***  Vascular imaging: US Abdomen (AAA screening): Ultrasound arterial duplex: Ultrasound venous duplex:   Holter:  Mobile cardiac ambulatory telemetry:  LABORATORY DATA: CBC Latest Ref Rng & Units 05/29/2019 04/17/2017 03/07/2016  WBC 4.0 - 10.5 K/uL 4.0 4.2 4.5  Hemoglobin 12.0 - 15.0 g/dL 13.3 12.3 12.4  Hematocrit 36 - 46 % 40.7 38.2 37.6   Platelets 150 - 400 K/uL 237.0 236.0 210.0    CMP Latest Ref Rng & Units 05/29/2019 04/17/2017 03/07/2016  Glucose 70 - 99 mg/dL 90 89 95  BUN 6 - 23 mg/dL 13 13 20   Creatinine 0.40 - 1.20 mg/dL 1.09 0.96 1.19  Sodium 135 - 145 mEq/L 142 143 143  Potassium 3.5 - 5.1 mEq/L 3.4(L) 3.6 3.5  Chloride 96 - 112 mEq/L 100 102 103  CO2 19 - 32 mEq/L 31 33(H) 32  Calcium 8.4 - 10.5 mg/dL 10.2 10.1 9.1  Total Protein 6.0 - 8.3 g/dL - 6.5 6.1  Total Bilirubin 0.2 - 1.2 mg/dL - 0.4 0.5  Alkaline Phos 39 - 117 U/L - 39 44  AST 0 - 37 U/L - 17 21  ALT 0 - 35 U/L - 10 17    Lipid Panel     Component Value Date/Time   CHOL 226 (H) 05/29/2019 1029   TRIG 58.0 05/29/2019 1029   HDL 85.00 05/29/2019 1029   CHOLHDL 3 05/29/2019 1029   VLDL 11.6 05/29/2019 1029   LDLCALC 130 (H) 05/29/2019 1029    No components found for: NTPROBNP Recent Labs    05/29/19 1029  PROBNP 61.0   No results for input(s): TSH in the last 8760 hours.  BMP Recent Labs    05/29/19 1029  NA 142  K 3.4*  CL 100  CO2 31  GLUCOSE 90  BUN 13  CREATININE 1.09  CALCIUM 10.2    HEMOGLOBIN A1C Lab Results  Component Value Date   HGBA1C 5.8 07/21/2008   External Labs: Collected: 12/11/2019 Creatinine 0.8 mg/dL. eGFR: 85 mL/min per 1.73 m Lipid profile: Total cholesterol 216, triglycerides 41, HDL 63, LDL 145, non-HDL 153 TSH: 0.34   IMPRESSION:  No diagnosis found.   RECOMMENDATIONS: Tanya Gray is a 74 y.o. female whose past medical history and cardiac risk factors include: ***   FINAL MEDICATION LIST END OF ENCOUNTER: No orders of the defined types were placed in this encounter.   There are no discontinued medications.   Current Outpatient Medications:  .  aspirin 81 MG tablet, Take 81 mg by mouth daily.  , Disp: , Rfl:  .  benazepril (LOTENSIN) 40 MG tablet, TAKE 1 TABLET BY MOUTH ONCE DAILY IN THE MORNING, Disp: 90 tablet, Rfl: 0 .  calcium-vitamin D (OSCAL) 250-125 MG-UNIT per  tablet, Take 1 tablet by mouth daily.  , Disp: , Rfl:  .  hydrochlorothiazide (HYDRODIURIL) 25 MG tablet, TAKE 1 TABLET BY MOUTH ONCE DAILY IN THE MORNING, Disp: 90 tablet, Rfl: 0 .  omeprazole (PRILOSEC) 20 MG capsule, Take 1 capsule by mouth once daily in the morning, Disp: 90 capsule, Rfl: 0 .  potassium chloride SA (KLOR-CON) 20 MEQ tablet, Take 1 tablet (20 mEq total) by mouth daily., Disp: 30 tablet, Rfl: 0 .  pravastatin (PRAVACHOL) 40 MG tablet, Take 1 tablet by mouth once daily, Disp: 90 tablet, Rfl: 0 .  traMADol (ULTRAM) 50 MG tablet, Take 1 tablet (50 mg total) by mouth every 6 (six) hours as needed. for pain, Disp: 90 tablet, Rfl: 0 .  verapamil (CALAN) 120 MG tablet, Take 1 tablet by mouth twice daily, Disp: 180 tablet, Rfl: 0  No orders of the defined types were placed in this encounter.   There are no Patient Instructions on file for this visit.   --Continue cardiac medications as reconciled in final medication list. --No follow-ups on file. Or sooner if needed. --Continue follow-up with your primary care physician regarding the management of your other chronic comorbid conditions.  Patient's questions and concerns were addressed to her satisfaction. She voices understanding of the instructions provided during this encounter.   This note was created using a voice recognition software as a result there may be grammatical errors inadvertently enclosed that do not reflect the nature of this encounter. Every attempt is made to correct such errors.  [name], [place], [facility]  Pager: [phone] Office: [phone]

## 2020-01-04 ENCOUNTER — Other Ambulatory Visit: Payer: Self-pay

## 2020-01-04 ENCOUNTER — Ambulatory Visit: Payer: Medicare HMO | Admitting: Cardiology

## 2020-01-04 ENCOUNTER — Encounter: Payer: Self-pay | Admitting: Cardiology

## 2020-01-04 VITALS — BP 141/79 | HR 64 | Resp 17 | Ht 63.0 in | Wt 141.0 lb

## 2020-01-04 DIAGNOSIS — I10 Essential (primary) hypertension: Secondary | ICD-10-CM

## 2020-01-04 DIAGNOSIS — R072 Precordial pain: Secondary | ICD-10-CM | POA: Diagnosis not present

## 2020-01-04 DIAGNOSIS — M7989 Other specified soft tissue disorders: Secondary | ICD-10-CM

## 2020-01-04 DIAGNOSIS — Z87891 Personal history of nicotine dependence: Secondary | ICD-10-CM | POA: Diagnosis not present

## 2020-01-04 DIAGNOSIS — E782 Mixed hyperlipidemia: Secondary | ICD-10-CM | POA: Diagnosis not present

## 2020-01-04 NOTE — Progress Notes (Signed)
Date:  01/04/2020   ID:  Tanya Gray, DOB [birth year], MRN [id number]  PCP:  Tanya Margarita, DO  Cardiologist:  [name], DO, [facility] (established care 01/04/2020)  REASON FOR CONSULT: Chest Pain and LE swelling.   REQUESTING PHYSICIAN:  Tanya Margarita, DO Tanya Gray,  [place] [id number]  Chief Complaint  Patient presents with  . New Patient (Initial Visit)  . Chest Pain    HPI  Tanya Gray is a 74 y.o. female who presents to the office with a chief complaint of " chest pain and lower extremity swelling." She is referred to the office at the request of Tanya Margarita, DO. Patient's past medical history and cardiovascular risk factors include: Hypertension, Hyperlipidemia, Hx of mild aortic stenosis (per last office notes no echo), former smoker.   Chest Pain: Patient states that she had severe chest pain about 1 month ago thought it was secondary to cardiac etiology but did not seek attention at that time.  Patient has started to see Dr. Francesco Sor who referred her to the office for further evaluation.  Patient states her last episode of chest pain was 2 weeks ago.  The pain is constant anterior chest wall, intensity 8 out of 10, last for about 15 minutes in duration, associated at times with left arm numbness, sharp in sensation, not associated with effort related activities and does not resolve with resting.  Usually self-limited.  No associated symptoms of nausea, vomiting or diaphoresis during such episodes.  Lower extremity swelling: Patient states that her symptoms started approximately 1 month ago around the same time she had chest pain.  She is noted swelling to the mid shin.  They do not improve significantly in the morning.  Were to improve with elevating her legs.  No prior history of DVT or PE.  No long periods of immobilization.  Based on outside office records patient has questionable history of mild aortic stenosis.  No history of rheumatic  fever.  Denies prior history of coronary artery disease, myocardial infarction, congestive heart failure, deep venous thrombosis, pulmonary embolism, stroke, transient ischemic attack.  FUNCTIONAL STATUS: NO structured exercise program or daily routine.   ALLERGIES: No Known Allergies  MEDICATION LIST PRIOR TO VISIT: Current Meds  Medication Sig  . aspirin 81 MG tablet Take 81 mg by mouth daily.    . benazepril (LOTENSIN) 40 MG tablet TAKE 1 TABLET BY MOUTH ONCE DAILY IN THE MORNING (Patient taking differently: Take 40 mg by mouth daily. TAKE 1 TABLET BY MOUTH ONCE DAILY IN THE MORNING)  . calcium-vitamin D (OSCAL) 250-125 MG-UNIT per tablet Take 1 tablet by mouth daily.    . Cholecalciferol (VITAMIN D3) 50 MCG (2000 UT) TABS Take 1 tablet by mouth daily.  . hydrochlorothiazide (HYDRODIURIL) 25 MG tablet TAKE 1 TABLET BY MOUTH ONCE DAILY IN THE MORNING  . potassium chloride SA (KLOR-CON) 20 MEQ tablet Take 1 tablet (20 mEq total) by mouth daily.  . pravastatin (PRAVACHOL) 40 MG tablet Take 1 tablet by mouth once daily (Patient taking differently: Take 40 mg by mouth at bedtime. )  . traMADol (ULTRAM) 50 MG tablet Take 1 tablet (50 mg total) by mouth every 6 (six) hours as needed. for pain  . verapamil (CALAN) 120 MG tablet Take 1 tablet by mouth twice daily     PAST MEDICAL HISTORY: Past Medical History:  Diagnosis Date  . AORTIC INSUFFICIENCY 08/03/2009  . Cardiomegaly 01/29/2007  . Diverticulosis of colon (without  mention of hemorrhage) 08/08/2006  . ESOPHAGEAL STRICTURE 02/19/2008  . Heartburn 02/19/2008  . HYPERLIPIDEMIA 02/18/2008  . HYPERTENSION, BENIGN SYSTEMIC 08/08/2006  . MYALGIA 01/29/2007  . OSTEOARTHRITIS 12/21/2008  . PEPTIC ULCER DISEASE 12/21/2008  . RHINITIS, ALLERGIC 08/08/2006  . Unspecified asthma(493.90) 08/08/2006    PAST SURGICAL HISTORY: Past Surgical History:  Procedure Laterality Date  . ABDOMINAL HYSTERECTOMY    . ENDOTRACHEAL INTUBATION EMERGENT    .  ESOPHAGOGASTRODUODENOSCOPY      FAMILY HISTORY: The patient family history includes Aneurysm in her sister; Diabetes in her mother; Hypertension in her mother; Lung disease in her mother.  SOCIAL HISTORY:  The patient  reports that she quit smoking about 16 years ago. Her smoking use included cigarettes. She has a 15.00 pack-year smoking history. She has never used smokeless tobacco. She reports current alcohol use of about 1.0 - 2.0 standard drink of alcohol per week. She reports that she does not use drugs.  REVIEW OF SYSTEMS: Review of Systems  Constitutional: Negative for chills and fever.  HENT: Negative for hoarse voice and nosebleeds.   Eyes: Negative for discharge, double vision and pain.  Cardiovascular: Positive for chest pain and leg swelling. Negative for dyspnea on exertion, near-syncope, orthopnea, palpitations, paroxysmal nocturnal dyspnea and syncope.  Respiratory: Negative for hemoptysis and shortness of breath.   Musculoskeletal: Negative for muscle cramps and myalgias.  Gastrointestinal: Negative for abdominal pain, constipation, diarrhea, hematemesis, hematochezia, melena, nausea and vomiting.  Neurological: Negative for dizziness and light-headedness.   PHYSICAL EXAM: Vitals with BMI 01/04/2020 02/13/2018 11/07/2017  Height 5' 3"  - -  Weight 141 lbs 138 lbs 144 lbs 2 oz  BMI 33.29 - -  Systolic 518 841 660  Diastolic 79 68 62  Pulse 64 68 70    CONSTITUTIONAL: Well-developed and well-nourished. No acute distress.  SKIN: Skin is warm and dry. No rash noted. No cyanosis. No pallor. No jaundice HEAD: Normocephalic and atraumatic.  EYES: No scleral icterus MOUTH/THROAT: Moist oral membranes.  NECK: No JVD present. No thyromegaly noted. No carotid bruits  LYMPHATIC: No visible cervical adenopathy.  CHEST Normal respiratory effort. No intercostal retractions  LUNGS: Clear to auscultation bilaterally.  No stridor. No wheezes. No rales.  CARDIOVASCULAR: Regular,  positive Y3-K1, soft holosystolic murmur heard at the apex, soft systolic ejection murmur, no gallops or rubs. ABDOMINAL: No apparent ascites.  EXTREMITIES: 2+ bilateral pitting edema noted in the lower extremities up to the midshin level.  Pulses were difficult to palpate. HEMATOLOGIC: No significant bruising NEUROLOGIC: Oriented to person, place, and time. Nonfocal. Normal muscle tone.  PSYCHIATRIC: Normal mood and affect. Normal behavior. Cooperative  CARDIAC DATABASE: EKG: 01/04/2020: Normal sinus rhythm, 64 bpm, normal axis, poor R wave progression, nonspecific T wave abnormality.  Echocardiogram: None  Stress Testing: None   Heart Catheterization: None  LABORATORY DATA: External Labs: Collected: 12/11/2019 Creatinine 0.8 mg/dL. eGFR: 85 mL/min per 1.73 m Lipid profile: Total cholesterol 216, triglycerides 41, HDL 63, LDL 145, non-HDL 153 TSH: 0.34 and free T4 1.1 ng/mL  IMPRESSION:    ICD-10-CM   1. Precordial pain  R07.2 EKG 12-Lead    PCV ECHOCARDIOGRAM COMPLETE    Basic metabolic panel    Magnesium    Pro b natriuretic peptide (BNP)  2. Leg swelling  M79.89 PCV LOWER VENOUS US (BILATERAL)  3. Benign hypertension  I10   4. Mixed hyperlipidemia  E78.2   5. Former smoker  Z87.891      RECOMMENDATIONS: BETHANIE BLOXOM is a  74 y.o. female whose past medical history and cardiac risk factors include: Hypertension, Hyperlipidemia, Hx of mild aortic stenosis (per last office notes no echo), former smoker.   Chest pain:  EKG shows normal sinus rhythm without underlying injury pattern.  Echocardiogram will be ordered to evaluate for structural heart disease and left ventricular systolic function.  We will determine ischemic evaluation based on her LVEF.  Patient is asked to seek medical attention sooner by phone to the closest ER via EMS if she has recurrent chest pain that she experienced approximately 1 month ago.  I will have the front desk coordinate an  echocardiogram in the next 24 to 48 hours to reevaluate her LVEF.  And for now we will initiate diuretic therapy.  In the interim I will check BMP, magnesium, and BNP.  Lower extremity swelling: Check lower extremity duplex for chronic venous insufficiency versus DVT.  Will initiate diuretic therapy once labs are back.  Benign essential hypertension:  Currently managed by primary team  Medications reconciled.  Low-salt diet recommended.  Patient is asked to keep a log of her blood pressures and to bring them at the next office visit.  Mixed hyperlipidemia: Currently on statin therapy.  Managed per primary team.  Recommend uptitrating statin therapy as her LDL non-HDL levels are not at goal.  For now will defer management to primary team  Cardiac murmur:  Check echocardiogram to evaluate for valvular heart disease.  Physical examination patient appears to have degree of aortic stenosis and mitral regurgitation murmurs.  Thyroid management per primary team. Patient is encouraged to follow up with PCP.   FINAL MEDICATION LIST END OF ENCOUNTER: No orders of the defined types were placed in this encounter.   There are no discontinued medications.   Current Outpatient Medications:  .  aspirin 81 MG tablet, Take 81 mg by mouth daily.  , Disp: , Rfl:  .  benazepril (LOTENSIN) 40 MG tablet, TAKE 1 TABLET BY MOUTH ONCE DAILY IN THE MORNING (Patient taking differently: Take 40 mg by mouth daily. TAKE 1 TABLET BY MOUTH ONCE DAILY IN THE MORNING), Disp: 90 tablet, Rfl: 0 .  calcium-vitamin D (OSCAL) 250-125 MG-UNIT per tablet, Take 1 tablet by mouth daily.  , Disp: , Rfl:  .  Cholecalciferol (VITAMIN D3) 50 MCG (2000 UT) TABS, Take 1 tablet by mouth daily., Disp: , Rfl:  .  hydrochlorothiazide (HYDRODIURIL) 25 MG tablet, TAKE 1 TABLET BY MOUTH ONCE DAILY IN THE MORNING, Disp: 90 tablet, Rfl: 0 .  potassium chloride SA (KLOR-CON) 20 MEQ tablet, Take 1 tablet (20 mEq total) by mouth daily., Disp:  30 tablet, Rfl: 0 .  pravastatin (PRAVACHOL) 40 MG tablet, Take 1 tablet by mouth once daily (Patient taking differently: Take 40 mg by mouth at bedtime. ), Disp: 90 tablet, Rfl: 0 .  traMADol (ULTRAM) 50 MG tablet, Take 1 tablet (50 mg total) by mouth every 6 (six) hours as needed. for pain, Disp: 90 tablet, Rfl: 0 .  verapamil (CALAN) 120 MG tablet, Take 1 tablet by mouth twice daily, Disp: 180 tablet, Rfl: 0 .  omeprazole (PRILOSEC) 20 MG capsule, Take 1 capsule by mouth once daily in the morning (Patient not taking: Reported on 01/04/2020), Disp: 90 capsule, Rfl: 0  Orders Placed This Encounter  Procedures  . Basic metabolic panel  . Magnesium  . Pro b natriuretic peptide (BNP)  . EKG 12-Lead  . PCV ECHOCARDIOGRAM COMPLETE  . PCV LOWER VENOUS US (BILATERAL)  There are no Patient Instructions on file for this visit.   --Continue cardiac medications as reconciled in final medication list. --Return in about 1 week (around 01/11/2020) for re-evaluation of symptoms., review echo results.. Or sooner if needed. --Continue follow-up with your primary care physician regarding the management of your other chronic comorbid conditions.  Patient's questions and concerns were addressed to her satisfaction. She voices understanding of the instructions provided during this encounter.   This note was created using a voice recognition software as a result there may be grammatical errors inadvertently enclosed that do not reflect the nature of this encounter. Every attempt is made to correct such errors.  [name], [place], [facility]  Pager: [phone] Office: [phone]

## 2020-01-05 ENCOUNTER — Other Ambulatory Visit: Payer: Medicare HMO

## 2020-01-05 DIAGNOSIS — R072 Precordial pain: Secondary | ICD-10-CM | POA: Diagnosis not present

## 2020-01-05 DIAGNOSIS — I517 Cardiomegaly: Secondary | ICD-10-CM | POA: Diagnosis not present

## 2020-01-05 DIAGNOSIS — I359 Nonrheumatic aortic valve disorder, unspecified: Secondary | ICD-10-CM | POA: Diagnosis not present

## 2020-01-06 LAB — BASIC METABOLIC PANEL
BUN/Creatinine Ratio: 15 (ref 12–28)
BUN: 17 mg/dL (ref 8–27)
CO2: 25 mmol/L (ref 20–29)
Calcium: 9.7 mg/dL (ref 8.7–10.3)
Chloride: 106 mmol/L (ref 96–106)
Creatinine, Ser: 1.13 mg/dL — ABNORMAL HIGH (ref 0.57–1.00)
GFR calc Af Amer: 56 mL/min/{1.73_m2} — ABNORMAL LOW (ref >59)
GFR calc non Af Amer: 49 mL/min/{1.73_m2} — ABNORMAL LOW (ref >59)
Glucose: 89 mg/dL (ref 65–99)
Potassium: 4 mmol/L (ref 3.5–5.2)
Sodium: 146 mmol/L — ABNORMAL HIGH (ref 134–144)

## 2020-01-06 LAB — PRO B NATRIURETIC PEPTIDE: NT-Pro BNP: 524 pg/mL — ABNORMAL HIGH (ref 0–301)

## 2020-01-06 LAB — MAGNESIUM: Magnesium: 1.7 mg/dL (ref 1.6–2.3)

## 2020-01-08 ENCOUNTER — Ambulatory Visit: Payer: Medicare HMO

## 2020-01-08 ENCOUNTER — Other Ambulatory Visit: Payer: Self-pay

## 2020-01-08 DIAGNOSIS — M7989 Other specified soft tissue disorders: Secondary | ICD-10-CM

## 2020-01-08 DIAGNOSIS — R072 Precordial pain: Secondary | ICD-10-CM | POA: Diagnosis not present

## 2020-01-12 ENCOUNTER — Telehealth: Payer: Self-pay

## 2020-01-12 NOTE — Telephone Encounter (Signed)
-----   Message from [place], [place] sent at 01/10/2020 11:01 PM EDT ----- We will review the results at the upcoming office visit.

## 2020-01-12 NOTE — Telephone Encounter (Signed)
Called pt to inform her that we will go over her echo on her next visit.

## 2020-01-14 ENCOUNTER — Ambulatory Visit: Payer: Medicare HMO | Admitting: Cardiology

## 2020-01-15 ENCOUNTER — Ambulatory Visit: Payer: Medicaid Other | Admitting: Cardiology

## 2020-01-15 ENCOUNTER — Other Ambulatory Visit: Payer: Self-pay

## 2020-01-15 ENCOUNTER — Encounter: Payer: Self-pay | Admitting: Cardiology

## 2020-01-15 VITALS — BP 158/88 | HR 80 | Resp 17 | Ht 63.0 in | Wt 144.0 lb

## 2020-01-15 DIAGNOSIS — E782 Mixed hyperlipidemia: Secondary | ICD-10-CM

## 2020-01-15 DIAGNOSIS — Z87891 Personal history of nicotine dependence: Secondary | ICD-10-CM

## 2020-01-15 DIAGNOSIS — M7989 Other specified soft tissue disorders: Secondary | ICD-10-CM

## 2020-01-15 DIAGNOSIS — I10 Essential (primary) hypertension: Secondary | ICD-10-CM

## 2020-01-15 DIAGNOSIS — I5031 Acute diastolic (congestive) heart failure: Secondary | ICD-10-CM

## 2020-01-15 DIAGNOSIS — Z712 Person consulting for explanation of examination or test findings: Secondary | ICD-10-CM

## 2020-01-15 MED ORDER — TORSEMIDE 5 MG PO TABS: 5.0000 mg | ORAL_TABLET | Freq: Every morning | ORAL | 0 refills | Status: DC

## 2020-01-15 NOTE — Progress Notes (Signed)
Date:  01/15/2020   ID:  Tanya Gray, DOB November 16, 1945, MRN [id number]  PCP:  [name], DO  Cardiologist:  [name], DO, [facility] (established care 01/04/2020)  Date: 01/15/20 Last Office Visit: 01/04/2020  Chief Complaint  Patient presents with  . precordial pain  . Follow-up    1 week  . Results    HPI  Tanya Gray is a 74 y.o. female who presents to the office with a chief complaint of " reevaluation of chest pain and lower extremity swelling." Patient's past medical history and cardiovascular risk factors include: Hypertension, Hyperlipidemia, acute HFpEF/stage B/NYHA class II/III, former smoker.   Patient was originally referred to the office at the request of her primary care provider for evaluation and management of chest pain and lower extremity swelling.  At the last office visit she described her discomfort as constant pain in the anterior chest wall, intensity 8 out of 10, last for about 15 minutes in duration, associated at times with left arm numbness, sharp in sensation, not associated with effort related activities and does not resolve with resting.  Usually self-limited.  No associated symptoms of nausea, vomiting or diaphoresis during such episodes.  Since last office visit patient states that her chest pain has not reoccurred.  She continues to have swelling in bilateral lower extremity.  The swelling is up to the level of the mid shin.  She had blood work since last office visit with notes NT proBNP of 524 and kidney function mildly elevated in comparison to blood work from July 2021.  Swelling does not improve significantly in the morning.  No prior history of DVT or PE.  No long periods of immobilization.  Denies prior history of coronary artery disease, myocardial infarction, congestive heart failure, deep venous thrombosis, pulmonary embolism, stroke, transient ischemic attack.  FUNCTIONAL STATUS: No structured exercise program or daily routine.     ALLERGIES: No Known Allergies  MEDICATION LIST PRIOR TO VISIT: Current Meds  Medication Sig  . aspirin 81 MG tablet Take 81 mg by mouth daily.    . benazepril (LOTENSIN) 40 MG tablet TAKE 1 TABLET BY MOUTH ONCE DAILY IN THE MORNING (Patient taking differently: Take 40 mg by mouth daily. TAKE 1 TABLET BY MOUTH ONCE DAILY IN THE MORNING)  . calcium-vitamin D (OSCAL) 250-125 MG-UNIT per tablet Take 1 tablet by mouth daily.    . Cholecalciferol (VITAMIN D3) 50 MCG (2000 UT) TABS Take 1 tablet by mouth daily.  . hydrochlorothiazide (HYDRODIURIL) 25 MG tablet TAKE 1 TABLET BY MOUTH ONCE DAILY IN THE MORNING  . omeprazole (PRILOSEC) 20 MG capsule Take 1 capsule by mouth once daily in the morning  . potassium chloride SA (KLOR-CON) 20 MEQ tablet Take 1 tablet (20 mEq total) by mouth daily.  . pravastatin (PRAVACHOL) 40 MG tablet Take 1 tablet by mouth once daily (Patient taking differently: Take 40 mg by mouth at bedtime. )  . verapamil (CALAN) 120 MG tablet Take 1 tablet by mouth twice daily     PAST MEDICAL HISTORY: Past Medical History:  Diagnosis Date  . AORTIC INSUFFICIENCY 08/03/2009  . Cardiomegaly 01/29/2007  . Diverticulosis of colon (without mention of hemorrhage) 08/08/2006  . ESOPHAGEAL STRICTURE 02/19/2008  . Heartburn 02/19/2008  . HYPERLIPIDEMIA 02/18/2008  . HYPERTENSION, BENIGN SYSTEMIC 08/08/2006  . MYALGIA 01/29/2007  . OSTEOARTHRITIS 12/21/2008  . PEPTIC ULCER DISEASE 12/21/2008  . RHINITIS, ALLERGIC 08/08/2006  . Unspecified asthma(493.90) 08/08/2006    PAST SURGICAL HISTORY: Past Surgical  History:  Procedure Laterality Date  . ABDOMINAL HYSTERECTOMY    . ENDOTRACHEAL INTUBATION EMERGENT    . ESOPHAGOGASTRODUODENOSCOPY      FAMILY HISTORY: The patient family history includes Aneurysm in her sister; Diabetes in her mother; Hypertension in her mother; Lung disease in her mother.  SOCIAL HISTORY:  The patient  reports that she quit smoking about 16 years ago. Her  smoking use included cigarettes. She has a 15.00 pack-year smoking history. She has never used smokeless tobacco. She reports current alcohol use of about 1.0 - 2.0 standard drink of alcohol per week. She reports that she does not use drugs.  REVIEW OF SYSTEMS: Review of Systems  Constitutional: Negative for chills and fever.  HENT: Negative for hoarse voice and nosebleeds.   Eyes: Negative for discharge, double vision and pain.  Cardiovascular: Positive for chest pain, dyspnea on exertion and leg swelling. Negative for near-syncope, orthopnea, palpitations, paroxysmal nocturnal dyspnea and syncope.  Respiratory: Negative for hemoptysis and shortness of breath.   Musculoskeletal: Negative for muscle cramps and myalgias.  Gastrointestinal: Negative for abdominal pain, constipation, diarrhea, hematemesis, hematochezia, melena, nausea and vomiting.  Neurological: Negative for dizziness and light-headedness.   PHYSICAL EXAM: Vitals with BMI 01/15/2020 01/04/2020 02/13/2018  Height 5' 3" 5' 3" -  Weight 144 lbs 141 lbs 138 lbs  BMI 64.40 34.74 -  Systolic 259 563 875  Diastolic 88 79 68  Pulse 80 64 68    CONSTITUTIONAL: Well-developed and well-nourished. No acute distress.  SKIN: Skin is warm and dry. No rash noted. No cyanosis. No pallor. No jaundice HEAD: Normocephalic and atraumatic.  EYES: No scleral icterus MOUTH/THROAT: Moist oral membranes.  NECK: No JVD present. No thyromegaly noted. No carotid bruits  LYMPHATIC: No visible cervical adenopathy.  CHEST Normal respiratory effort. No intercostal retractions  LUNGS: Clear to auscultation bilaterally.  No stridor. No wheezes. No rales.  CARDIOVASCULAR: Regular, positive I4-P3, soft holosystolic murmur heard at the apex, soft systolic ejection murmur, no gallops or rubs. ABDOMINAL: No apparent ascites.  EXTREMITIES: 2+ bilateral pitting edema noted in the lower extremities up to the midshin level.  Pulses were difficult to  palpate. HEMATOLOGIC: No significant bruising NEUROLOGIC: Oriented to person, place, and time. Nonfocal. Normal muscle tone.  PSYCHIATRIC: Normal mood and affect. Normal behavior. Cooperative  CARDIAC DATABASE: EKG: 01/04/2020: Normal sinus rhythm, 64 bpm, normal axis, poor R wave progression, nonspecific T wave abnormality.  Echocardiogram: 01/08/2020: Normal LV systolic function with visual EF 65-70%. Left ventricle cavity is normal in size. Mild left ventricular hypertrophy. Normal global wall motion. Doppler evidence of grade II diastolic dysfunction, elevated LAP. Calculated EF 68%. Left atrial cavity is mildly dilated. Mild (Grade I) aortic regurgitation. Mild (Grade I) mitral regurgitation. Mild tricuspid regurgitation. No evidence of pulmonary hypertension. RVSP measures 30 mmHg. Moderate pulmonic regurgitation. Trivial pericardial effusion with no hemodynamic compromise. No prior study for comparison.  Stress Testing: None   Heart Catheterization: None  Lower Extremity Arterial Duplex 01/08/2020:  No hemodynamically significant stenoses are identified in the bilateral lower extremity arterial system.  This exam reveals normal perfusion of the right lower extremity (ABI 1.01) and mildly decreased perfusion of the left lower extremity, noted at the post tibial artery level (ABI 0.95).   LABORATORY DATA: External Labs: Collected: 12/11/2019 Creatinine 0.8 mg/dL. eGFR: 85 mL/min per 1.73 m Lipid profile: Total cholesterol 216, triglycerides 41, HDL 63, LDL 145, non-HDL 153 TSH: 0.34 and free T4 1.1 ng/mL  CMP Latest Ref Rng & Units  01/05/2020 05/29/2019 04/17/2017  Glucose 65 - 99 mg/dL 89 90 89  BUN 8 - 27 mg/dL _0 Creatinine 0.57 - 1.00 mg/dL 1.13(H) 1.09 0.96  Sodium 134 - 144 mmol/L 146(H) 142 143  Potassium 3.5 - 5.2 mmol/L 4.0 3.4(L) 3.6  Chloride 96 - 106 mmol/L 106 100 102  CO2 20 - 29 mmol/L 25 31 33(H)  Calcium 8.7 - 10.3 mg/dL 9.7 10.2 10.1  Total  Protein 6.0 - 8.3 g/dL - - 6.5  Total Bilirubin 0.2 - 1.2 mg/dL - - 0.4  Alkaline Phos 39 - 117 U/L - - 39  AST 0 - 37 U/L - - 17  ALT 0 - 35 U/L - - 10   Lipid Panel     Component Value Date/Time   CHOL 226 (H) 05/29/2019 1029   TRIG 58.0 05/29/2019 1029   HDL 85.00 05/29/2019 1029   CHOLHDL 3 05/29/2019 1029   VLDL 11.6 05/29/2019 1029   LDLCALC 130 (H) 05/29/2019 1029    No components found for: NTPROBNP Recent Labs    05/29/19 1029 01/05/20 1158  PROBNP 61.0 524*   No results for input(s): TSH in the last 8760 hours.  HEMOGLOBIN A1C Lab Results  Component Value Date   HGBA1C 5.8 07/21/2008   IMPRESSION:    ICD-10-CM   1. Acute heart failure with preserved ejection fraction (HFpEF) (HCC)  I50.31 torsemide (DEMADEX) 5 MG tablet    PCV MYOCARDIAL PERFUSION WITH LEXISCAN    Basic metabolic panel    Magnesium    Pro b natriuretic peptide (BNP)  2. Leg swelling  M79.89   3. Benign hypertension  I10   4. Mixed hyperlipidemia  E78.2   5. Former smoker  Z87.891   57. Encounter to discuss test results  Z71.2      RECOMMENDATIONS: Tanya Gray is a 74 y.o. female whose past medical history and cardiac risk factors include: Hypertension, Hyperlipidemia, acute HFpEF/stage B/NYHA class II/III, former smoker.   Acute heart failure with preserved EF, stage B, NYHA class II/III:  Dyspnea on exertion remains stable.  NT proBNP elevated, lower extremity swelling noted bilaterally up to the midshin.  Start torsemide 5 mg p.o. daily.  Patient is asked to have blood work done in 1 week to evaluate kidney function and electrolytes as she does have degree of acute kidney injury.  She verbalized understanding.  Medications reconciled.  We will initiate beta-blocker therapy once euvolemic.  Chest pain: Patient is symptoms of chest discomfort appear to be atypical at the last office visit.  Since last visit patient has not had reoccurrence of chest discomfort. Most  recent echocardiogram result noted preserved LVEF with diastolic dysfunction and mild valvular heart disease. Nuclear stress test recommended to evaluate for reversible ischemia.  Lower extremity swelling: Check lower extremity duplex for chronic venous insufficiency versus DVT.   Benign essential hypertension:  Currently managed by primary team  Medications reconciled.  Low-salt diet recommended.  Patient is asked to keep a log of her blood pressures and to bring them at the next office visit.  Mixed hyperlipidemia: Currently on statin therapy.  Managed per primary team.  Recommend uptitrating statin therapy as her LDL non-HDL levels are not at goal.  For now will defer management to primary team  FINAL MEDICATION LIST END OF ENCOUNTER: Meds ordered this encounter  Medications  . torsemide (DEMADEX) 5 MG tablet    Sig: Take 1 tablet (5 mg total) by mouth every morning.  Dispense:  30 tablet    Refill:  0    There are no discontinued medications.   Current Outpatient Medications:  .  aspirin 81 MG tablet, Take 81 mg by mouth daily.  , Disp: , Rfl:  .  benazepril (LOTENSIN) 40 MG tablet, TAKE 1 TABLET BY MOUTH ONCE DAILY IN THE MORNING (Patient taking differently: Take 40 mg by mouth daily. TAKE 1 TABLET BY MOUTH ONCE DAILY IN THE MORNING), Disp: 90 tablet, Rfl: 0 .  calcium-vitamin D (OSCAL) 250-125 MG-UNIT per tablet, Take 1 tablet by mouth daily.  , Disp: , Rfl:  .  Cholecalciferol (VITAMIN D3) 50 MCG (2000 UT) TABS, Take 1 tablet by mouth daily., Disp: , Rfl:  .  hydrochlorothiazide (HYDRODIURIL) 25 MG tablet, TAKE 1 TABLET BY MOUTH ONCE DAILY IN THE MORNING, Disp: 90 tablet, Rfl: 0 .  omeprazole (PRILOSEC) 20 MG capsule, Take 1 capsule by mouth once daily in the morning, Disp: 90 capsule, Rfl: 0 .  potassium chloride SA (KLOR-CON) 20 MEQ tablet, Take 1 tablet (20 mEq total) by mouth daily., Disp: 30 tablet, Rfl: 0 .  pravastatin (PRAVACHOL) 40 MG tablet, Take 1 tablet by  mouth once daily (Patient taking differently: Take 40 mg by mouth at bedtime. ), Disp: 90 tablet, Rfl: 0 .  verapamil (CALAN) 120 MG tablet, Take 1 tablet by mouth twice daily, Disp: 180 tablet, Rfl: 0 .  torsemide (DEMADEX) 5 MG tablet, Take 1 tablet (5 mg total) by mouth every morning., Disp: 30 tablet, Rfl: 0 .  traMADol (ULTRAM) 50 MG tablet, Take 1 tablet (50 mg total) by mouth every 6 (six) hours as needed. for pain (Patient not taking: Reported on 01/15/2020), Disp: 90 tablet, Rfl: 0  Orders Placed This Encounter  Procedures  . Basic metabolic panel  . Magnesium  . Pro b natriuretic peptide (BNP)  . PCV MYOCARDIAL PERFUSION WITH LEXISCAN    Patient Instructions  Labs in one week around 01/22/2020 Pick up water pill at the pharmacy.  Stress testing before the next office visit. Ultrasound of the legs.    --Continue cardiac medications as reconciled in final medication list. --Return in about 4 weeks (around 02/12/2020) for CHF and review test results. . Or sooner if needed. --Continue follow-up with your primary care physician regarding the management of your other chronic comorbid conditions.  Patient's questions and concerns were addressed to her satisfaction. She voices understanding of the instructions provided during this encounter.   This note was created using a voice recognition software as a result there may be grammatical errors inadvertently enclosed that do not reflect the nature of this encounter. Every attempt is made to correct such errors.  Total time spent: 35 minutes  Mechele Claude [facility]  Pager: [phone] Office: [phone]

## 2020-01-15 NOTE — Patient Instructions (Signed)
Labs in one week around 01/22/2020 Pick up water pill at the pharmacy.  Stress testing before the next office visit. Ultrasound of the legs.

## 2020-01-25 ENCOUNTER — Other Ambulatory Visit: Payer: Medicare Other

## 2020-01-29 ENCOUNTER — Other Ambulatory Visit: Payer: Medicare Other

## 2020-01-29 ENCOUNTER — Ambulatory Visit: Payer: Medicaid Other

## 2020-01-29 ENCOUNTER — Other Ambulatory Visit: Payer: Self-pay

## 2020-02-02 ENCOUNTER — Telehealth: Payer: Self-pay

## 2020-02-02 NOTE — Telephone Encounter (Signed)
-----   Message from [employer], [place] sent at 02/01/2020 11:19 PM EDT ----- No DVT. Normal venous return.

## 2020-02-02 NOTE — Telephone Encounter (Signed)
Called pt no answer, left a vm

## 2020-02-03 ENCOUNTER — Other Ambulatory Visit: Payer: Medicare Other

## 2020-02-03 ENCOUNTER — Telehealth: Payer: Self-pay | Admitting: Cardiology

## 2020-02-03 ENCOUNTER — Ambulatory Visit: Payer: Medicaid Other

## 2020-02-03 ENCOUNTER — Other Ambulatory Visit: Payer: Self-pay

## 2020-02-03 DIAGNOSIS — I5031 Acute diastolic (congestive) heart failure: Secondary | ICD-10-CM

## 2020-02-03 NOTE — Telephone Encounter (Signed)
Patient left before getting the second set of images (stress images) during her stress test today.   Office called several times to see if she can come back but was not able to reach her.   I also called her and Beryle Beams and no response.   Will try to arrange a 2 day protocol study next week if we can get hold off her.   ST

## 2020-02-08 ENCOUNTER — Other Ambulatory Visit: Payer: Medicaid Other

## 2020-02-08 ENCOUNTER — Other Ambulatory Visit: Payer: Self-pay

## 2020-02-12 ENCOUNTER — Ambulatory Visit: Payer: Medicare Other | Admitting: Cardiology

## 2020-02-16 NOTE — Progress Notes (Signed)
Patient did not answer left a voicemail SCH/RMA

## 2020-02-16 NOTE — Progress Notes (Signed)
2nd attempt : Called patient, NA, LMAM

## 2020-02-17 ENCOUNTER — Other Ambulatory Visit: Payer: Medicare Other

## 2020-02-18 ENCOUNTER — Other Ambulatory Visit: Payer: Self-pay

## 2020-02-18 ENCOUNTER — Ambulatory Visit: Payer: Medicaid Other | Admitting: Cardiology

## 2020-02-18 ENCOUNTER — Encounter: Payer: Self-pay | Admitting: Cardiology

## 2020-02-18 VITALS — BP 171/94 | HR 74 | Resp 17 | Ht 63.0 in | Wt 147.0 lb

## 2020-02-18 DIAGNOSIS — Z712 Person consulting for explanation of examination or test findings: Secondary | ICD-10-CM

## 2020-02-18 DIAGNOSIS — I10 Essential (primary) hypertension: Secondary | ICD-10-CM

## 2020-02-18 DIAGNOSIS — I5031 Acute diastolic (congestive) heart failure: Secondary | ICD-10-CM

## 2020-02-18 DIAGNOSIS — M7989 Other specified soft tissue disorders: Secondary | ICD-10-CM

## 2020-02-18 DIAGNOSIS — Z87891 Personal history of nicotine dependence: Secondary | ICD-10-CM

## 2020-02-18 DIAGNOSIS — E782 Mixed hyperlipidemia: Secondary | ICD-10-CM

## 2020-02-18 DIAGNOSIS — R9439 Abnormal result of other cardiovascular function study: Secondary | ICD-10-CM

## 2020-02-18 MED ORDER — HYDROCHLOROTHIAZIDE 25 MG PO TABS: ORAL_TABLET | ORAL | 0 refills | Status: DC

## 2020-02-18 MED ORDER — NITROGLYCERIN 0.4 MG SL SUBL: 0.4000 mg | SUBLINGUAL_TABLET | SUBLINGUAL | 0 refills | Status: DC | PRN

## 2020-02-18 MED ORDER — TORSEMIDE 5 MG PO TABS: 5.0000 mg | ORAL_TABLET | Freq: Every morning | ORAL | 0 refills | Status: DC

## 2020-02-18 NOTE — Progress Notes (Addendum)
Date:  02/18/2020   ID:  Tanya Gray, DOB 1945-08-09, MRN [id number]  PCP:  Tanya Margarita, DO  Cardiologist:  Tanya Kras, DO, [facility] (established care 01/04/2020)  Date: 02/18/20 Last Office Visit: 01/15/2020  Chief Complaint  Patient presents with  . Congestive Heart Failure  . Follow-up  . Results    LEXISCAN    HPI  Tanya Gray is a 74 y.o. female who presents to the office with a chief complaint of " congestive heart failure management and abnormal stress test." Patient's past medical history and cardiovascular risk factors include: Hypertension, Hyperlipidemia, acute HFpEF/stage B/NYHA class II/III, former smoker.   Patient presents with one of her church members Tanya Gray at today's office visit to discuss her abnormal stress test results.  She provides verbal consent in regards to discussing her medical information in her presence.  Patient states that Tanya Gray [phone] lives in [place] has the patient's power of attorney.  Patient was originally referred to the office at the request of her primary care provider for evaluation and management of chest pain and lower extremity swelling.  Initial office visit patient was complaining of atypical chest discomfort and given her cardiovascular risk factors was recommended to undergo stress test.  Patient did undergo a Lexiscan as a 2-day protocol was found EKG changes in the inferolateral leads and myocardial perfusion was abnormal with reversible ischemia in the lateral distribution suggestive underlying disease in the LCx territory.  Since last office visit patient has not had any chest pain at rest or with effort related activities.  But she has worsening lower extremity swelling and shortness of breath with effort related activities most likely secondary to being noncompliant with medical therapy.  Patient is suppose to be on hydrochlorothiazide and was started on torsemide at last office visit.  However, patient  never picked up his medications and comes today with worsening lower extremity swelling up to the knees.  She has had a DVT study in the past which was negative for deep venous thrombosis.  Given her underlying diagnosis of heart failure with preserved EF patient is enrolled into remote patient monitoring her congestive heart failure.  However, patient fails to check her weight on a daily basis.  Denies prior history of coronary artery disease, myocardial infarction, congestive heart failure, deep venous thrombosis, pulmonary embolism, stroke, transient ischemic attack.  FUNCTIONAL STATUS: No structured exercise program or daily routine.   ALLERGIES: No Known Allergies  MEDICATION LIST PRIOR TO VISIT: Current Meds  Medication Sig  . aspirin 81 MG tablet Take 81 mg by mouth daily.    . benazepril (LOTENSIN) 40 MG tablet TAKE 1 TABLET BY MOUTH ONCE DAILY IN THE MORNING (Patient taking differently: Take 40 mg by mouth every evening. )  . calcium-vitamin D (OSCAL) 250-125 MG-UNIT per tablet Take 1 tablet by mouth daily.    . Cholecalciferol (VITAMIN D3) 50 MCG (2000 UT) TABS Take 1 tablet by mouth daily.  Marland Kitchen omeprazole (PRILOSEC) 20 MG capsule Take 1 capsule by mouth once daily in the morning  . potassium chloride SA (KLOR-CON) 20 MEQ tablet Take 1 tablet (20 mEq total) by mouth daily.  . traMADol (ULTRAM) 50 MG tablet Take 1 tablet (50 mg total) by mouth every 6 (six) hours as needed. for pain  . verapamil (CALAN) 120 MG tablet Take 1 tablet by mouth twice daily     PAST MEDICAL HISTORY: Past Medical History:  Diagnosis Date  . AORTIC INSUFFICIENCY 08/03/2009  . Cardiomegaly  01/29/2007  . CHF (congestive heart failure) ([place])   . Diverticulosis of colon (without mention of hemorrhage) 08/08/2006  . ESOPHAGEAL STRICTURE 02/19/2008  . Heartburn 02/19/2008  . HYPERLIPIDEMIA 02/18/2008  . HYPERTENSION, BENIGN SYSTEMIC 08/08/2006  . MYALGIA 01/29/2007  . OSTEOARTHRITIS 12/21/2008  . PEPTIC ULCER  DISEASE 12/21/2008  . RHINITIS, ALLERGIC 08/08/2006  . Unspecified asthma(493.90) 08/08/2006    PAST SURGICAL HISTORY: Past Surgical History:  Procedure Laterality Date  . ABDOMINAL HYSTERECTOMY    . ENDOTRACHEAL INTUBATION EMERGENT    . ESOPHAGOGASTRODUODENOSCOPY      FAMILY HISTORY: The patient family history includes Aneurysm in her sister; Diabetes in her mother; Hypertension in her mother; Lung disease in her mother.  SOCIAL HISTORY:  The patient  reports that she quit smoking about 16 years ago. Her smoking use included cigarettes. She has a 15.00 pack-year smoking history. She has never used smokeless tobacco. She reports current alcohol use of about 1.0 - 2.0 standard drink of alcohol per week. She reports that she does not use drugs.  REVIEW OF SYSTEMS: Review of Systems  Constitutional: Negative for chills and fever.  HENT: Negative for hoarse voice and nosebleeds.   Eyes: Negative for discharge, double vision and pain.  Cardiovascular: Positive for dyspnea on exertion and leg swelling. Negative for chest pain, near-syncope, orthopnea, palpitations, paroxysmal nocturnal dyspnea and syncope.  Respiratory: Positive for shortness of breath. Negative for hemoptysis.   Musculoskeletal: Negative for muscle cramps and myalgias.  Gastrointestinal: Negative for abdominal pain, constipation, diarrhea, hematemesis, hematochezia, melena, nausea and vomiting.  Neurological: Negative for dizziness and light-headedness.   PHYSICAL EXAM: Vitals with BMI 02/18/2020 01/15/2020 01/04/2020  Height 5' 3"  5' 3"  5' 3"   Weight 147 lbs 144 lbs 141 lbs  BMI 26.05 82.70 78.67  Systolic 544 920 100  Diastolic 94 88 79  Pulse 74 80 64    CONSTITUTIONAL: Well-developed and well-nourished. No acute distress.  SKIN: Skin is warm and dry. No rash noted. No cyanosis. No pallor. No jaundice HEAD: Normocephalic and atraumatic.  EYES: No scleral icterus MOUTH/THROAT: Moist oral membranes.  NECK: No JVD  present. No thyromegaly noted. No carotid bruits  LYMPHATIC: No visible cervical adenopathy.  CHEST Normal respiratory effort. No intercostal retractions  LUNGS: Clear to auscultation bilaterally.  No stridor. No wheezes. No rales.  CARDIOVASCULAR: Regular, positive F1-Q1, soft holosystolic murmur heard at the apex, soft systolic ejection murmur, no gallops or rubs. ABDOMINAL: No apparent ascites.  EXTREMITIES: 2+ bilateral pitting edema noted in the lower extremities up to the midshin level.  Pulses were difficult to palpate. HEMATOLOGIC: No significant bruising NEUROLOGIC: Oriented to person, place, and time. Nonfocal. Normal muscle tone.  PSYCHIATRIC: Normal mood and affect. Normal behavior. Cooperative  CARDIAC DATABASE: EKG: 01/04/2020: Normal sinus rhythm, 64 bpm, normal axis, poor R wave progression, nonspecific T wave abnormality.  Echocardiogram: 01/08/2020: Normal LV systolic function with visual EF 65-70%. Left ventricle cavity is normal in size. Mild left ventricular hypertrophy. Normal global wall motion. Doppler evidence of grade II diastolic dysfunction, elevated LAP. Calculated EF 68%. Left atrial cavity is mildly dilated. Mild (Grade I) aortic regurgitation. Mild (Grade I) mitral regurgitation. Mild tricuspid regurgitation. No evidence of pulmonary hypertension. RVSP measures 30 mmHg. Moderate pulmonic regurgitation. Trivial pericardial effusion with no hemodynamic compromise. No prior study for comparison.  Stress Testing: Lexiscan Tetrofosmin Stress Test 02/08/2020: 2 day protocol.  Nondiagnostic ECG stress due to pharmacologic stress with Lexiscan. Non specific ST segment change noted in stress in inferolateral leads.  Myocardial perfusion is abnormal.  There is a reversible moderate defect in the lateral and apical regions.  Overall LV systolic function is abnormal with regional wall motion abnormalities. Stress LV EF: 44% with hypokinesis in the same region.  No  previous exam available for comparison. Intermediate risk study.   Heart Catheterization: None  Lower Extremity Arterial Duplex 01/08/2020:  No hemodynamically significant stenoses are identified in the bilateral lower extremity arterial system.  This exam reveals normal perfusion of the right lower extremity (ABI 1.01) and mildly decreased perfusion of the left lower extremity, noted at the post tibial artery level (ABI 0.95).   Lower Extremity Venous Duplex Bilateral 01/29/2020:  No evidence of deep vein thrombosis of the lower extremities with normal venous return.  Generalized tissue edema noted.   LABORATORY DATA: External Labs: Collected: 12/11/2019 Creatinine 0.8 mg/dL. eGFR: 85 mL/min per 1.73 m Lipid profile: Total cholesterol 216, triglycerides 41, HDL 63, LDL 145, non-HDL 153 TSH: 0.34 and free T4 1.1 ng/mL  CMP Latest Ref Rng & Units 01/05/2020 05/29/2019 04/17/2017  Glucose 65 - 99 mg/dL 89 90 89  BUN 8 - 27 mg/dL 17 13 13   Creatinine 0.57 - 1.00 mg/dL 1.13(H) 1.09 0.96  Sodium 134 - 144 mmol/L 146(H) 142 143  Potassium 3.5 - 5.2 mmol/L 4.0 3.4(L) 3.6  Chloride 96 - 106 mmol/L 106 100 102  CO2 20 - 29 mmol/L 25 31 33(H)  Calcium 8.7 - 10.3 mg/dL 9.7 10.2 10.1  Total Protein 6.0 - 8.3 g/dL - - 6.5  Total Bilirubin 0.2 - 1.2 mg/dL - - 0.4  Alkaline Phos 39 - 117 U/L - - 39  AST 0 - 37 U/L - - 17  ALT 0 - 35 U/L - - 10   Lipid Panel     Component Value Date/Time   CHOL 226 (H) 05/29/2019 1029   TRIG 58.0 05/29/2019 1029   HDL 85.00 05/29/2019 1029   CHOLHDL 3 05/29/2019 1029   VLDL 11.6 05/29/2019 1029   LDLCALC 130 (H) 05/29/2019 1029    No components found for: NTPROBNP Recent Labs    05/29/19 1029 01/05/20 1158  PROBNP 61.0 524*   No results for input(s): TSH in the last 8760 hours.  HEMOGLOBIN A1C Lab Results  Component Value Date   HGBA1C 5.8 07/21/2008   IMPRESSION:    ICD-10-CM   1. Abnormal nuclear stress test  R94.39 nitroGLYCERIN  (NITROSTAT) 0.4 MG SL tablet    CBC    BASIC METABOLIC PANEL WITH GFR    Magnesium    SARS-CoV-2 Antibody, IgM  2. Acute heart failure with preserved ejection fraction (HFpEF) (HCC)  I50.31 torsemide (DEMADEX) 5 MG tablet    hydrochlorothiazide (HYDRODIURIL) 25 MG tablet  3. Leg swelling  M79.89 torsemide (DEMADEX) 5 MG tablet    hydrochlorothiazide (HYDRODIURIL) 25 MG tablet  4. Benign hypertension  I10   5. Mixed hyperlipidemia  E78.2   6. Former smoker  Z87.891   22. Encounter to discuss test results  Z71.2      RECOMMENDATIONS: AVERIANA CLOUATRE is a 74 y.o. female whose past medical history and cardiac risk factors include: Hypertension, Hyperlipidemia, acute HFpEF/stage B/NYHA class III, former smoker.   Abnormal nuclear stress test:   Given her symptoms of atypical chest pain and multiple cardiovascular risk factors patient was recommended to undergo stress test at prior office visits.  Lexiscan noted reversible ischemia in the lateral wall suggestive of ischemia in the LCx distribution.  EKG also noted  inferolateral changes.  Patient recommended to undergo left heart catheterization with possible intervention given her history of chest discomfort stress test.  Patient agreeable.  The left heart catheterization procedure was explained to the patient in detail. The indication, alternatives, risks and benefits were reviewed. Complications including but not limited to bleeding, infection, acute kidney injury, blood transfusion, heart rhythm disturbances, contrast (dye) reaction, damage to the arteries or nerves in the legs or hands, cerebrovascular accident, myocardial infarction, need for emergent bypass surgery, blood clots in the legs, possible need for emergent blood transfusion, and rarely death were reviewed and discussed with the patient. The patient voices understanding and wishes to proceed.   Sublingual nitroglycerin tablets to use as needed basis.  Medication profile  discussed.  Acute heart failure with preserved EF, stage B, NYHA class III:  Since last office visit patient has effort related dyspnea and worsening lower extremity swelling.  Most likely secondary to being noncompliant with diuretic therapy.  Refilled hydrochlorothiazide and torsemide.  Torsemide was prescribed at the last office visit but she did not fill the prescription.  Blood work in 1 week to evaluate kidney function electrolytes since initiation of diuretic therapy.  Patient is enrolled into remote patient monitoring for congestive heart failure.    Medications reconciled.  We will initiate beta-blocker therapy once euvolemic.  Lower extremity swelling: Checked lower extremity duplex for chronic venous insufficiency versus DVT but it was unremarkable.   Benign essential hypertension:  Currently managed by primary team  Medications reconciled.  Low-salt diet recommended.  Patient is asked to keep a log of her blood pressures and to bring them at the next office visit.  Mixed hyperlipidemia: Currently on statin therapy.  Since last visit her pravastatin dose was increased to 72m po qhs. Managed per primary team.    FINAL MEDICATION LIST END OF ENCOUNTER: Meds ordered this encounter  Medications  . torsemide (DEMADEX) 5 MG tablet    Sig: Take 1 tablet (5 mg total) by mouth every morning.    Dispense:  30 tablet    Refill:  0  . hydrochlorothiazide (HYDRODIURIL) 25 MG tablet    Sig: TAKE 1 TABLET BY MOUTH ONCE DAILY IN THE MORNING    Dispense:  90 tablet    Refill:  0  . nitroGLYCERIN (NITROSTAT) 0.4 MG SL tablet    Sig: Place 1 tablet (0.4 mg total) under the tongue every 5 (five) minutes as needed for chest pain. If you require more than two tablets five minutes apart go to the nearest ER via EMS.    Dispense:  30 tablet    Refill:  0    Medications Discontinued During This Encounter  Medication Reason  . hydrochlorothiazide (HYDRODIURIL) 25 MG tablet Reorder   . torsemide (DEMADEX) 5 MG tablet Reorder     Current Outpatient Medications:  .  aspirin 81 MG tablet, Take 81 mg by mouth daily.  , Disp: , Rfl:  .  benazepril (LOTENSIN) 40 MG tablet, TAKE 1 TABLET BY MOUTH ONCE DAILY IN THE MORNING (Patient taking differently: Take 40 mg by mouth every evening. ), Disp: 90 tablet, Rfl: 0 .  calcium-vitamin D (OSCAL) 250-125 MG-UNIT per tablet, Take 1 tablet by mouth daily.  , Disp: , Rfl:  .  Cholecalciferol (VITAMIN D3) 50 MCG (2000 UT) TABS, Take 1 tablet by mouth daily., Disp: , Rfl:  .  omeprazole (PRILOSEC) 20 MG capsule, Take 1 capsule by mouth once daily in the morning, Disp: 90 capsule, Rfl:  0 .  potassium chloride SA (KLOR-CON) 20 MEQ tablet, Take 1 tablet (20 mEq total) by mouth daily., Disp: 30 tablet, Rfl: 0 .  traMADol (ULTRAM) 50 MG tablet, Take 1 tablet (50 mg total) by mouth every 6 (six) hours as needed. for pain, Disp: 90 tablet, Rfl: 0 .  verapamil (CALAN) 120 MG tablet, Take 1 tablet by mouth twice daily, Disp: 180 tablet, Rfl: 0 .  hydrochlorothiazide (HYDRODIURIL) 25 MG tablet, TAKE 1 TABLET BY MOUTH ONCE DAILY IN THE MORNING, Disp: 90 tablet, Rfl: 0 .  nitroGLYCERIN (NITROSTAT) 0.4 MG SL tablet, Place 1 tablet (0.4 mg total) under the tongue every 5 (five) minutes as needed for chest pain. If you require more than two tablets five minutes apart go to the nearest ER via EMS., Disp: 30 tablet, Rfl: 0 .  pravastatin (PRAVACHOL) 40 MG tablet, Take 1 tablet by mouth once daily (Patient taking differently: Take 40 mg by mouth at bedtime. ), Disp: 90 tablet, Rfl: 0 .  torsemide (DEMADEX) 5 MG tablet, Take 1 tablet (5 mg total) by mouth every morning., Disp: 30 tablet, Rfl: 0  Orders Placed This Encounter  Procedures  . CBC  . BASIC METABOLIC PANEL WITH GFR  . Magnesium  . SARS-CoV-2 Antibody, IgM   There are no Patient Instructions on file for this visit.  --Continue cardiac medications as reconciled in final medication list. --Return  in about 4 weeks (around 03/17/2020) for Post heart catheterization. Or sooner if needed. --Continue follow-up with your primary care physician regarding the management of your other chronic comorbid conditions.  Patient's questions and concerns were addressed to her satisfaction. She voices understanding of the instructions provided during this encounter.   This note was created using a voice recognition software as a result there may be grammatical errors inadvertently enclosed that do not reflect the nature of this encounter. Every attempt is made to correct such errors.  Total time spent: 50 minutes greater than 50% of time spent face-to-face discussing the management of congestive heart failure and reviewing the risk/benifit/ alternatives of left heart catheterization .  AddendumGeorgiann Gray [phone]) who has the patient's DPOA called for an update. Called her back 5:30pm reviewed the indications of why left heart catheterization was recommended.  Reviewed the risks, benefits and alternatives with her over the phone discussed above.  She is agreeable with the plan of care.  She can be called on the day of the procedure for verbal consent if needed.   Tanya Gray, [place], [facility]  Pager: [phone] Office: [phone]

## 2020-02-19 ENCOUNTER — Other Ambulatory Visit (HOSPITAL_COMMUNITY)
Admission: RE | Admit: 2020-02-19 | Discharge: 2020-02-19 | Disposition: A | Discharge status: Home / Self Care | Payer: Medicare Other | Source: Ambulatory Visit | Attending: Cardiology | Admitting: Cardiology

## 2020-02-19 DIAGNOSIS — Z01812 Encounter for preprocedural laboratory examination: Secondary | ICD-10-CM | POA: Diagnosis present

## 2020-02-19 DIAGNOSIS — Z20822 Contact with and (suspected) exposure to covid-19: Secondary | ICD-10-CM | POA: Diagnosis not present

## 2020-02-19 LAB — CBC
Hematocrit: 36.7 % (ref 34.0–46.6)
Hemoglobin: 12.3 g/dL (ref 11.1–15.9)
MCH: 32.5 pg (ref 26.6–33.0)
MCHC: 33.5 g/dL (ref 31.5–35.7)
MCV: 97 fL (ref 79–97)
Platelets: 234 10*3/uL (ref 150–450)
RBC: 3.78 x10E6/uL (ref 3.77–5.28)
RDW: 12.4 % (ref 11.7–15.4)
WBC: 3.1 10*3/uL — ABNORMAL LOW (ref 3.4–10.8)

## 2020-02-19 LAB — SARS CORONAVIRUS 2 (TAT 6-24 HRS): SARS Coronavirus 2: NEGATIVE

## 2020-02-19 LAB — SARS-COV-2 ANTIBODY, IGM: SARS-CoV-2 Antibody, IgM: NEGATIVE

## 2020-02-19 LAB — MAGNESIUM: Magnesium: 2 mg/dL (ref 1.6–2.3)

## 2020-02-23 ENCOUNTER — Ambulatory Visit (HOSPITAL_COMMUNITY)
Admission: RE | Admit: 2020-02-23 | Discharge: 2020-02-23 | Disposition: A | Discharge status: Home / Self Care | Payer: Medicare Other | Attending: Cardiology | Admitting: Cardiology

## 2020-02-23 ENCOUNTER — Encounter (HOSPITAL_COMMUNITY): Payer: Self-pay | Admitting: Cardiology

## 2020-02-23 ENCOUNTER — Encounter (HOSPITAL_COMMUNITY)
Admission: RE | Disposition: A | Discharge status: Home / Self Care | Payer: Self-pay | Source: Home / Self Care | Attending: Cardiology

## 2020-02-23 DIAGNOSIS — I110 Hypertensive heart disease with heart failure: Secondary | ICD-10-CM | POA: Insufficient documentation

## 2020-02-23 DIAGNOSIS — I351 Nonrheumatic aortic (valve) insufficiency: Secondary | ICD-10-CM | POA: Diagnosis not present

## 2020-02-23 DIAGNOSIS — J45909 Unspecified asthma, uncomplicated: Secondary | ICD-10-CM | POA: Insufficient documentation

## 2020-02-23 DIAGNOSIS — Z7982 Long term (current) use of aspirin: Secondary | ICD-10-CM | POA: Insufficient documentation

## 2020-02-23 DIAGNOSIS — I509 Heart failure, unspecified: Secondary | ICD-10-CM | POA: Insufficient documentation

## 2020-02-23 DIAGNOSIS — E782 Mixed hyperlipidemia: Secondary | ICD-10-CM | POA: Insufficient documentation

## 2020-02-23 DIAGNOSIS — R9439 Abnormal result of other cardiovascular function study: Secondary | ICD-10-CM | POA: Diagnosis present

## 2020-02-23 DIAGNOSIS — R0789 Other chest pain: Secondary | ICD-10-CM | POA: Diagnosis not present

## 2020-02-23 DIAGNOSIS — Z87891 Personal history of nicotine dependence: Secondary | ICD-10-CM | POA: Diagnosis not present

## 2020-02-23 DIAGNOSIS — Z79899 Other long term (current) drug therapy: Secondary | ICD-10-CM | POA: Insufficient documentation

## 2020-02-23 DIAGNOSIS — R079 Chest pain, unspecified: Secondary | ICD-10-CM | POA: Diagnosis present

## 2020-02-23 LAB — BASIC METABOLIC PANEL
Anion gap: 8 (ref 5–15)
BUN: 9 mg/dL (ref 8–23)
CO2: 29 mmol/L (ref 22–32)
Calcium: 9.3 mg/dL (ref 8.9–10.3)
Chloride: 107 mmol/L (ref 98–111)
Creatinine, Ser: 1.08 mg/dL — ABNORMAL HIGH (ref 0.44–1.00)
GFR calc Af Amer: 59 mL/min — ABNORMAL LOW (ref >60)
GFR calc non Af Amer: 51 mL/min — ABNORMAL LOW (ref >60)
Glucose, Bld: 84 mg/dL (ref 70–99)
Potassium: 3.5 mmol/L (ref 3.5–5.1)
Sodium: 144 mmol/L (ref 135–145)

## 2020-02-23 SURGERY — LEFT HEART CATH AND CORONARY ANGIOGRAPHY
Anesthesia: LOCAL

## 2020-02-23 MED ORDER — SODIUM CHLORIDE 0.9% FLUSH: 3.0000 mL | INTRAVENOUS | Status: DC | PRN

## 2020-02-23 MED ORDER — MIDAZOLAM HCL 2 MG/2ML IJ SOLN
INTRAMUSCULAR | Status: DC | PRN
  Administered 2020-02-23: 1 mg via INTRAVENOUS

## 2020-02-23 MED ORDER — ASPIRIN 81 MG PO CHEW
81.0000 mg | CHEWABLE_TABLET | ORAL | Status: AC
  Administered 2020-02-23: 81 mg via ORAL
  Filled 2020-02-23: qty 1

## 2020-02-23 MED ORDER — HEPARIN (PORCINE) IN NACL 1000-0.9 UT/500ML-% IV SOLN
INTRAVENOUS | Status: DC | PRN
  Administered 2020-02-23: 500 mL

## 2020-02-23 MED ORDER — SODIUM CHLORIDE 0.9 % IV SOLN: 250.0000 mL | INTRAVENOUS | Status: DC | PRN

## 2020-02-23 MED ORDER — SODIUM CHLORIDE 0.9% FLUSH: 3.0000 mL | Freq: Two times a day (BID) | INTRAVENOUS | Status: DC

## 2020-02-23 MED ORDER — IOHEXOL 350 MG/ML SOLN
INTRAVENOUS | Status: DC | PRN
  Administered 2020-02-23: 40 mL

## 2020-02-23 MED ORDER — LIDOCAINE HCL (PF) 1 % IJ SOLN
INTRAMUSCULAR | Status: AC
  Filled 2020-02-23: qty 30

## 2020-02-23 MED ORDER — SODIUM CHLORIDE 0.9 % WEIGHT BASED INFUSION
3.0000 mL/kg/h | INTRAVENOUS | Status: AC
  Administered 2020-02-23: 3 mL/kg/h via INTRAVENOUS

## 2020-02-23 MED ORDER — HEPARIN SODIUM (PORCINE) 1000 UNIT/ML IJ SOLN
INTRAMUSCULAR | Status: AC
  Filled 2020-02-23: qty 1

## 2020-02-23 MED ORDER — HYDRALAZINE HCL 20 MG/ML IJ SOLN: 10.0000 mg | INTRAMUSCULAR | Status: DC | PRN

## 2020-02-23 MED ORDER — MIDAZOLAM HCL 2 MG/2ML IJ SOLN
INTRAMUSCULAR | Status: AC
  Filled 2020-02-23: qty 2

## 2020-02-23 MED ORDER — FENTANYL CITRATE (PF) 100 MCG/2ML IJ SOLN
INTRAMUSCULAR | Status: DC | PRN
Start: 2020-02-23 — End: 2020-02-23
  Administered 2020-02-23: 50 ug via INTRAVENOUS

## 2020-02-23 MED ORDER — SODIUM CHLORIDE 0.9 % IV SOLN: INTRAVENOUS | Status: AC

## 2020-02-23 MED ORDER — FENTANYL CITRATE (PF) 100 MCG/2ML IJ SOLN
INTRAMUSCULAR | Status: AC
  Filled 2020-02-23: qty 2

## 2020-02-23 MED ORDER — ACETAMINOPHEN 325 MG PO TABS: 650.0000 mg | ORAL_TABLET | ORAL | Status: DC | PRN

## 2020-02-23 MED ORDER — VERAPAMIL HCL 2.5 MG/ML IV SOLN
INTRAVENOUS | Status: DC | PRN
  Administered 2020-02-23: 10 mL via INTRA_ARTERIAL

## 2020-02-23 MED ORDER — HEPARIN SODIUM (PORCINE) 1000 UNIT/ML IJ SOLN
INTRAMUSCULAR | Status: DC | PRN
  Administered 2020-02-23: 4000 [IU] via INTRAVENOUS

## 2020-02-23 MED ORDER — LABETALOL HCL 5 MG/ML IV SOLN: 10.0000 mg | INTRAVENOUS | Status: DC | PRN

## 2020-02-23 MED ORDER — VERAPAMIL HCL 2.5 MG/ML IV SOLN
INTRAVENOUS | Status: AC
  Filled 2020-02-23: qty 2

## 2020-02-23 MED ORDER — ONDANSETRON HCL 4 MG/2ML IJ SOLN: 4.0000 mg | Freq: Four times a day (QID) | INTRAMUSCULAR | Status: DC | PRN

## 2020-02-23 MED ORDER — HEPARIN (PORCINE) IN NACL 1000-0.9 UT/500ML-% IV SOLN
INTRAVENOUS | Status: AC
  Filled 2020-02-23: qty 500

## 2020-02-23 MED ORDER — SODIUM CHLORIDE 0.9 % WEIGHT BASED INFUSION: 1.0000 mL/kg/h | INTRAVENOUS | Status: DC

## 2020-02-23 SURGICAL SUPPLY — 10 items
CATH OPTITORQUE TIG 4.0 5F (CATHETERS) ×2 IMPLANT
DEVICE RAD COMP TR BAND LRG (VASCULAR PRODUCTS) ×2 IMPLANT
GUIDEWIRE INQWIRE 1.5J.035X260 (WIRE) ×1 IMPLANT
INQWIRE 1.5J .035X260CM (WIRE) ×2
KIT HEART LEFT (KITS) ×2 IMPLANT
PACK CARDIAC CATHETERIZATION (CUSTOM PROCEDURE TRAY) ×2 IMPLANT
SHEATH GLIDE SLENDER 4/5FR (SHEATH) ×2 IMPLANT
SHEATH PROBE COVER 6X72 (BAG) ×2 IMPLANT
TRANSDUCER W/STOPCOCK (MISCELLANEOUS) ×2 IMPLANT
TUBING CIL FLEX 10 FLL-RA (TUBING) ×2 IMPLANT

## 2020-02-23 NOTE — Discharge Instructions (Signed)
DRINK PLENTY OF FLUIDS FOR THE NEXT 2-3 DAYS.  KEEP ARM ELEVATED THE REMAINDER OF THE DAY.  Radial Site Care  This sheet gives you information about how to care for yourself after your procedure. Your health care provider may also give you more specific instructions. If you have problems or questions, contact your health care provider. What can I expect after the procedure? After the procedure, it is common to have:  Bruising and tenderness at the catheter insertion area. Follow these instructions at home: Medicines  Take over-the-counter and prescription medicines only as told by your health care provider. Insertion site care 1. Follow instructions from your health care provider about how to take care of your insertion site. Make sure you: ? Wash your hands with soap and water before you change your bandage (dressing). If soap and water are not available, use hand sanitizer. ? Change your dressing as told by your health care provider. 2. Check your insertion site every day for signs of infection. Check for: ? Redness, swelling, or pain. ? Fluid or blood. ? Pus or a bad smell. ? Warmth. 3. Do not take baths, swim, or use a hot tub for 5 days. 4. You may shower 24-48 hours after the procedure. ? Remove the dressing and gently wash the site with plain soap and water. ? Pat the area dry with a clean towel. ? Do not rub the site. That could cause bleeding. 5. Do not apply powder or lotion to the site. Activity  1. For 24 hours after the procedure, or as directed by your health care provider: ? Do not flex or bend the affected arm. ? Do not push or pull heavy objects with the affected arm. ? Do not drive yourself home from the hospital or clinic. You may drive 24 hours after the procedure. ? Do not operate machinery or power tools. 2. Do not push, pull or lift anything that is heavier than 10 lb for 5 days. 3. Ask your health care provider when it is okay to: ? Return to work or  school. ? Resume usual physical activities or sports. ? Resume sexual activity. General instructions  If the catheter site starts to bleed, raise your arm and put firm pressure on the site. If the bleeding does not stop, get help right away. This is a medical emergency.  If you went home on the same day as your procedure, a responsible adult should be with you for the first 24 hours after you arrive home.  Keep all follow-up visits as told by your health care provider. This is important. Contact a health care provider if:  You have a fever.  You have redness, swelling, or yellow drainage around your insertion site. Get help right away if:  You have unusual pain at the radial site.  The catheter insertion area swells very fast.  The insertion area is bleeding, and the bleeding does not stop when you hold steady pressure on the area.  Your arm or hand becomes pale, cool, tingly, or numb. These symptoms may represent a serious problem that is an emergency. Do not wait to see if the symptoms will go away. Get medical help right away. Call your local emergency services (911 in the [place] Do not drive yourself to the hospital. Summary  After the procedure, it is common to have bruising and tenderness at the site.  Follow instructions from your health care provider about how to take care of your radial site wound. Check   the wound every day for signs of infection.  Do not push, pull or lift anything that is heavier than 10 lb for 5 days.  This information is not intended to replace advice given to you by your health care provider. Make sure you discuss any questions you have with your health care provider. Document Revised: 07/03/2017 Document Reviewed: 07/03/2017 Elsevier Patient Education  2020 [employer] 

## 2020-02-23 NOTE — H&P (Signed)
OV 02/18/2020 copied for documentation    Date:  02/23/2020   ID:  Tanya Gray, DOB 1946-02-01, MRN [id number]  PCP:  [name], DO   Date: 02/18/20 Last Office Visit: 01/15/2020  HPI  Tanya Gray is a 74 y.o. female who presents to the office with a chief complaint of " congestive heart failure management and abnormal stress test." Patient's past medical history and cardiovascular risk factors include: Hypertension, Hyperlipidemia, acute HFpEF/stage B/NYHA class II/III, former smoker.   Patient presents with one of her church members [name] at today's office visit to discuss her abnormal stress test results.  She provides verbal consent in regards to discussing her medical information in her presence.  Patient states that [name] [phone] lives in [place] has the patient's power of attorney.  Patient was originally referred to the office at the request of her primary care provider for evaluation and management of chest pain and lower extremity swelling.  Initial office visit patient was complaining of atypical chest discomfort and given her cardiovascular risk factors was recommended to undergo stress test.  Patient did undergo a Lexiscan as a 2-day protocol was found EKG changes in the inferolateral leads and myocardial perfusion was abnormal with reversible ischemia in the lateral distribution suggestive underlying disease in the LCx territory.  Since last office visit patient has not had any chest pain at rest or with effort related activities.  But she has worsening lower extremity swelling and shortness of breath with effort related activities most likely secondary to being noncompliant with medical therapy.  Patient is suppose to be on hydrochlorothiazide and was started on torsemide at last office visit.  However, patient never picked up his medications and comes today with worsening lower extremity swelling up to the knees.  She has had a DVT study in the past  which was negative for deep venous thrombosis.  Given her underlying diagnosis of heart failure with preserved EF patient is enrolled into remote patient monitoring her congestive heart failure.  However, patient fails to check her weight on a daily basis.  Denies prior history of coronary artery disease, myocardial infarction, congestive heart failure, deep venous thrombosis, pulmonary embolism, stroke, transient ischemic attack.  FUNCTIONAL STATUS: No structured exercise program or daily routine.   ALLERGIES: No Known Allergies  MEDICATION LIST PRIOR TO VISIT: Current Meds  Medication Sig  . acetaminophen (TYLENOL) 500 MG tablet Take 1,000 mg by mouth every 6 (six) hours as needed for mild pain or headache.  Marland Kitchen aspirin 81 MG tablet Take 81 mg by mouth daily.    . benazepril (LOTENSIN) 40 MG tablet TAKE 1 TABLET BY MOUTH ONCE DAILY IN THE MORNING (Patient taking differently: Take 40 mg by mouth every evening. )  . Calcium Carbonate-Vitamin D (CALCIUM-VITAMIN D) 600-125 MG-UNIT TABS Take 1 tablet by mouth daily.   . Cholecalciferol (VITAMIN D3) 50 MCG (2000 UT) TABS Take 2,000 Units by mouth daily.   . hydrochlorothiazide (HYDRODIURIL) 25 MG tablet TAKE 1 TABLET BY MOUTH ONCE DAILY IN THE MORNING (Patient taking differently: Take 25 mg by mouth daily. )  . nitroGLYCERIN (NITROSTAT) 0.4 MG SL tablet Place 1 tablet (0.4 mg total) under the tongue every 5 (five) minutes as needed for chest pain. If you require more than two tablets five minutes apart go to the nearest ER via EMS.  Marland Kitchen omeprazole (PRILOSEC) 20 MG capsule Take 1 capsule by mouth once daily in the morning (Patient taking differently: Take 20 mg by mouth  daily. )  . potassium chloride SA (KLOR-CON) 20 MEQ tablet Take 1 tablet (20 mEq total) by mouth daily.  . pravastatin (PRAVACHOL) 40 MG tablet Take 1 tablet by mouth once daily (Patient taking differently: Take 80 mg by mouth daily. )  . torsemide (DEMADEX) 5 MG tablet Take 1 tablet  (5 mg total) by mouth every morning. (Patient taking differently: Take 5 mg by mouth daily. )  . verapamil (CALAN) 120 MG tablet Take 1 tablet by mouth twice daily (Patient taking differently: Take 120 mg by mouth 2 (two) times daily. )     PAST MEDICAL HISTORY: Past Medical History:  Diagnosis Date  . AORTIC INSUFFICIENCY 08/03/2009  . Cardiomegaly 01/29/2007  . CHF (congestive heart failure) ([place])   . Diverticulosis of colon (without mention of hemorrhage) 08/08/2006  . ESOPHAGEAL STRICTURE 02/19/2008  . Heartburn 02/19/2008  . HYPERLIPIDEMIA 02/18/2008  . HYPERTENSION, BENIGN SYSTEMIC 08/08/2006  . MYALGIA 01/29/2007  . OSTEOARTHRITIS 12/21/2008  . PEPTIC ULCER DISEASE 12/21/2008  . RHINITIS, ALLERGIC 08/08/2006  . Unspecified asthma(493.90) 08/08/2006    PAST SURGICAL HISTORY: Past Surgical History:  Procedure Laterality Date  . ABDOMINAL HYSTERECTOMY    . ENDOTRACHEAL INTUBATION EMERGENT    . ESOPHAGOGASTRODUODENOSCOPY      FAMILY HISTORY: The patient family history includes Aneurysm in her sister; Diabetes in her mother; Hypertension in her mother; Lung disease in her mother.  SOCIAL HISTORY:  The patient  reports that she quit smoking about 16 years ago. Her smoking use included cigarettes. She has a 15.00 pack-year smoking history. She has never used smokeless tobacco. She reports current alcohol use of about 1.0 - 2.0 standard drink of alcohol per week. She reports that she does not use drugs.  REVIEW OF SYSTEMS: Review of Systems  Constitutional: Negative for chills and fever.  HENT: Negative for hoarse voice and nosebleeds.   Eyes: Negative for discharge, double vision and pain.  Cardiovascular: Positive for dyspnea on exertion and leg swelling. Negative for chest pain, near-syncope, orthopnea, palpitations, paroxysmal nocturnal dyspnea and syncope.  Respiratory: Positive for shortness of breath. Negative for hemoptysis.   Musculoskeletal: Negative for muscle cramps and  myalgias.  Gastrointestinal: Negative for abdominal pain, constipation, diarrhea, hematemesis, hematochezia, melena, nausea and vomiting.  Neurological: Negative for dizziness and light-headedness.   PHYSICAL EXAM: Vitals with BMI 02/23/2020 02/18/2020 01/15/2020  Height 5' 3"  5' 3"  5' 3"   Weight 150 lbs 147 lbs 144 lbs  BMI 26.58 09.73 53.29  Systolic 924 268 341  Diastolic 88 94 88  Pulse 69 74 80    CONSTITUTIONAL: Well-developed and well-nourished. No acute distress.  SKIN: Skin is warm and dry. No rash noted. No cyanosis. No pallor. No jaundice HEAD: Normocephalic and atraumatic.  EYES: No scleral icterus MOUTH/THROAT: Moist oral membranes.  NECK: No JVD present. No thyromegaly noted. No carotid bruits  LYMPHATIC: No visible cervical adenopathy.  CHEST Normal respiratory effort. No intercostal retractions  LUNGS: Clear to auscultation bilaterally.  No stridor. No wheezes. No rales.  CARDIOVASCULAR: Regular, positive D6-Q2, soft holosystolic murmur heard at the apex, soft systolic ejection murmur, no gallops or rubs. ABDOMINAL: No apparent ascites.  EXTREMITIES: 2+ bilateral pitting edema noted in the lower extremities up to the midshin level.  Pulses were difficult to palpate. HEMATOLOGIC: No significant bruising NEUROLOGIC: Oriented to person, place, and time. Nonfocal. Normal muscle tone.  PSYCHIATRIC: Normal mood and affect. Normal behavior. Cooperative  CARDIAC DATABASE: EKG: 01/04/2020: Normal sinus rhythm, 64 bpm, normal axis, poor R  wave progression, nonspecific T wave abnormality.  Echocardiogram: 01/08/2020: Normal LV systolic function with visual EF 65-70%. Left ventricle cavity is normal in size. Mild left ventricular hypertrophy. Normal global wall motion. Doppler evidence of grade II diastolic dysfunction, elevated LAP. Calculated EF 68%. Left atrial cavity is mildly dilated. Mild (Grade I) aortic regurgitation. Mild (Grade I) mitral regurgitation. Mild tricuspid  regurgitation. No evidence of pulmonary hypertension. RVSP measures 30 mmHg. Moderate pulmonic regurgitation. Trivial pericardial effusion with no hemodynamic compromise. No prior study for comparison.  Stress Testing: Lexiscan Tetrofosmin Stress Test 02/08/2020: 2 day protocol.  Nondiagnostic ECG stress due to pharmacologic stress with Lexiscan. Non specific ST segment change noted in stress in inferolateral leads.  Myocardial perfusion is abnormal.  There is a reversible moderate defect in the lateral and apical regions.  Overall LV systolic function is abnormal with regional wall motion abnormalities. Stress LV EF: 44% with hypokinesis in the same region.  No previous exam available for comparison. Intermediate risk study.   Heart Catheterization: None  Lower Extremity Arterial Duplex 01/08/2020:  No hemodynamically significant stenoses are identified in the bilateral lower extremity arterial system.  This exam reveals normal perfusion of the right lower extremity (ABI 1.01) and mildly decreased perfusion of the left lower extremity, noted at the post tibial artery level (ABI 0.95).   Lower Extremity Venous Duplex Bilateral 01/29/2020:  No evidence of deep vein thrombosis of the lower extremities with normal venous return.  Generalized tissue edema noted.   LABORATORY DATA: External Labs: Collected: 12/11/2019 Creatinine 0.8 mg/dL. eGFR: 85 mL/min per 1.73 m Lipid profile: Total cholesterol 216, triglycerides 41, HDL 63, LDL 145, non-HDL 153 TSH: 0.34 and free T4 1.1 ng/mL  CMP Latest Ref Rng & Units 02/23/2020 01/05/2020 05/29/2019  Glucose 70 - 99 mg/dL 84 89 90  BUN 8 - 23 mg/dL 9 17 13   Creatinine 0.44 - 1.00 mg/dL 1.08(H) 1.13(H) 1.09  Sodium 135 - 145 mmol/L 144 146(H) 142  Potassium 3.5 - 5.1 mmol/L 3.5 4.0 3.4(L)  Chloride 98 - 111 mmol/L 107 106 100  CO2 22 - 32 mmol/L 29 25 31   Calcium 8.9 - 10.3 mg/dL 9.3 9.7 10.2  Total Protein 6.0 - 8.3 g/dL - - -  Total  Bilirubin 0.2 - 1.2 mg/dL - - -  Alkaline Phos 39 - 117 U/L - - -  AST 0 - 37 U/L - - -  ALT 0 - 35 U/L - - -   Lipid Panel     Component Value Date/Time   CHOL 226 (H) 05/29/2019 1029   TRIG 58.0 05/29/2019 1029   HDL 85.00 05/29/2019 1029   CHOLHDL 3 05/29/2019 1029   VLDL 11.6 05/29/2019 1029   LDLCALC 130 (H) 05/29/2019 1029    No components found for: NTPROBNP Recent Labs    05/29/19 1029 01/05/20 1158  PROBNP 61.0 524*   No results for input(s): TSH in the last 8760 hours.  HEMOGLOBIN A1C Lab Results  Component Value Date   HGBA1C 5.8 07/21/2008   IMPRESSION:  No diagnosis found.   RECOMMENDATIONS: SHAYDEN GINGRICH is a 74 y.o. female whose past medical history and cardiac risk factors include: Hypertension, Hyperlipidemia, acute HFpEF/stage B/NYHA class III, former smoker.   Abnormal nuclear stress test:   Given her symptoms of atypical chest pain and multiple cardiovascular risk factors patient was recommended to undergo stress test at prior office visits.  Lexiscan noted reversible ischemia in the lateral wall suggestive of ischemia in the LCx  distribution.  EKG also noted inferolateral changes.  Patient recommended to undergo left heart catheterization with possible intervention given her history of chest discomfort stress test.  Patient agreeable.  The left heart catheterization procedure was explained to the patient in detail. The indication, alternatives, risks and benefits were reviewed. Complications including but not limited to bleeding, infection, acute kidney injury, blood transfusion, heart rhythm disturbances, contrast (dye) reaction, damage to the arteries or nerves in the legs or hands, cerebrovascular accident, myocardial infarction, need for emergent bypass surgery, blood clots in the legs, possible need for emergent blood transfusion, and rarely death were reviewed and discussed with the patient. The patient voices understanding and wishes to  proceed.   Sublingual nitroglycerin tablets to use as needed basis.  Medication profile discussed.  Acute heart failure with preserved EF, stage B, NYHA class III:  Since last office visit patient has effort related dyspnea and worsening lower extremity swelling.  Most likely secondary to being noncompliant with diuretic therapy.  Refilled hydrochlorothiazide and torsemide.  Torsemide was prescribed at the last office visit but she did not fill the prescription.  Blood work in 1 week to evaluate kidney function electrolytes since initiation of diuretic therapy.  Patient is enrolled into remote patient monitoring for congestive heart failure.    Medications reconciled.  We will initiate beta-blocker therapy once euvolemic.  Lower extremity swelling: Checked lower extremity duplex for chronic venous insufficiency versus DVT but it was unremarkable.   Benign essential hypertension:  Currently managed by primary team  Medications reconciled.  Low-salt diet recommended.  Patient is asked to keep a log of her blood pressures and to bring them at the next office visit.  Mixed hyperlipidemia: Currently on statin therapy.  Since last visit her pravastatin dose was increased to 56m po qhs. Managed per primary team.    FINAL MEDICATION LIST END OF ENCOUNTER: Meds ordered this encounter  Medications  . sodium chloride flush (NS) 0.9 % injection 3 mL  . sodium chloride flush (NS) 0.9 % injection 3 mL  . 0.9 %  sodium chloride infusion  . aspirin chewable tablet 81 mg  . FOLLOWED BY Linked Order Group   . 0.9% sodium chloride infusion   . 0.9% sodium chloride infusion    There are no discontinued medications.   Current Facility-Administered Medications:  .  0.9 %  sodium chloride infusion, 250 mL, Intravenous, PRN, Cabe Lashley J, MD .  [EXPIRED] 0.9% sodium chloride infusion, 3 mL/kg/hr, Intravenous, Continuous, Last Rate: 200.1 mL/hr at 02/23/20 1304, 3 mL/kg/hr at  02/23/20 1304 **FOLLOWED BY** 0.9% sodium chloride infusion, 1 mL/kg/hr, Intravenous, Continuous, Sherlyn Ebbert J, MD, Last Rate: 66.7 mL/hr at 02/23/20 1404, 1 mL/kg/hr at 02/23/20 1404 .  sodium chloride flush (NS) 0.9 % injection 3 mL, 3 mL, Intravenous, Q12H, Jhonnie Aliano J, MD .  sodium chloride flush (NS) 0.9 % injection 3 mL, 3 mL, Intravenous, PRN, Isaias Dowson J, MD  Orders Placed This Encounter  Procedures  . Basic metabolic panel  . Diet NPO time specified  . Informed Consent Details: Physician/Practitioner Attestation; Transcribe to consent form and obtain patient signature  . Confirm CBC and BMP (or CMP) results within 7 days for inpatient and 30 days for outpatient: Outpatients with severe anemia (hgb<10, CKD, severe thrombocytopenia plts<100) labs should be within 10 days. Only draw PT/INR on patients that are on Coumadin, Hgb<10, have liver disease (cirrhosis, liver CA, hepatitis, etc). Urine pregnancy test within hospital admission for inpatients of child bearing  age, for outpatients day of procedure.  . Confirm EKG performed within 30 days for cardiac procedures and 12 months for peripheral vascular procedures.  Place order for EKG if missing or not within timeframe.  . Verify aspirin and / or anti-platelet medication (Plavix, Effient, Brilinta) dose available for cardiac / peripheral vascular procedure day. IF ordered daily / once, adjust schedule to administer before procedure.  . Weigh patient  . Initiate Cath/PCI clinical path; encourage patient to watch CCTV video  . Clip R groin  . Clip R radial (no IV/bracelet R wrist)  . EKG 12-Lead  . Insert peripheral IV  . Insert 2nd peripheral IV site-Saline lock IV   There are no outpatient Patient Instructions on file for this admission.  --Continue cardiac medications as reconciled in final medication list. --No follow-ups on file. Or sooner if needed. --Continue follow-up with your primary care physician  regarding the management of your other chronic comorbid conditions.  Patient's questions and concerns were addressed to her satisfaction. She voices understanding of the instructions provided during this encounter.   This note was created using a voice recognition software as a result there may be grammatical errors inadvertently enclosed that do not reflect the nature of this encounter. Every attempt is made to correct such errors.  Total time spent: 50 minutes greater than 50% of time spent face-to-face discussing the management of congestive heart failure and reviewing the risk/benifit/ alternatives of left heart catheterization .  AddendumGeorgiann Hahn [phone]) who has the patient's DPOA called for an update. Called her back 5:30pm reviewed the indications of why left heart catheterization was recommended.  Reviewed the risks, benefits and alternatives with her over the phone discussed above.  She is agreeable with the plan of care.  She can be called on the day of the procedure for verbal consent if needed.   [name], [place], [facility]  Pager: [phone] Office: [phone]

## 2020-02-23 NOTE — Interval H&P Note (Signed)
History and Physical Interval Note:  02/23/2020 2:47 PM  Tanya Gray  has presented today for surgery, with the diagnosis of Positive stress test.  The various methods of treatment have been discussed with the patient and family. After consideration of risks, benefits and other options for treatment, the patient has consented to  Procedure(s): LEFT HEART CATH AND CORONARY ANGIOGRAPHY (N/A) as a surgical intervention.  The patient's history has been reviewed, patient examined, no change in status, stable for surgery.  I have reviewed the patient's chart and labs.  Questions were answered to the patient's satisfaction.    2016/2017 Appropriate Use Criteria for Coronary Revascularization Clinical Presentation: Diabetes Mellitus? Symptom Status? S/P CABG? Antianginal Therapy (# of long-acting drugs)? Results of Non-invasive testing? FFR/iFR results in all diseased vessels? Patient undergoing renal transplant? Patient undergoing percutaneous valve procedure (TAVR, MitraClip, Others)? Symptom Status:  Ischemic Symptoms  Non-invasive Testing:  Intermediate Risk  If no or indeterminate stress test, FFR/iFR results in all diseased vessels:  N/A  Diabetes Mellitus:  No  S/P CABG:  No  Antianginal therapy (number of long-acting drugs):  1  Patient undergoing renal transplant:  No  Patient undergoing percutaneous valve procedure:  No    newline 1 Vessel Disease PCI CABG  No proximal LAD involvement, No proximal left dominant LCX involvement M (6); Indication 2 M (4); Indication 2   Proximal left dominant LCX involvement A (7); Indication 5 A (7); Indication 5   Proximal LAD involvement A (7); Indication 5 A (7); Indication 5   newline 2 Vessel Disease  No proximal LAD involvement A (7); Indication 8 M (6); Indication 8   Proximal LAD involvement A (7); Indication 11 A (7); Indication 11   newline 3 Vessel Disease  Low disease complexity (e.g., focal stenoses, SYNTAX <=22) A (7);  Indication 17 A (8); Indication 17   Intermediate or high disease complexity (e.g., SYNTAX >=23) M (6); Indication 21 A (8); Indication 21   newline Left Main Disease  Isolated LMCA disease: ostial or midshaft A (7); Indication 24 A (9); Indication 24   Isolated LMCA disease: bifurcation involvement M (5); Indication 25 A (9); Indication 25   LMCA ostial or midshaft, concurrent low disease burden multivessel disease (e.g., 1-2 additional focal stenoses, SYNTAX <=22) A (7); Indication 26 A (9); Indication 26   LMCA ostial or midshaft, concurrent intermediate or high disease burden multivessel disease (e.g., 1-2 additional bifurcation stenoses, long stenoses, SYNTAX >=23) M (4); Indication 27 A (9); Indication 27   LMCA bifurcation involvement, concurrent low disease burden multivessel disease (e.g., 1-2 additional focal stenoses, SYNTAX <=22) M (5); Indication 28 A (9); Indication 28   LMCA bifurcation involvement, concurrent intermediate or high disease burden multivessel disease (e.g., 1-2 additional bifurcation stenoses, long stenoses, SYNTAX >=23) R (3); Indication 29 A (9); Indication 29      Marshal Schrecengost J Rose-Marie Hickling

## 2020-02-23 NOTE — Progress Notes (Signed)
Patient was given discharge instructions. She verbalized understanding. 

## 2020-02-24 ENCOUNTER — Encounter (HOSPITAL_COMMUNITY): Payer: Self-pay | Admitting: Cardiology

## 2020-02-24 ENCOUNTER — Ambulatory Visit: Payer: Medicare Other | Admitting: Cardiology

## 2020-02-24 MED FILL — Lidocaine HCl Local Preservative Free (PF) Inj 1%: INTRAMUSCULAR | Qty: 30 | Status: AC

## 2020-03-17 ENCOUNTER — Encounter: Payer: Self-pay | Admitting: Cardiology

## 2020-03-17 ENCOUNTER — Other Ambulatory Visit: Payer: Self-pay

## 2020-03-17 ENCOUNTER — Ambulatory Visit: Payer: Medicaid Other | Admitting: Cardiology

## 2020-03-17 VITALS — BP 118/69 | HR 73 | Ht 63.0 in | Wt 145.0 lb

## 2020-03-17 DIAGNOSIS — I10 Essential (primary) hypertension: Secondary | ICD-10-CM

## 2020-03-17 DIAGNOSIS — I2510 Atherosclerotic heart disease of native coronary artery without angina pectoris: Secondary | ICD-10-CM

## 2020-03-17 DIAGNOSIS — M7989 Other specified soft tissue disorders: Secondary | ICD-10-CM

## 2020-03-17 DIAGNOSIS — Z87891 Personal history of nicotine dependence: Secondary | ICD-10-CM

## 2020-03-17 DIAGNOSIS — E782 Mixed hyperlipidemia: Secondary | ICD-10-CM

## 2020-03-17 DIAGNOSIS — I5032 Chronic diastolic (congestive) heart failure: Secondary | ICD-10-CM

## 2020-03-17 MED ORDER — METOPROLOL SUCCINATE ER 50 MG PO TB24: 50.0000 mg | ORAL_TABLET | Freq: Every morning | ORAL | 0 refills | Status: DC

## 2020-03-17 MED ORDER — TORSEMIDE 10 MG PO TABS: 10.0000 mg | ORAL_TABLET | Freq: Every morning | ORAL | 0 refills | Status: DC

## 2020-03-17 NOTE — Progress Notes (Signed)
Date:  03/17/2020   ID:  Tanya Gray, DOB 05-06-1946, MRN [id number]  PCP:  [name], DO  Cardiologist:  [name], DO, [facility] (established care 01/04/2020)  Date: 03/17/20 Last Office Visit: 02/18/2020  Chief Complaint  Patient presents with  . heart failure    pt has no complaints today   . Follow-up    HPI  Tanya Gray is a 74 y.o. female who presents to the office with a chief complaint of " management of congestive heart failure and follow-up after recent heart catheterization." Patient's past medical history and cardiovascular risk factors include: Hypertension, Hyperlipidemia, chronic HFpEF/stage B/NYHA class II, coronary artery disease without angina pectoris, former smoker.   Patient presents with one of her church members [name] at today's office visit. She provides verbal consent in regards to discussing her medical information in her presence.  Patient states that [name] [phone] lives in [place] has the patient's power of attorney.  Patient was originally referred to the office at the request of her primary care provider for evaluation and management of chest pain and lower extremity swelling.  During evaluation of chest pain patient was noted to have an abnormal nuclear stress test and thereafter underwent left heart catheterization since last visit.  Patient is noted to have coronary artery disease with chronic CTO of the LCx with collaterals.  No intervention was performed during last left heart catheterization.  Today she denies any chest pain or shortness of breath at rest or with effort related activities.  She continues to have bilateral lower extremity swelling despite being on torsemide 5 mg p.o. daily. She has had a DVT study in the past which was negative for deep venous thrombosis.  Given her underlying diagnosis of heart failure with preserved EF patient is enrolled into remote patient monitoring her congestive heart failure.   However, patient fails to check her weight on a daily basis.  FUNCTIONAL STATUS: No structured exercise program or daily routine.   ALLERGIES: No Known Allergies  MEDICATION LIST PRIOR TO VISIT: Current Meds  Medication Sig  . acetaminophen (TYLENOL) 500 MG tablet Take 1,000 mg by mouth every 6 (six) hours as needed for mild pain or headache.  Marland Kitchen aspirin 81 MG tablet Take 81 mg by mouth daily.    . benazepril (LOTENSIN) 40 MG tablet TAKE 1 TABLET BY MOUTH ONCE DAILY IN THE MORNING  . Calcium Carbonate-Vitamin D (CALCIUM-VITAMIN D) 600-125 MG-UNIT TABS Take 1 tablet by mouth daily.   . Cholecalciferol (VITAMIN D3) 50 MCG (2000 UT) TABS Take 2,000 Units by mouth daily.   . hydrochlorothiazide (HYDRODIURIL) 25 MG tablet TAKE 1 TABLET BY MOUTH ONCE DAILY IN THE MORNING  . nitroGLYCERIN (NITROSTAT) 0.4 MG SL tablet Place 1 tablet (0.4 mg total) under the tongue every 5 (five) minutes as needed for chest pain. If you require more than two tablets five minutes apart go to the nearest ER via EMS.  Marland Kitchen omeprazole (PRILOSEC) 20 MG capsule Take 1 capsule by mouth once daily in the morning (Patient taking differently: Take 20 mg by mouth daily. )  . potassium chloride SA (KLOR-CON) 20 MEQ tablet Take 1 tablet (20 mEq total) by mouth daily.  . pravastatin (PRAVACHOL) 40 MG tablet Take 1 tablet by mouth once daily (Patient taking differently: Take 80 mg by mouth daily. )  . traMADol (ULTRAM) 50 MG tablet Take 1 tablet (50 mg total) by mouth every 6 (six) hours as needed. for pain (Patient taking differently:  Take 50 mg by mouth every 6 (six) hours as needed for moderate pain. for pain)  . [DISCONTINUED] torsemide (DEMADEX) 5 MG tablet Take 1 tablet (5 mg total) by mouth every morning.  . [DISCONTINUED] verapamil (CALAN) 120 MG tablet Take 1 tablet by mouth twice daily (Patient taking differently: Take 120 mg by mouth 2 (two) times daily. )     PAST MEDICAL HISTORY: Past Medical History:  Diagnosis Date   . AORTIC INSUFFICIENCY 08/03/2009  . Cardiomegaly 01/29/2007  . CHF (congestive heart failure) ([place])   . Diverticulosis of colon (without mention of hemorrhage) 08/08/2006  . ESOPHAGEAL STRICTURE 02/19/2008  . Heartburn 02/19/2008  . HYPERLIPIDEMIA 02/18/2008  . HYPERTENSION, BENIGN SYSTEMIC 08/08/2006  . MYALGIA 01/29/2007  . OSTEOARTHRITIS 12/21/2008  . PEPTIC ULCER DISEASE 12/21/2008  . RHINITIS, ALLERGIC 08/08/2006  . Unspecified asthma(493.90) 08/08/2006    PAST SURGICAL HISTORY: Past Surgical History:  Procedure Laterality Date  . ABDOMINAL HYSTERECTOMY    . ENDOTRACHEAL INTUBATION EMERGENT    . ESOPHAGOGASTRODUODENOSCOPY    . LEFT HEART CATH AND CORONARY ANGIOGRAPHY N/A 02/23/2020   Procedure: LEFT HEART CATH AND CORONARY ANGIOGRAPHY;  Surgeon: Nigel Mormon, MD;  Location: [facility];  Service: Cardiovascular;  Laterality: N/A;    FAMILY HISTORY: The patient family history includes Aneurysm in her sister; Diabetes in her mother; Hypertension in her mother; Lung disease in her mother.  SOCIAL HISTORY:  The patient  reports that she quit smoking about 16 years ago. Her smoking use included cigarettes. She has a 15.00 pack-year smoking history. She has never used smokeless tobacco. She reports current alcohol use of about 1.0 - 2.0 standard drink of alcohol per week. She reports that she does not use drugs.  REVIEW OF SYSTEMS: Review of Systems  Constitutional: Negative for chills and fever.  HENT: Negative for hoarse voice and nosebleeds.   Eyes: Negative for discharge, double vision and pain.  Cardiovascular: Positive for leg swelling. Negative for chest pain, dyspnea on exertion, near-syncope, orthopnea, palpitations, paroxysmal nocturnal dyspnea and syncope.  Respiratory: Negative for hemoptysis and shortness of breath.   Musculoskeletal: Negative for muscle cramps and myalgias.  Gastrointestinal: Negative for abdominal pain, constipation, diarrhea, hematemesis,  hematochezia, melena, nausea and vomiting.  Neurological: Negative for dizziness and light-headedness.   PHYSICAL EXAM: Vitals with BMI 03/17/2020 02/23/2020 02/23/2020  Height 5' 3"  - -  Weight - - -  BMI - - -  Systolic 169 450 388  Diastolic 69 74 79  Pulse 73 51 58    CONSTITUTIONAL: Well-developed and well-nourished. No acute distress.  SKIN: Skin is warm and dry. No rash noted. No cyanosis. No pallor. No jaundice HEAD: Normocephalic and atraumatic.  EYES: No scleral icterus MOUTH/THROAT: Moist oral membranes.  NECK: No JVD present. No thyromegaly noted. No carotid bruits  LYMPHATIC: No visible cervical adenopathy.  CHEST Normal respiratory effort. No intercostal retractions  LUNGS: Clear to auscultation bilaterally.  No stridor. No wheezes. No rales.  CARDIOVASCULAR: Regular, positive E2-C0, soft holosystolic murmur heard at the apex, soft systolic ejection murmur, no gallops or rubs. ABDOMINAL: No apparent ascites.  EXTREMITIES: 2+ bilateral pitting edema noted in the lower extremities up to the midshin level.  Pulses were difficult to palpate. HEMATOLOGIC: No significant bruising NEUROLOGIC: Oriented to person, place, and time. Nonfocal. Normal muscle tone.  PSYCHIATRIC: Normal mood and affect. Normal behavior. Cooperative  CARDIAC DATABASE: EKG: 01/04/2020: Normal sinus rhythm, 64 bpm, normal axis, poor R wave progression, nonspecific T wave abnormality.  Echocardiogram: 01/08/2020: Normal LV systolic function with visual EF 65-70%. Left ventricle cavity is normal in size. Mild left ventricular hypertrophy. Normal global wall motion. Doppler evidence of grade II diastolic dysfunction, elevated LAP. Calculated EF 68%. Left atrial cavity is mildly dilated. Mild (Grade I) aortic regurgitation. Mild (Grade I) mitral regurgitation. Mild tricuspid regurgitation. No evidence of pulmonary hypertension. RVSP measures 30 mmHg. Moderate pulmonic regurgitation. Trivial pericardial  effusion with no hemodynamic compromise. No prior study for comparison.  Stress Testing: Lexiscan Tetrofosmin Stress Test 02/08/2020: 2 day protocol.  Nondiagnostic ECG stress due to pharmacologic stress with Lexiscan. Non specific ST segment change noted in stress in inferolateral leads.  Myocardial perfusion is abnormal.  There is a reversible moderate defect in the lateral and apical regions.  Overall LV systolic function is abnormal with regional wall motion abnormalities. Stress LV EF: 44% with hypokinesis in the same region.  No previous exam available for comparison. Intermediate risk study.   Heart Catheterization: 02/23/2020: LM: Normal LAD: Medial calcification with minimal luminal irregularities. LCx: Flush CTO. calcified vessel. Grade 2 left-to-left and right-to-left collaterals from LAD and RCA RCA: Minimal luminal irregularities. LVEDP normal  Lower Extremity Arterial Duplex 01/08/2020:  No hemodynamically significant stenoses are identified in the bilateral lower extremity arterial system.  This exam reveals normal perfusion of the right lower extremity (ABI 1.01) and mildly decreased perfusion of the left lower extremity, noted at the post tibial artery level (ABI 0.95).   Lower Extremity Venous Duplex Bilateral 01/29/2020:  No evidence of deep vein thrombosis of the lower extremities with normal venous return.  Generalized tissue edema noted.   LABORATORY DATA: External Labs: Collected: 12/11/2019 Creatinine 0.8 mg/dL. eGFR: 85 mL/min per 1.73 m Lipid profile: Total cholesterol 216, triglycerides 41, HDL 63, LDL 145, non-HDL 153 TSH: 0.34 and free T4 1.1 ng/mL  CMP Latest Ref Rng & Units 02/23/2020 01/05/2020 05/29/2019  Glucose 70 - 99 mg/dL 84 89 90  BUN 8 - 23 mg/dL 9 17 13   Creatinine 0.44 - 1.00 mg/dL 1.08(H) 1.13(H) 1.09  Sodium 135 - 145 mmol/L 144 146(H) 142  Potassium 3.5 - 5.1 mmol/L 3.5 4.0 3.4(L)  Chloride 98 - 111 mmol/L 107 106 100  CO2 22 -  32 mmol/L 29 25 31   Calcium 8.9 - 10.3 mg/dL 9.3 9.7 10.2  Total Protein 6.0 - 8.3 g/dL - - -  Total Bilirubin 0.2 - 1.2 mg/dL - - -  Alkaline Phos 39 - 117 U/L - - -  AST 0 - 37 U/L - - -  ALT 0 - 35 U/L - - -   Lipid Panel     Component Value Date/Time   CHOL 226 (H) 05/29/2019 1029   TRIG 58.0 05/29/2019 1029   HDL 85.00 05/29/2019 1029   CHOLHDL 3 05/29/2019 1029   VLDL 11.6 05/29/2019 1029   LDLCALC 130 (H) 05/29/2019 1029    No components found for: NTPROBNP Recent Labs    05/29/19 1029 01/05/20 1158  PROBNP 61.0 524*   No results for input(s): TSH in the last 8760 hours.  HEMOGLOBIN A1C Lab Results  Component Value Date   HGBA1C 5.8 07/21/2008   IMPRESSION:    ICD-10-CM   1. Chronic heart failure with preserved ejection fraction (HFpEF) (HCC)  I50.32 torsemide (DEMADEX) 10 MG tablet    Pro b natriuretic peptide (BNP)  2. Atherosclerosis of native coronary artery of native heart without angina pectoris  I25.10 metoprolol succinate (TOPROL-XL) 50 MG 24 hr tablet  3.  Benign hypertension  U13 Basic metabolic panel    Magnesium  4. Mixed hyperlipidemia  E78.2   5. Former smoker  Z87.891   59. Leg swelling  K44.01 Basic metabolic panel    Magnesium     RECOMMENDATIONS: COOPER STAMP is a 74 y.o. female whose past medical history and cardiac risk factors include: Hypertension, Hyperlipidemia, acute HFpEF/stage B/NYHA class III, former smoker.   Established coronary disease within native heart, without angina pectoris:  Patient underwent left heart catheterization due to an abnormal stress test and she is noted to have CTO of LCx with collaterals present.  Normal LVEDP and normal LVEF.  No angina pectoris.  Continue current medical therapy.  Transition her from verapamil to Toprol-XL.  Educated her on the importance of improving her cardiovascular risk factors.  Chronic heart failure with preserved EF, stage B, NYHA class III:  Medications  reconciled.  Discontinue verapamil.  Start Toprol-XL 50 mg p.o. every morning.  Increase torsemide to 10 mg p.o. every morning.  Blood work in 1 week to evaluate kidney function electrolytes since initiation of diuretic therapy.  Patient is enrolled into remote patient monitoring for congestive heart failure.    Lower extremity swelling: Increase torsemide to 10 mg p.o. every morning.  Compression stockings recommended.  Benign essential hypertension: Stable  Office blood pressure very well controlled.  Currently managed by primary team  Medications reconciled.  Low-salt diet recommended.  Patient is asked to keep a log of her blood pressures and to bring them at the next office visit.  Mixed hyperlipidemia: Currently on statin therapy.    FINAL MEDICATION LIST END OF ENCOUNTER: Meds ordered this encounter  Medications  . torsemide (DEMADEX) 10 MG tablet    Sig: Take 1 tablet (10 mg total) by mouth in the morning.    Dispense:  90 tablet    Refill:  0  . metoprolol succinate (TOPROL-XL) 50 MG 24 hr tablet    Sig: Take 1 tablet (50 mg total) by mouth in the morning. Take with or immediately following a meal.    Dispense:  90 tablet    Refill:  0    Medications Discontinued During This Encounter  Medication Reason  . torsemide (DEMADEX) 5 MG tablet Dose change  . verapamil (CALAN) 120 MG tablet Change in therapy     Current Outpatient Medications:  .  acetaminophen (TYLENOL) 500 MG tablet, Take 1,000 mg by mouth every 6 (six) hours as needed for mild pain or headache., Disp: , Rfl:  .  aspirin 81 MG tablet, Take 81 mg by mouth daily.  , Disp: , Rfl:  .  benazepril (LOTENSIN) 40 MG tablet, TAKE 1 TABLET BY MOUTH ONCE DAILY IN THE MORNING, Disp: 90 tablet, Rfl: 0 .  Calcium Carbonate-Vitamin D (CALCIUM-VITAMIN D) 600-125 MG-UNIT TABS, Take 1 tablet by mouth daily. , Disp: , Rfl:  .  Cholecalciferol (VITAMIN D3) 50 MCG (2000 UT) TABS, Take 2,000 Units by mouth daily. ,  Disp: , Rfl:  .  hydrochlorothiazide (HYDRODIURIL) 25 MG tablet, TAKE 1 TABLET BY MOUTH ONCE DAILY IN THE MORNING, Disp: 90 tablet, Rfl: 0 .  nitroGLYCERIN (NITROSTAT) 0.4 MG SL tablet, Place 1 tablet (0.4 mg total) under the tongue every 5 (five) minutes as needed for chest pain. If you require more than two tablets five minutes apart go to the nearest ER via EMS., Disp: 30 tablet, Rfl: 0 .  omeprazole (PRILOSEC) 20 MG capsule, Take 1 capsule by mouth once daily  in the morning (Patient taking differently: Take 20 mg by mouth daily. ), Disp: 90 capsule, Rfl: 0 .  potassium chloride SA (KLOR-CON) 20 MEQ tablet, Take 1 tablet (20 mEq total) by mouth daily., Disp: 30 tablet, Rfl: 0 .  pravastatin (PRAVACHOL) 40 MG tablet, Take 1 tablet by mouth once daily (Patient taking differently: Take 80 mg by mouth daily. ), Disp: 90 tablet, Rfl: 0 .  traMADol (ULTRAM) 50 MG tablet, Take 1 tablet (50 mg total) by mouth every 6 (six) hours as needed. for pain (Patient taking differently: Take 50 mg by mouth every 6 (six) hours as needed for moderate pain. for pain), Disp: 90 tablet, Rfl: 0 .  metoprolol succinate (TOPROL-XL) 50 MG 24 hr tablet, Take 1 tablet (50 mg total) by mouth in the morning. Take with or immediately following a meal., Disp: 90 tablet, Rfl: 0 .  torsemide (DEMADEX) 10 MG tablet, Take 1 tablet (10 mg total) by mouth in the morning., Disp: 90 tablet, Rfl: 0  Orders Placed This Encounter  Procedures  . Basic metabolic panel  . Magnesium  . Pro b natriuretic peptide (BNP)   There are no Patient Instructions on file for this visit.  --Continue cardiac medications as reconciled in final medication list. --Return in about 3 months (around 06/17/2020) for Follow up, CAD, heart failure management.. Or sooner if needed. --Continue follow-up with your primary care physician regarding the management of your other chronic comorbid conditions.  Patient's questions and concerns were addressed to her  satisfaction. She voices understanding of the instructions provided during this encounter.   This note was created using a voice recognition software as a result there may be grammatical errors inadvertently enclosed that do not reflect the nature of this encounter. Every attempt is made to correct such errors.  Total time spent: 31 minutes greater than 50% of time spent face-to-face discussing the management of congestive heart failure and coronary artery disease with medication changes.  [name], [place], [facility]  Pager: [phone] Office: [phone]

## 2020-03-25 LAB — BASIC METABOLIC PANEL
BUN/Creatinine Ratio: 11 — ABNORMAL LOW (ref 12–28)
BUN: 11 mg/dL (ref 8–27)
CO2: 26 mmol/L (ref 20–29)
Calcium: 9.8 mg/dL (ref 8.7–10.3)
Chloride: 104 mmol/L (ref 96–106)
Creatinine, Ser: 0.98 mg/dL (ref 0.57–1.00)
GFR calc Af Amer: 67 mL/min/{1.73_m2} (ref >59)
GFR calc non Af Amer: 58 mL/min/{1.73_m2} — ABNORMAL LOW (ref >59)
Glucose: 86 mg/dL (ref 65–99)
Potassium: 4 mmol/L (ref 3.5–5.2)
Sodium: 143 mmol/L (ref 134–144)

## 2020-03-25 LAB — PRO B NATRIURETIC PEPTIDE: NT-Pro BNP: 251 pg/mL (ref 0–301)

## 2020-03-25 LAB — MAGNESIUM: Magnesium: 2 mg/dL (ref 1.6–2.3)

## 2020-03-28 NOTE — Progress Notes (Signed)
Left vm to cb.

## 2020-03-29 NOTE — Progress Notes (Signed)
Called patient, NA, LMAM

## 2020-03-30 NOTE — Progress Notes (Signed)
We have attempted several times to contact patient. No response and no call back from patient.

## 2020-04-26 ENCOUNTER — Other Ambulatory Visit: Payer: Self-pay | Admitting: Internal Medicine

## 2020-04-26 DIAGNOSIS — Z1231 Encounter for screening mammogram for malignant neoplasm of breast: Secondary | ICD-10-CM

## 2020-06-16 ENCOUNTER — Ambulatory Visit: Payer: Medicaid Other | Admitting: Cardiology

## 2020-06-16 ENCOUNTER — Other Ambulatory Visit: Payer: Self-pay

## 2020-06-16 ENCOUNTER — Encounter: Payer: Self-pay | Admitting: Cardiology

## 2020-06-16 VITALS — BP 155/96 | HR 75 | Resp 16 | Ht 63.0 in | Wt 153.0 lb

## 2020-06-16 DIAGNOSIS — I2510 Atherosclerotic heart disease of native coronary artery without angina pectoris: Secondary | ICD-10-CM

## 2020-06-16 DIAGNOSIS — M7989 Other specified soft tissue disorders: Secondary | ICD-10-CM

## 2020-06-16 DIAGNOSIS — I10 Essential (primary) hypertension: Secondary | ICD-10-CM

## 2020-06-16 DIAGNOSIS — E782 Mixed hyperlipidemia: Secondary | ICD-10-CM

## 2020-06-16 DIAGNOSIS — Z87891 Personal history of nicotine dependence: Secondary | ICD-10-CM

## 2020-06-16 DIAGNOSIS — I5032 Chronic diastolic (congestive) heart failure: Secondary | ICD-10-CM

## 2020-06-16 NOTE — Progress Notes (Signed)
Date:  06/16/2020   ID:  Tanya Gray, DOB 02/10/1946, MRN [id number]  PCP:  [name], DO  Cardiologist:  [name], DO, [facility] (established care 01/04/2020)  Date: 06/16/20 Last Office Visit: 03/17/2020  Chief Complaint  Patient presents with  . Coronary Artery Disease  . Chronic heart failure with preserved ejection fraction   . Follow-up    HPI  Tanya Gray is a 75 y.o. female who presents to the office with a chief complaint of " 33-monthfollow-up for CAD and heart failure management." Patient's past medical history and cardiovascular risk factors include: Hypertension, Hyperlipidemia, chronic HFpEF/stage B/NYHA class II, coronary artery disease without angina pectoris, former smoker.   Patient presents with one of her church members [name] at today's office visit. She provides verbal consent in regards to discussing her medical information in her presence.  Patient states that VGeorgiann [phone]  Patient was originally referred to the office at the request of her primary care provider for evaluation and management of chest pain and lower extremity swelling.  Patient had undergone nuclear stress test which was reported to be abnormal.  Underwent left heart catheterization and noted to have coronary artery disease with chronic CTO of the LCx with collaterals.  No intervention was performed and medical therapy was recommended.   With regards to management of chronic heart failure with preserved EF patient has not had any hospitalizations for heart failure exacerbation.  She was enrolled into principal care management for congestive heart failure.  However has stopped checking her daily weights for couple months.  Patient is encouraged to restart this to help prevent hospitalizations.  Medications reconciled.  I suspect that she has not been taking some of her medications specifically benazepril and  hydrochlorothiazide as they were not present in the medication bottles that she brings in at today's visit.  I have asked her and her friend from church to make sure that she does have the medications at home and to start taking them as prescribed.   With regards to coronary artery disease patient is currently being treated medically.  Over the last 3 months she has had 2 episodes of chest pain.  During both of these events she is taken 1 tablet of sublingual nitroglycerin which resolved her discomfort.  She denies any active chest pain at rest or with effort related activities.   When I spoke to her power of attorney prior to scheduling her for left heart catheterization there is concern for underlying cognitive impairment/dementia.  I offered her to call her pharmacy and have them pack her daily medications in a pill pack form but she refuses this service at the current time.   FUNCTIONAL STATUS: No structured exercise program or daily routine.   ALLERGIES: No Known Allergies  MEDICATION LIST PRIOR TO VISIT: Current Meds  Medication Sig  . acetaminophen (TYLENOL) 500 MG tablet Take 1,000 mg by mouth every 6 (six) hours as needed for mild pain or headache.  .Marland Kitchenaspirin 81 MG tablet Take 81 mg by mouth daily.  . benazepril (LOTENSIN) 40 MG tablet TAKE 1 TABLET BY MOUTH ONCE DAILY IN THE MORNING  . Calcium Carbonate-Vitamin D (CALCIUM-VITAMIN D) 600-125 MG-UNIT TABS Take 1 tablet by mouth daily.  . Cholecalciferol (VITAMIN D3) 50 MCG (2000 UT) TABS Take 2,000 Units by mouth daily.   . metoprolol succinate (TOPROL-XL) 50 MG 24 hr tablet Take 1 tablet (50 mg total) by  mouth in the morning. Take with or immediately following a meal.  . nitroGLYCERIN (NITROSTAT) 0.4 MG SL tablet Place 1 tablet (0.4 mg total) under the tongue every 5 (five) minutes as needed for chest pain. If you require more than two tablets five minutes apart go to the nearest ER via EMS.  . potassium chloride SA (KLOR-CON) 20 MEQ  tablet Take 1 tablet (20 mEq total) by mouth daily.  . pravastatin (PRAVACHOL) 40 MG tablet Take 1 tablet by mouth once daily (Patient taking differently: Take 80 mg by mouth daily.)  . torsemide (DEMADEX) 10 MG tablet Take 1 tablet (10 mg total) by mouth in the morning.     PAST MEDICAL HISTORY: Past Medical History:  Diagnosis Date  . AORTIC INSUFFICIENCY 08/03/2009  . Cardiomegaly 01/29/2007  . CHF (congestive heart failure) (HCC)   . Coronary artery disease   . Diverticulosis of colon (without mention of hemorrhage) 08/08/2006  . ESOPHAGEAL STRICTURE 02/19/2008  . Heartburn 02/19/2008  . HYPERLIPIDEMIA 02/18/2008  . HYPERTENSION, BENIGN SYSTEMIC 08/08/2006  . MYALGIA 01/29/2007  . OSTEOARTHRITIS 12/21/2008  . PEPTIC ULCER DISEASE 12/21/2008  . RHINITIS, ALLERGIC 08/08/2006  . Unspecified asthma(493.90) 08/08/2006    PAST SURGICAL HISTORY: Past Surgical History:  Procedure Laterality Date  . ABDOMINAL HYSTERECTOMY    . CARDIAC CATHETERIZATION    . ENDOTRACHEAL INTUBATION EMERGENT    . ESOPHAGOGASTRODUODENOSCOPY    . LEFT HEART CATH AND CORONARY ANGIOGRAPHY N/A 02/23/2020   Procedure: LEFT HEART CATH AND CORONARY ANGIOGRAPHY;  Surgeon: Patwardhan, Manish J, MD;  Location: [facility] INVASIVE CV LAB;  Service: Cardiovascular;  Laterality: N/A;    FAMILY HISTORY: The patient family history includes Aneurysm in her sister; Diabetes in her mother; Hypertension in her mother; Lung disease in her mother.  SOCIAL HISTORY:  The patient  reports that she quit smoking about 17 years ago. Her smoking use included cigarettes. She has a 15.00 pack-year smoking history. She has never used smokeless tobacco. She reports current alcohol use of about 1.0 - 2.0 standard drink of alcohol per week. She reports that she does not use drugs.  REVIEW OF SYSTEMS: Review of Systems  Constitutional: Negative for chills and fever.  HENT: Negative for hoarse voice and nosebleeds.   Eyes: Negative for discharge, double  vision and pain.  Cardiovascular: Negative for chest pain, dyspnea on exertion, leg swelling, near-syncope, orthopnea, palpitations, paroxysmal nocturnal dyspnea and syncope.  Respiratory: Negative for hemoptysis and shortness of breath.   Musculoskeletal: Negative for muscle cramps and myalgias.  Gastrointestinal: Negative for abdominal pain, constipation, diarrhea, hematemesis, hematochezia, melena, nausea and vomiting.  Neurological: Negative for dizziness and light-headedness.   PHYSICAL EXAM: Vitals with BMI 06/16/2020 06/16/2020 03/17/2020  Height - 5' 3" 5' 3"  Weight - 153 lbs 145 lbs  BMI - 27.11 25.69  Systolic 155 154 118  Diastolic 96 96 69  Pulse 75 78 73    CONSTITUTIONAL: Well-developed and well-nourished. No acute distress.  SKIN: Skin is warm and dry. No rash noted. No cyanosis. No pallor. No jaundice HEAD: Normocephalic and atraumatic.  EYES: No scleral icterus MOUTH/THROAT: Moist oral membranes.  NECK: No JVD present. No thyromegaly noted. No carotid bruits  LYMPHATIC: No visible cervical adenopathy.  CHEST Normal respiratory effort. No intercostal retractions  LUNGS: Clear to auscultation bilaterally.  No stridor. No wheezes. No rales.  CARDIOVASCULAR: Regular, positive S1-S2, soft holosystolic murmur heard at the apex, soft systolic ejection murmur, no gallops or rubs. ABDOMINAL: No apparent ascites.  EXTREMITIES:   1+ bilateral pitting edema noted in the lower extremities up to the midshin level.  Pulses were difficult to palpate. HEMATOLOGIC: No significant bruising NEUROLOGIC: Oriented to person, place, and time. Nonfocal. Normal muscle tone.  PSYCHIATRIC: Normal mood and affect. Normal behavior. Cooperative  CARDIAC DATABASE: EKG: 01/04/2020: Normal sinus rhythm, 64 bpm, normal axis, poor R wave progression, nonspecific T wave abnormality.  Echocardiogram: 01/08/2020: Normal LV systolic function with visual EF 65-70%. Left ventricle cavity is normal in size.  Mild left ventricular hypertrophy. Normal global wall motion. Doppler evidence of grade II diastolic dysfunction, elevated LAP. Calculated EF 68%. Left atrial cavity is mildly dilated. Mild (Grade I) aortic regurgitation. Mild (Grade I) mitral regurgitation. Mild tricuspid regurgitation. No evidence of pulmonary hypertension. RVSP measures 30 mmHg. Moderate pulmonic regurgitation. Trivial pericardial effusion with no hemodynamic compromise. No prior study for comparison.  Stress Testing: Lexiscan Tetrofosmin Stress Test 02/08/2020: 2 day protocol.  Nondiagnostic ECG stress due to pharmacologic stress with Lexiscan. Non specific ST segment change noted in stress in inferolateral leads.  Myocardial perfusion is abnormal.  There is a reversible moderate defect in the lateral and apical regions.  Overall LV systolic function is abnormal with regional wall motion abnormalities. Stress LV EF: 44% with hypokinesis in the same region.  No previous exam available for comparison. Intermediate risk study.   Heart Catheterization: 02/23/2020: LM: Normal LAD: Medial calcification with minimal luminal irregularities. LCx: Flush CTO. calcified vessel. Grade 2 left-to-left and right-to-left collaterals from LAD and RCA RCA: Minimal luminal irregularities. LVEDP normal  Lower Extremity Arterial Duplex 01/08/2020:  No hemodynamically significant stenoses are identified in the bilateral lower extremity arterial system.  This exam reveals normal perfusion of the right lower extremity (ABI 1.01) and mildly decreased perfusion of the left lower extremity, noted at the post tibial artery level (ABI 0.95).   Lower Extremity Venous Duplex Bilateral 01/29/2020:  No evidence of deep vein thrombosis of the lower extremities with normal venous return.  Generalized tissue edema noted.   LABORATORY DATA: External Labs: Collected: 12/11/2019 Creatinine 0.8 mg/dL. eGFR: 85 mL/min per 1.73 m Lipid profile:  Total cholesterol 216, triglycerides 41, HDL 63, LDL 145, non-HDL 153 TSH: 0.34 and free T4 1.1 ng/mL  CMP Latest Ref Rng & Units 03/24/2020 02/23/2020 01/05/2020  Glucose 65 - 99 mg/dL 86 84 89  BUN 8 - 27 mg/dL 11 9 17  Creatinine 0.57 - 1.00 mg/dL 0.98 1.08(H) 1.13(H)  Sodium 134 - 144 mmol/L 143 144 146(H)  Potassium 3.5 - 5.2 mmol/L 4.0 3.5 4.0  Chloride 96 - 106 mmol/L 104 107 106  CO2 20 - 29 mmol/L 26 29 25  Calcium 8.7 - 10.3 mg/dL 9.8 9.3 9.7  Total Protein 6.0 - 8.3 g/dL - - -  Total Bilirubin 0.2 - 1.2 mg/dL - - -  Alkaline Phos 39 - 117 U/L - - -  AST 0 - 37 U/L - - -  ALT 0 - 35 U/L - - -   Lipid Panel     Component Value Date/Time   CHOL 226 (H) 05/29/2019 1029   TRIG 58.0 05/29/2019 1029   HDL 85.00 05/29/2019 1029   CHOLHDL 3 05/29/2019 1029   VLDL 11.6 05/29/2019 1029   LDLCALC 130 (H) 05/29/2019 1029    No components found for: NTPROBNP Recent Labs    01/05/20 1158 03/24/20 1137  PROBNP 524* 251   No results for input(s): TSH in the last 8760 hours.  HEMOGLOBIN A1C Lab Results  Component Value   Date   HGBA1C 5.8 07/21/2008   IMPRESSION:    ICD-10-CM   1. Chronic heart failure with preserved ejection fraction (HFpEF) (HCC)  J81.19 Basic metabolic panel    Magnesium    Pro b natriuretic peptide (BNP)    Pro b natriuretic peptide (BNP)    Magnesium    Basic metabolic panel  2. Atherosclerosis of native coronary artery of native heart without angina pectoris  I25.10   3. Benign hypertension  I10   4. Mixed hyperlipidemia  E78.2   5. Former smoker  Z87.891   62. Leg swelling  M79.89      RECOMMENDATIONS: Tanya Gray is a 75 y.o. female whose past medical history and cardiac risk factors include: Hypertension, Hyperlipidemia, acute HFpEF/stage B/NYHA class III, former smoker.   Established coronary disease within native heart, without angina pectoris:  Patient underwent left heart catheterization due to an abnormal stress test and she  is noted to have CTO of LCx with collaterals present.  Normal LVEDP and normal LVEF.  Medical management recommended.  No active angina pectoris.  She has had 2 episodes of chest pain which are relieved by sublingual nitroglycerin tablets.  Continue current medical therapy.  Educated her on the importance of improving her cardiovascular risk factors.  Chronic heart failure with preserved EF, stage B, NYHA class III:  Medications reconciled.  Based on the medication bottles that she brought in at today's visit she has not been taking her benazepril and hydrochlorothiazide.  This probably accounts for her blood pressure not being well controlled.  Patient is asked to start taking benazepril at night and hydrochlorothiazide in the morning.  She is requested to keep a log of her blood pressures and to bring it in at the next office visit.  Check labs before the next office visit to evaluate kidney function and electrolytes and BNP.  She has gained 8 pounds in the last office visit.  She is enrolled into principal care management for congestive heart failure but has failed to check her weights on a daily basis since November 2021.  She is very encouraged to start doing so and educated on its importance.  I offered her to call her pharmacy and have them pack her daily medications in a pill pack form but she refuses this service at the current time.   Lower extremity swelling: Continue diuretic therapy.  She uses compression stockings, as needed basis.  Benign essential hypertension:   Office blood pressure not well controlled.  I assume that she is not seeing him benazepril and hydrochlorothiazide as they were not prominent at today's office visit along with her other medications.  Patient is asked to resume these medications were called the office for refill.  Currently managed by primary team  Medications reconciled.  Low-salt diet recommended.  Patient is asked to keep a log of her  blood pressures and to bring them at the next office visit.  Mixed hyperlipidemia: Currently on statin therapy.  Currently managed by primary care provider.  FINAL MEDICATION LIST END OF ENCOUNTER: No orders of the defined types were placed in this encounter.   Medications Discontinued During This Encounter  Medication Reason  . omeprazole (PRILOSEC) 20 MG capsule Patient Preference  . traMADol (ULTRAM) 50 MG tablet Patient Preference     Current Outpatient Medications:  .  acetaminophen (TYLENOL) 500 MG tablet, Take 1,000 mg by mouth every 6 (six) hours as needed for mild pain or headache., Disp: , Rfl:  .  aspirin 81 MG tablet, Take 81 mg by mouth daily., Disp: , Rfl:  .  benazepril (LOTENSIN) 40 MG tablet, TAKE 1 TABLET BY MOUTH ONCE DAILY IN THE MORNING, Disp: 90 tablet, Rfl: 0 .  Calcium Carbonate-Vitamin D (CALCIUM-VITAMIN D) 600-125 MG-UNIT TABS, Take 1 tablet by mouth daily., Disp: , Rfl:  .  Cholecalciferol (VITAMIN D3) 50 MCG (2000 UT) TABS, Take 2,000 Units by mouth daily. , Disp: , Rfl:  .  metoprolol succinate (TOPROL-XL) 50 MG 24 hr tablet, Take 1 tablet (50 mg total) by mouth in the morning. Take with or immediately following a meal., Disp: 90 tablet, Rfl: 0 .  nitroGLYCERIN (NITROSTAT) 0.4 MG SL tablet, Place 1 tablet (0.4 mg total) under the tongue every 5 (five) minutes as needed for chest pain. If you require more than two tablets five minutes apart go to the nearest ER via EMS., Disp: 30 tablet, Rfl: 0 .  potassium chloride SA (KLOR-CON) 20 MEQ tablet, Take 1 tablet (20 mEq total) by mouth daily., Disp: 30 tablet, Rfl: 0 .  pravastatin (PRAVACHOL) 40 MG tablet, Take 1 tablet by mouth once daily (Patient taking differently: Take 80 mg by mouth daily.), Disp: 90 tablet, Rfl: 0 .  torsemide (DEMADEX) 10 MG tablet, Take 1 tablet (10 mg total) by mouth in the morning., Disp: 90 tablet, Rfl: 0 .  hydrochlorothiazide (HYDRODIURIL) 25 MG tablet, TAKE 1 TABLET BY MOUTH ONCE DAILY  IN THE MORNING, Disp: 90 tablet, Rfl: 0  Orders Placed This Encounter  Procedures  . Basic metabolic panel  . Magnesium  . Pro b natriuretic peptide (BNP)   There are no Patient Instructions on file for this visit.  --Continue cardiac medications as reconciled in final medication list. --Return in about 1 month (around 07/18/2020) for Follow up, heart failure management.. Or sooner if needed. --Continue follow-up with your primary care physician regarding the management of your other chronic comorbid conditions.  Patient's questions and concerns were addressed to her satisfaction. She voices understanding of the instructions provided during this encounter.   This note was created using a voice recognition software as a result there may be grammatical errors inadvertently enclosed that do not reflect the nature of this encounter. Every attempt is made to correct such errors.  Total time spent: 33 minutes greater than 50% of time spent face-to-face discussing the management of congestive heart failure and coronary artery disease with medication changes.  [name], [place], [facility]  Pager: [phone] Office: [phone]

## 2020-06-23 ENCOUNTER — Ambulatory Visit: Payer: Medicare Other

## 2020-07-12 LAB — BASIC METABOLIC PANEL
BUN/Creatinine Ratio: 9 — ABNORMAL LOW (ref 12–28)
BUN: 11 mg/dL (ref 8–27)
CO2: 28 mmol/L (ref 20–29)
Calcium: 9.5 mg/dL (ref 8.7–10.3)
Chloride: 101 mmol/L (ref 96–106)
Creatinine, Ser: 1.21 mg/dL — ABNORMAL HIGH (ref 0.57–1.00)
GFR calc Af Amer: 52 mL/min/{1.73_m2} — ABNORMAL LOW (ref >59)
GFR calc non Af Amer: 45 mL/min/{1.73_m2} — ABNORMAL LOW (ref >59)
Glucose: 95 mg/dL (ref 65–99)
Potassium: 5 mmol/L (ref 3.5–5.2)
Sodium: 145 mmol/L — ABNORMAL HIGH (ref 134–144)

## 2020-07-12 LAB — PRO B NATRIURETIC PEPTIDE: NT-Pro BNP: 853 pg/mL — ABNORMAL HIGH (ref 0–301)

## 2020-07-12 LAB — MAGNESIUM: Magnesium: 2 mg/dL (ref 1.6–2.3)

## 2020-07-13 ENCOUNTER — Telehealth: Payer: Self-pay

## 2020-07-13 ENCOUNTER — Other Ambulatory Visit: Payer: Self-pay | Admitting: Cardiology

## 2020-07-13 DIAGNOSIS — I2510 Atherosclerotic heart disease of native coronary artery without angina pectoris: Secondary | ICD-10-CM

## 2020-07-13 DIAGNOSIS — I5032 Chronic diastolic (congestive) heart failure: Secondary | ICD-10-CM

## 2020-07-13 NOTE — Progress Notes (Signed)
Pt still continues to be non-compliant to daily weight checks. Called and left multiple VM for pt to call back and continue with her daily wt monitoring. Called pt's friend (Tanya Gray) to see if pt could come back to the office to review medication compliance and optimize her heart failure management. Tanya Gray stated that pt hasnt mentioned any concerns whenever they talked, but did note that she did have mild lower extremity edema two days ago while they went to get their blood work done. Per friend, pt states that she has been complaint with her medications and has been staying hydrated. Pt has an OV w/ PCP tomorrow and was already told to bring in her medications to review her medication compliance during the visit. Will request the PCP office to fax over the clinic notes after the visit. Requested [name] to review the lab results with the pt and to call the office back to review her current symptoms and medication management.

## 2020-07-18 ENCOUNTER — Other Ambulatory Visit: Payer: Self-pay

## 2020-07-18 ENCOUNTER — Other Ambulatory Visit: Payer: Self-pay | Admitting: Pharmacist

## 2020-07-18 ENCOUNTER — Encounter: Payer: Self-pay | Admitting: Cardiology

## 2020-07-18 ENCOUNTER — Ambulatory Visit: Payer: Medicaid Other | Admitting: Cardiology

## 2020-07-18 VITALS — BP 145/84 | HR 66 | Temp 97.1°F | Resp 16 | Ht 63.0 in | Wt 142.0 lb

## 2020-07-18 DIAGNOSIS — E782 Mixed hyperlipidemia: Secondary | ICD-10-CM

## 2020-07-18 DIAGNOSIS — I10 Essential (primary) hypertension: Secondary | ICD-10-CM

## 2020-07-18 DIAGNOSIS — I5031 Acute diastolic (congestive) heart failure: Secondary | ICD-10-CM

## 2020-07-18 DIAGNOSIS — M7989 Other specified soft tissue disorders: Secondary | ICD-10-CM

## 2020-07-18 DIAGNOSIS — I2510 Atherosclerotic heart disease of native coronary artery without angina pectoris: Secondary | ICD-10-CM

## 2020-07-18 DIAGNOSIS — Z87891 Personal history of nicotine dependence: Secondary | ICD-10-CM

## 2020-07-18 DIAGNOSIS — I5032 Chronic diastolic (congestive) heart failure: Secondary | ICD-10-CM

## 2020-07-18 DIAGNOSIS — R9439 Abnormal result of other cardiovascular function study: Secondary | ICD-10-CM

## 2020-07-18 DIAGNOSIS — Z712 Person consulting for explanation of examination or test findings: Secondary | ICD-10-CM

## 2020-07-18 MED ORDER — ASPIRIN EC 81 MG PO TBEC
81.0000 mg | DELAYED_RELEASE_TABLET | Freq: Every day | ORAL | 3 refills | Status: DC
Start: 2020-07-18 — End: 2020-11-23

## 2020-07-18 NOTE — Telephone Encounter (Signed)
Pt would benefit from medication management assistance and pt is interested in enrolling for a medication blister pack program through [facility]. Called [facility] to help enroll pt into the program. Requested pt's Rx to be sent over in order to initiate transfer process.

## 2020-07-18 NOTE — Progress Notes (Signed)
Date:  07/18/2020   ID:  Tanya Gray, DOB August 14, 1945, MRN [id number]  PCP:  [name], DO  Cardiologist:  [name], DO, [facility] (established care 01/04/2020)  Date: 07/18/20 Last Office Visit: 06/16/2020  Chief Complaint  Patient presents with  . Congestive Heart Failure  . Follow-up    1 month    HPI  Tanya Gray is a 75 y.o. female who presents to the office with a chief complaint of " 72-monthfollow-up for CAD and heart failure management." Patient's past medical history and cardiovascular risk factors include: Hypertension, Hyperlipidemia, chronic HFpEF/stage B/NYHA class II, coronary artery disease without angina pectoris, former smoker.   Patient presents with one of her church members Tanya Gray at today's office visit. She provides verbal consent in regards to discussing her medical information in her presence.  Patient states that Tanya [id number]  Patient was originally referred to the office at the request of her primary care provider for evaluation and management of chest pain and lower extremity swelling.  Patient had undergone nuclear stress test which was reported to be abnormal.  Underwent left heart catheterization and noted to have coronary artery disease with chronic CTO of the LCx with collaterals.  No intervention was performed and medical therapy was recommended.   Since last office visit patient has not had any chest pain and did not require the use of sublingual nitroglycerin tablets.  She denies heart failure symptoms. Her medications were titrated at the last office visit that she was complaining of lower extremity swelling, and had 8 pounds of weight gain compared to her prior visit.  She had blood work on 07/11/2020 which notes elevated serum creatinine level as well as NT proBNP.  Patient has multiple bottles of the same medication, as few medications that her currently missing  despite being prescribed multiple times, patient states that she is having trouble remembering things and is concerned that she may have underlying dementia.  I did inform the patient that my concern for dementia/cognitive impairment was present since prior office visits and this was also voiced by her power of attorney prior to consenting her for left heart catheterization.  I have asked her to follow-up with her PCP regarding additional work-up.  To improve medication compliance so that she is not hospitalized for congestive heart failure and also discussed pill packs with her in the past but she was refusing it.  However, at today's office visit she would like to try it.  FUNCTIONAL STATUS: No structured exercise program or daily routine.   ALLERGIES: No Known Allergies  MEDICATION LIST PRIOR TO VISIT: Current Meds  Medication Sig  . acetaminophen (TYLENOL) 500 MG tablet Take 1,000 mg by mouth every 6 (six) hours as needed for mild pain or headache.  . benazepril (LOTENSIN) 40 MG tablet TAKE 1 TABLET BY MOUTH ONCE DAILY IN THE MORNING  . Calcium Carbonate-Vitamin D (CALCIUM-VITAMIN D) 600-125 MG-UNIT TABS Take 1 tablet by mouth daily.  . Cholecalciferol (VITAMIN D3) 50 MCG (2000 UT) TABS Take 2,000 Units by mouth daily.   . hydrochlorothiazide (HYDRODIURIL) 25 MG tablet TAKE 1 TABLET BY MOUTH ONCE DAILY IN THE MORNING  . metoprolol succinate (TOPROL-XL) 50 MG 24 hr tablet TAKE ONE TABLET BY MOUTH IN THE MORNING. TAKE WITH OR IMMEDIATELY FOLLOWING A MEAL  . nitroGLYCERIN (NITROSTAT) 0.4 MG SL tablet Place 1 tablet (0.4 mg total) under the tongue every 5 (five) minutes as  needed for chest pain. If you require more than two tablets five minutes apart go to the nearest ER via EMS.  Marland Kitchen omeprazole (PRILOSEC) 20 MG capsule Take 1 capsule by mouth daily as needed.  . potassium chloride SA (KLOR-CON) 20 MEQ tablet Take 1 tablet (20 mEq total) by mouth daily.  . pravastatin (PRAVACHOL) 80 MG tablet  Take 80 mg by mouth at bedtime.  . torsemide (DEMADEX) 10 MG tablet TAKE 1 TABLET BY MOUTH IN THE MORNING (Patient taking differently: Take 10 mg by mouth daily as needed.)  . traMADol (ULTRAM) 50 MG tablet 1 tablet daily as needed.  . [DISCONTINUED] aspirin 81 MG tablet Take 81 mg by mouth daily.  . [DISCONTINUED] pravastatin (PRAVACHOL) 40 MG tablet Take 1 tablet by mouth once daily (Patient taking differently: No sig reported)  . aspirin EC 81 MG tablet Take 1 tablet (81 mg total) by mouth daily for 360 doses. Swallow whole.     PAST MEDICAL HISTORY: Past Medical History:  Diagnosis Date  . AORTIC INSUFFICIENCY 08/03/2009  . Cardiomegaly 01/29/2007  . CHF (congestive heart failure) ([place])   . Coronary artery disease   . Diverticulosis of colon (without mention of hemorrhage) 08/08/2006  . ESOPHAGEAL STRICTURE 02/19/2008  . Heartburn 02/19/2008  . HYPERLIPIDEMIA 02/18/2008  . HYPERTENSION, BENIGN SYSTEMIC 08/08/2006  . MYALGIA 01/29/2007  . OSTEOARTHRITIS 12/21/2008  . PEPTIC ULCER DISEASE 12/21/2008  . RHINITIS, ALLERGIC 08/08/2006  . Unspecified asthma(493.90) 08/08/2006    PAST SURGICAL HISTORY: Past Surgical History:  Procedure Laterality Date  . ABDOMINAL HYSTERECTOMY    . CARDIAC CATHETERIZATION    . ENDOTRACHEAL INTUBATION EMERGENT    . ESOPHAGOGASTRODUODENOSCOPY    . LEFT HEART CATH AND CORONARY ANGIOGRAPHY N/A 02/23/2020   Procedure: LEFT HEART CATH AND CORONARY ANGIOGRAPHY;  Surgeon: Nigel Mormon, MD;  Location: [facility];  Service: Cardiovascular;  Laterality: N/A;    FAMILY HISTORY: The patient family history includes Aneurysm in her sister; Diabetes in her mother; Hypertension in her mother; Lung disease in her mother.  SOCIAL HISTORY:  The patient  reports that she quit smoking about 17 years ago. Her smoking use included cigarettes. She has a 15.00 pack-year smoking history. She has never used smokeless tobacco. She reports current alcohol use of about 1.0  - 2.0 standard drink of alcohol per week. She reports that she does not use drugs.  REVIEW OF SYSTEMS: Review of Systems  Constitutional: Negative for chills and fever.  HENT: Negative for hoarse voice and nosebleeds.   Eyes: Negative for discharge, double vision and pain.  Cardiovascular: Negative for chest pain, dyspnea on exertion, leg swelling, near-syncope, orthopnea, palpitations, paroxysmal nocturnal dyspnea and syncope.  Respiratory: Negative for hemoptysis and shortness of breath.   Musculoskeletal: Negative for muscle cramps and myalgias.  Gastrointestinal: Negative for abdominal pain, constipation, diarrhea, hematemesis, hematochezia, melena, nausea and vomiting.  Neurological: Negative for dizziness and light-headedness.   PHYSICAL EXAM: Vitals with BMI 07/18/2020 06/16/2020 06/16/2020  Height 5' 3"  - 5' 3"   Weight 142 lbs - 153 lbs  BMI 81.27 - 51.70  Systolic 017 494 496  Diastolic 84 96 96  Pulse 66 75 78    CONSTITUTIONAL: Well-developed and well-nourished. No acute distress.  SKIN: Skin is warm and dry. No rash noted. No cyanosis. No pallor. No jaundice HEAD: Normocephalic and atraumatic.  EYES: No scleral icterus MOUTH/THROAT: Moist oral membranes.  NECK: No JVD present. No thyromegaly noted. No carotid bruits  LYMPHATIC: No visible cervical  adenopathy.  CHEST Normal respiratory effort. No intercostal retractions  LUNGS: Clear to auscultation bilaterally.  No stridor. No wheezes. No rales.  CARDIOVASCULAR: Regular, positive H0-T8, soft holosystolic murmur heard at the apex, soft systolic ejection murmur, no gallops or rubs. ABDOMINAL: No apparent ascites.  EXTREMITIES: 1+ bilateral pitting edema noted in the lower extremities up to the midshin level.  Pulses were difficult to palpate. HEMATOLOGIC: No significant bruising NEUROLOGIC: Oriented to person, place, and time. Nonfocal. Normal muscle tone.  PSYCHIATRIC: Normal mood and affect. Normal behavior.  Cooperative  CARDIAC DATABASE: EKG: 07/18/2020: Sinus Rhythm, 65bpm, LAE, normal axis, without underlying injury pattern.   Echocardiogram: 01/08/2020: Normal LV systolic function with visual EF 65-70%. Left ventricle cavity is normal in size. Mild left ventricular hypertrophy. Normal global wall motion. Doppler evidence of grade II diastolic dysfunction, elevated LAP. Calculated EF 68%. Left atrial cavity is mildly dilated. Mild (Grade I) aortic regurgitation. Mild (Grade I) mitral regurgitation. Mild tricuspid regurgitation. No evidence of pulmonary hypertension. RVSP measures 30 mmHg. Moderate pulmonic regurgitation. Trivial pericardial effusion with no hemodynamic compromise. No prior study for comparison.  Stress Testing: Lexiscan Tetrofosmin Stress Test 02/08/2020: 2 day protocol.  Nondiagnostic ECG stress due to pharmacologic stress with Lexiscan. Non specific ST segment change noted in stress in inferolateral leads.  Myocardial perfusion is abnormal.  There is a reversible moderate defect in the lateral and apical regions.  Overall LV systolic function is abnormal with regional wall motion abnormalities. Stress LV EF: 44% with hypokinesis in the same region.  No previous exam available for comparison. Intermediate risk study.   Heart Catheterization: 02/23/2020: LM: Normal LAD: Medial calcification with minimal luminal irregularities. LCx: Flush CTO. calcified vessel. Grade 2 left-to-left and right-to-left collaterals from LAD and RCA RCA: Minimal luminal irregularities. LVEDP normal  Lower Extremity Arterial Duplex 01/08/2020:  No hemodynamically significant stenoses are identified in the bilateral lower extremity arterial system.  This exam reveals normal perfusion of the right lower extremity (ABI 1.01) and mildly decreased perfusion of the left lower extremity, noted at the post tibial artery level (ABI 0.95).   Lower Extremity Venous Duplex Bilateral 01/29/2020:   No evidence of deep vein thrombosis of the lower extremities with normal venous return.  Generalized tissue edema noted.   LABORATORY DATA: External Labs: Collected: 12/11/2019 Creatinine 0.8 mg/dL. eGFR: 85 mL/min per 1.73 m Lipid profile: Total cholesterol 216, triglycerides 41, HDL 63, LDL 145, non-HDL 153 TSH: 0.34 and free T4 1.1 ng/mL  CMP Latest Ref Rng & Units 07/11/2020 03/24/2020 02/23/2020  Glucose 65 - 99 mg/dL 95 86 84  BUN 8 - 27 mg/dL 11 11 9   Creatinine 0.57 - 1.00 mg/dL 1.21(H) 0.98 1.08(H)  Sodium 134 - 144 mmol/L 145(H) 143 144  Potassium 3.5 - 5.2 mmol/L 5.0 4.0 3.5  Chloride 96 - 106 mmol/L 101 104 107  CO2 20 - 29 mmol/L 28 26 29   Calcium 8.7 - 10.3 mg/dL 9.5 9.8 9.3  Total Protein 6.0 - 8.3 g/dL - - -  Total Bilirubin 0.2 - 1.2 mg/dL - - -  Alkaline Phos 39 - 117 U/L - - -  AST 0 - 37 U/L - - -  ALT 0 - 35 U/L - - -   Lipid Panel     Component Value Date/Time   CHOL 226 (H) 05/29/2019 1029   TRIG 58.0 05/29/2019 1029   HDL 85.00 05/29/2019 1029   CHOLHDL 3 05/29/2019 1029   VLDL 11.6 05/29/2019 1029   LDLCALC 130 (  H) 05/29/2019 1029    No components found for: NTPROBNP Recent Labs    01/05/20 1158 03/24/20 1137 07/11/20 1012  PROBNP 524* 251 853*   No results for input(s): TSH in the last 8760 hours.  HEMOGLOBIN A1C Lab Results  Component Value Date   HGBA1C 5.8 07/21/2008   IMPRESSION:    ICD-10-CM   1. Chronic heart failure with preserved ejection fraction (HFpEF) (HCC)  I50.32 EKG 64-PPIR    Basic metabolic panel    Pro b natriuretic peptide (BNP)    Magnesium  2. Atherosclerosis of native coronary artery of native heart without angina pectoris  I25.10 aspirin EC 81 MG tablet  3. Benign hypertension  I10   4. Mixed hyperlipidemia  E78.2   5. Former smoker  Z87.891   75. Leg swelling  M79.89   7. Encounter to discuss test results  Z71.2      RECOMMENDATIONS: SAHAR RYBACK is a 75 y.o. female whose past medical history  and cardiac risk factors include: Hypertension, Hyperlipidemia, acute HFpEF/stage B/NYHA class III, former smoker.   Established coronary disease within native heart, without angina pectoris:  Patient underwent left heart catheterization due to an abnormal stress test and she is noted to have CTO of LCx with collaterals present.  Normal LVEDP and normal LVEF.  Medical management recommended.  No active angina pectoris.    Continue aspirin, statin therapy, Toprol-XL, benazepril.  Educated her on the importance of improving her cardiovascular risk factors.  Chronic heart failure with preserved EF, stage B, NYHA class III:  Medications reconciled.  We will discontinue torsemide for now given the elevated serum creatinine on most recent labs dating 07/11/2020.    Patient would like to try doing a pill pack for cardiovascular medications.  I will arrange that with our pharmacist Luis Abed.  For now I am separating her morning pills into different bags for simplicity.    Blood work in 1 week to reevaluate kidney function.    I informed the patient that we will use torsemide on a as needed basis as needed.  She is encouraged to check her weight on a daily basis.    Patient has lost approximately 11 pounds since last office visit, majority of it secondary to diuresis.  Lower extremity swelling: Hold diuretic therapy for now secondary to serum creatinine level.  She uses compression stockings, as needed basis.  Benign essential hypertension: Improving  Office blood pressure are improving.  Pill pack will be ordered for her cardiac medications.   Currently managed by primary team  Low-salt diet recommended.  Patient is asked to keep a log of her blood pressures and to bring them at the next office visit.  Mixed hyperlipidemia: Currently on statin therapy.  Currently managed by primary care provider.  Patient was requesting additional guidance regarding the work-up of cognitive  impairment/dementia.  I have asked her to follow-up with Dr. Francesco Sor her primary care provider for additional recommendations.  This was also conveyed to her friend, Darryll Capers, who usually accompanies her during her office visits.  FINAL MEDICATION LIST END OF ENCOUNTER: Meds ordered this encounter  Medications  . aspirin EC 81 MG tablet    Sig: Take 1 tablet (81 mg total) by mouth daily for 360 doses. Swallow whole.    Dispense:  90 tablet    Refill:  3    Medications Discontinued During This Encounter  Medication Reason  . pravastatin (PRAVACHOL) 40 MG tablet Error     Current  Outpatient Medications:  .  acetaminophen (TYLENOL) 500 MG tablet, Take 1,000 mg by mouth every 6 (six) hours as needed for mild pain or headache., Disp: , Rfl:  .  benazepril (LOTENSIN) 40 MG tablet, TAKE 1 TABLET BY MOUTH ONCE DAILY IN THE MORNING, Disp: 90 tablet, Rfl: 0 .  Calcium Carbonate-Vitamin D (CALCIUM-VITAMIN D) 600-125 MG-UNIT TABS, Take 1 tablet by mouth daily., Disp: , Rfl:  .  Cholecalciferol (VITAMIN D3) 50 MCG (2000 UT) TABS, Take 2,000 Units by mouth daily. , Disp: , Rfl:  .  hydrochlorothiazide (HYDRODIURIL) 25 MG tablet, TAKE 1 TABLET BY MOUTH ONCE DAILY IN THE MORNING, Disp: 90 tablet, Rfl: 0 .  metoprolol succinate (TOPROL-XL) 50 MG 24 hr tablet, TAKE ONE TABLET BY MOUTH IN THE MORNING. TAKE WITH OR IMMEDIATELY FOLLOWING A MEAL, Disp: 90 tablet, Rfl: 0 .  nitroGLYCERIN (NITROSTAT) 0.4 MG SL tablet, Place 1 tablet (0.4 mg total) under the tongue every 5 (five) minutes as needed for chest pain. If you require more than two tablets five minutes apart go to the nearest ER via EMS., Disp: 30 tablet, Rfl: 0 .  omeprazole (PRILOSEC) 20 MG capsule, Take 1 capsule by mouth daily as needed., Disp: , Rfl:  .  potassium chloride SA (KLOR-CON) 20 MEQ tablet, Take 1 tablet (20 mEq total) by mouth daily., Disp: 30 tablet, Rfl: 0 .  pravastatin (PRAVACHOL) 80 MG tablet, Take 80 mg by mouth at bedtime., Disp: , Rfl:   .  torsemide (DEMADEX) 10 MG tablet, TAKE 1 TABLET BY MOUTH IN THE MORNING (Patient taking differently: Take 10 mg by mouth daily as needed.), Disp: 90 tablet, Rfl: 0 .  traMADol (ULTRAM) 50 MG tablet, 1 tablet daily as needed., Disp: , Rfl:  .  aspirin EC 81 MG tablet, Take 1 tablet (81 mg total) by mouth daily for 360 doses. Swallow whole., Disp: 90 tablet, Rfl: 3  Orders Placed This Encounter  Procedures  . Basic metabolic panel  . Pro b natriuretic peptide (BNP)  . Magnesium  . EKG 12-Lead   There are no Patient Instructions on file for this visit.  --Continue cardiac medications as reconciled in final medication list. --Return in about 4 weeks (around 08/15/2020) for heart failure management.. Or sooner if needed. --Continue follow-up with your primary care physician regarding the management of your other chronic comorbid conditions.  Patient's questions and concerns were addressed to her satisfaction. She voices understanding of the instructions provided during this encounter.   This note was created using a voice recognition software as a result there may be grammatical errors inadvertently enclosed that do not reflect the nature of this encounter. Every attempt is made to correct such errors.  Total time spent: 33 minutes greater than 50% of time spent face-to-face discussing the management of congestive heart failure and coronary artery disease with medication changes.  [name], [place], [facility]  Pager: [phone] Office: [phone]

## 2020-07-26 MED ORDER — PRAVASTATIN SODIUM 80 MG PO TABS: 80.0000 mg | ORAL_TABLET | Freq: Every day | ORAL | 2 refills | Status: DC

## 2020-07-26 MED ORDER — BENAZEPRIL HCL 40 MG PO TABS: 40.0000 mg | ORAL_TABLET | Freq: Every day | ORAL | 2 refills | Status: DC

## 2020-07-26 MED ORDER — POTASSIUM CHLORIDE CRYS ER 20 MEQ PO TBCR: 20.0000 meq | EXTENDED_RELEASE_TABLET | Freq: Every day | ORAL | 2 refills | Status: DC | PRN

## 2020-07-26 MED ORDER — ASPIRIN EC 81 MG PO TBEC: 81.0000 mg | DELAYED_RELEASE_TABLET | Freq: Every day | ORAL | 3 refills | Status: DC

## 2020-07-26 MED ORDER — METOPROLOL SUCCINATE ER 50 MG PO TB24: 50.0000 mg | ORAL_TABLET | Freq: Every day | ORAL | 2 refills | Status: DC

## 2020-07-26 MED ORDER — NITROGLYCERIN 0.4 MG SL SUBL
0.4000 mg | SUBLINGUAL_TABLET | SUBLINGUAL | 1 refills | Status: DC | PRN
Start: 2020-07-26 — End: 2020-11-23

## 2020-07-26 MED ORDER — HYDROCHLOROTHIAZIDE 25 MG PO TABS: ORAL_TABLET | ORAL | 2 refills | Status: DC

## 2020-07-26 MED ORDER — TORSEMIDE 10 MG PO TABS: 10.0000 mg | ORAL_TABLET | Freq: Every day | ORAL | 2 refills | Status: DC | PRN

## 2020-07-29 LAB — BASIC METABOLIC PANEL
BUN/Creatinine Ratio: 18 (ref 12–28)
BUN: 19 mg/dL (ref 8–27)
CO2: 26 mmol/L (ref 20–29)
Calcium: 9.5 mg/dL (ref 8.7–10.3)
Chloride: 103 mmol/L (ref 96–106)
Creatinine, Ser: 1.06 mg/dL — ABNORMAL HIGH (ref 0.57–1.00)
GFR calc Af Amer: 61 mL/min/{1.73_m2} (ref >59)
GFR calc non Af Amer: 53 mL/min/{1.73_m2} — ABNORMAL LOW (ref >59)
Glucose: 89 mg/dL (ref 65–99)
Potassium: 4.3 mmol/L (ref 3.5–5.2)
Sodium: 145 mmol/L — ABNORMAL HIGH (ref 134–144)

## 2020-07-29 LAB — MAGNESIUM: Magnesium: 1.8 mg/dL (ref 1.6–2.3)

## 2020-07-29 LAB — PRO B NATRIURETIC PEPTIDE: NT-Pro BNP: 310 pg/mL — ABNORMAL HIGH (ref 0–301)

## 2020-08-01 NOTE — Progress Notes (Signed)
Reviewed lab results with pt. Pt reports to be feeling better and reports to be compliant with her medications Pt reports improvement in her dyspnea and edema symptoms since last OV. Called [facility] to follow up on enrolling pt into the monthly pill pack program. Per staff, still pending pt approval. Provided pt with [facility] pharmacy's contact information and instructed pt to call them as soon as possible to establish care. Next OV scheduled for 08/10/20.

## 2020-08-03 ENCOUNTER — Ambulatory Visit: Payer: Medicare Other

## 2020-08-08 ENCOUNTER — Telehealth: Payer: Self-pay

## 2020-08-08 NOTE — Telephone Encounter (Signed)
A clinical pharmacist from Dr. Kathlyn Sacramento office called regarding some information about the pt. She would like to know how her electrolytes look. She also would like to know if the pt should continue taking both the Hydrochlorothiazide and the Torsemide.

## 2020-08-08 NOTE — Telephone Encounter (Signed)
Called and reviewed with PCP office/[facility] pharmacist, Dannielle Karvonen [phone]), to confirm pt's med list and recent lab results. Confirmed enrollment in [facility] monthly pill pack program. Reviewed that pt to continue current HCTZ 25 mg daily and torsemide 10 mg PRN for lower extremity swelling/SOB symptoms. Torsemide will be delivered separately in a vial, along with Klor Con 20 mEq, for weight gain >3 lbs/day or >5 lbs/week.

## 2020-08-08 NOTE — Telephone Encounter (Signed)
Phillips Odor can you please follow-up.

## 2020-08-09 NOTE — Telephone Encounter (Signed)
Thank-you for your help. She is here tomorrow for appt.

## 2020-08-10 ENCOUNTER — Encounter: Payer: Self-pay | Admitting: Cardiology

## 2020-08-10 ENCOUNTER — Other Ambulatory Visit: Payer: Self-pay

## 2020-08-10 ENCOUNTER — Ambulatory Visit: Payer: 59 | Admitting: Cardiology

## 2020-08-10 VITALS — BP 167/90 | HR 68 | Temp 98.0°F | Resp 16 | Ht 63.0 in | Wt 145.0 lb

## 2020-08-10 DIAGNOSIS — I10 Essential (primary) hypertension: Secondary | ICD-10-CM

## 2020-08-10 DIAGNOSIS — I5031 Acute diastolic (congestive) heart failure: Secondary | ICD-10-CM

## 2020-08-10 DIAGNOSIS — I2510 Atherosclerotic heart disease of native coronary artery without angina pectoris: Secondary | ICD-10-CM

## 2020-08-10 DIAGNOSIS — E782 Mixed hyperlipidemia: Secondary | ICD-10-CM

## 2020-08-10 DIAGNOSIS — M7989 Other specified soft tissue disorders: Secondary | ICD-10-CM

## 2020-08-10 DIAGNOSIS — Z87891 Personal history of nicotine dependence: Secondary | ICD-10-CM

## 2020-08-10 NOTE — Progress Notes (Signed)
Date:  08/10/2020   ID:  Tanya Gray, DOB 1945/12/24, MRN [id number]  PCP:  [name], DO  Cardiologist:  [name], DO, [facility] (established care 01/04/2020)  Date: 08/10/20 Last Office Visit: 06/16/2020  Chief Complaint  Patient presents with  . Chronic heart failure with preserved ejection fraction (HFp  . Follow-up    HPI  Tanya Gray is a 75 y.o. female who presents to the office with a chief complaint of " 71-monthfollow-up for CAD and heart failure management." Patient's past medical history and cardiovascular risk factors include: Hypertension, Hyperlipidemia, chronic HFpEF/stage B/NYHA class II, coronary artery disease without angina pectoris, former smoker.   Patient presents with one of her church members [name] at today's office visit. She provides verbal consent in regards to discussing her medical information in her presence.  Patient states that VGeorgiann [phone].  Patient was originally referred to the office at the request of her primary care provider for evaluation and management of chest pain and lower extremity swelling.  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  No intervention was performed and medical therapy was recommended.   During the last several visits patient has been seen more often as coordination of care is starting to become difficult given her underlying dementia/cognitive impairment.  At the last office visit we discussed the importance of medication compliance to help prevent heart failure hospitalizations and to improve her morbidity mortality long-term.  At last office visit we also discussed transitioning to a pill pack and keeping the torsemide and potassium supplements on as needed basis.  Since last office visit patient has gained approximately 3 pounds  and has bilateral lower extremity swelling. [facility] that Ms. MThumanhas not taken her medications for the last 3 days at least.  She also forgot to take her morning medications prior to coming to the office and therefore her blood pressure is elevated.  At the last visit she was recommended not to take potassium supplements unless if she takes torsemide.  However she continues to take potassium supplements on a daily basis.    FUNCTIONAL STATUS: No structured exercise program or daily routine.   ALLERGIES: No Known Allergies  MEDICATION LIST PRIOR TO VISIT: Current Meds  Medication Sig  . acetaminophen (TYLENOL) 500 MG tablet Take 1,000 mg by mouth every 6 (six) hours as needed for mild pain or headache.  .Marland Kitchenaspirin EC 81 MG tablet Take 1 tablet (81 mg total) by mouth daily for 360 doses. Swallow whole.  . benazepril (LOTENSIN) 40 MG tablet Take 1 tablet (40 mg total) by mouth daily.  . Calcium Carbonate-Vitamin D (CALCIUM-VITAMIN D) 600-125 MG-UNIT TABS Take 1 tablet by mouth daily.  . Cholecalciferol (VITAMIN D3) 50 MCG (2000 UT) TABS Take 2,000 Units by mouth daily.   . hydrochlorothiazide (HYDRODIURIL) 25 MG tablet TAKE 1 TABLET BY MOUTH ONCE DAILY IN THE MORNING  . metoprolol succinate (TOPROL-XL) 50 MG 24 hr tablet Take 1 tablet (50 mg total) by mouth daily. Take with or immediately following a meal.  . nitroGLYCERIN (NITROSTAT) 0.4 MG SL tablet Place 1 tablet (0.4 mg total) under the tongue every 5 (five) minutes as needed for chest pain. If you require more than two tablets five minutes apart go to the  nearest ER via EMS.  Marland Kitchen omeprazole (PRILOSEC) 20 MG capsule Take 1 capsule by mouth daily as needed.  . potassium chloride SA (KLOR-CON) 20 MEQ tablet Take 1 tablet (20 mEq total) by mouth daily as needed (With water pill (torsemide)).  . pravastatin (PRAVACHOL) 80 MG tablet Take 1 tablet (80 mg total) by mouth daily.  Marland Kitchen  torsemide (DEMADEX) 10 MG tablet Take 1 tablet (10 mg total) by mouth daily as needed (Swelling, Shortness or breath).  . traMADol (ULTRAM) 50 MG tablet 1 tablet daily as needed.     PAST MEDICAL HISTORY: Past Medical History:  Diagnosis Date  . AORTIC INSUFFICIENCY 08/03/2009  . Cardiomegaly 01/29/2007  . CHF (congestive heart failure) ([place])   . Coronary artery disease   . Diverticulosis of colon (without mention of hemorrhage) 08/08/2006  . ESOPHAGEAL STRICTURE 02/19/2008  . Heartburn 02/19/2008  . HYPERLIPIDEMIA 02/18/2008  . HYPERTENSION, BENIGN SYSTEMIC 08/08/2006  . MYALGIA 01/29/2007  . OSTEOARTHRITIS 12/21/2008  . PEPTIC ULCER DISEASE 12/21/2008  . RHINITIS, ALLERGIC 08/08/2006  . Unspecified asthma(493.90) 08/08/2006    PAST SURGICAL HISTORY: Past Surgical History:  Procedure Laterality Date  . ABDOMINAL HYSTERECTOMY    . CARDIAC CATHETERIZATION    . ENDOTRACHEAL INTUBATION EMERGENT    . ESOPHAGOGASTRODUODENOSCOPY    . LEFT HEART CATH AND CORONARY ANGIOGRAPHY N/A 02/23/2020   Procedure: LEFT HEART CATH AND CORONARY ANGIOGRAPHY;  Surgeon: Nigel Mormon, MD;  Location: [place] [facility];  Service: Cardiovascular;  Laterality: N/A;    FAMILY HISTORY: The patient family history includes Aneurysm in her sister; Diabetes in her mother; Hypertension in her mother; Lung disease in her mother.  SOCIAL HISTORY:  The patient  reports that she quit smoking about 17 years ago. Her smoking use included cigarettes. She has a 15.00 pack-year smoking history. She has never used smokeless tobacco. She reports current alcohol use of about 1.0 - 2.0 standard drink of alcohol per week. She reports that she does not use drugs.  REVIEW OF SYSTEMS: Review of Systems  Constitutional: Negative for chills and fever.  HENT: Negative for hoarse voice and nosebleeds.   Eyes: Negative for discharge, double vision and pain.  Cardiovascular: Negative for chest pain, dyspnea on exertion, leg swelling,  near-syncope, orthopnea, palpitations, paroxysmal nocturnal dyspnea and syncope.  Respiratory: Negative for hemoptysis and shortness of breath.   Musculoskeletal: Negative for muscle cramps and myalgias.  Gastrointestinal: Negative for abdominal pain, constipation, diarrhea, hematemesis, hematochezia, melena, nausea and vomiting.  Neurological: Negative for dizziness and light-headedness.   PHYSICAL EXAM: Vitals with BMI 08/10/2020 08/10/2020 08/10/2020  Height - - 5' 3"   Weight - - 145 lbs  BMI - - 16.10  Systolic 960 454 098  Diastolic 90 91 96  Pulse - 68 65    CONSTITUTIONAL: Well-developed and well-nourished. No acute distress.  SKIN: Skin is warm and dry. No rash noted. No cyanosis. No pallor. No jaundice HEAD: Normocephalic and atraumatic.  EYES: No scleral icterus MOUTH/THROAT: Moist oral membranes.  NECK: No JVD present. No thyromegaly noted. No carotid bruits  LYMPHATIC: No visible cervical adenopathy.  CHEST Normal respiratory effort. No intercostal retractions  LUNGS: Clear to auscultation bilaterally.  No stridor. No wheezes. No rales.  CARDIOVASCULAR: Regular, positive J1-B1, soft holosystolic murmur heard at the apex, soft systolic ejection murmur, no gallops or rubs. ABDOMINAL: No apparent ascites.  EXTREMITIES: 1+ bilateral pitting edema noted in the lower extremities up to the midshin level.  Pulses were difficult to palpate. HEMATOLOGIC: No  significant bruising NEUROLOGIC: Oriented to person, place, and time. Nonfocal. Normal muscle tone.  PSYCHIATRIC: Normal mood and affect. Normal behavior. Cooperative  CARDIAC DATABASE: EKG: 07/18/2020: Sinus Rhythm, 65bpm, LAE, normal axis, without underlying injury pattern.   Echocardiogram: 01/08/2020: Normal LV systolic function with visual EF 65-70%. Left ventricle cavity is normal in size. Mild left ventricular hypertrophy. Normal global wall motion. Doppler evidence of grade II diastolic dysfunction, elevated LAP.  Calculated EF 68%. Left atrial cavity is mildly dilated. Mild (Grade I) aortic regurgitation. Mild (Grade I) mitral regurgitation. Mild tricuspid regurgitation. No evidence of pulmonary hypertension. RVSP measures 30 mmHg. Moderate pulmonic regurgitation. Trivial pericardial effusion with no hemodynamic compromise. No prior study for comparison.  Stress Testing: Lexiscan Tetrofosmin Stress Test 02/08/2020: 2 day protocol.  Nondiagnostic ECG stress due to pharmacologic stress with Lexiscan. Non specific ST segment change noted in stress in inferolateral leads.  Myocardial perfusion is abnormal.  There is a reversible moderate defect in the lateral and apical regions.  Overall LV systolic function is abnormal with regional wall motion abnormalities. Stress LV EF: 44% with hypokinesis in the same region.  No previous exam available for comparison. Intermediate risk study.   Heart Catheterization: 02/23/2020: LM: Normal LAD: Medial calcification with minimal luminal irregularities. LCx: Flush CTO. calcified vessel. Grade 2 left-to-left and right-to-left collaterals from LAD and RCA RCA: Minimal luminal irregularities. LVEDP normal  Lower Extremity Arterial Duplex 01/08/2020:  No hemodynamically significant stenoses are identified in the bilateral lower extremity arterial system.  This exam reveals normal perfusion of the right lower extremity (ABI 1.01) and mildly decreased perfusion of the left lower extremity, noted at the post tibial artery level (ABI 0.95).   Lower Extremity Venous Duplex Bilateral 01/29/2020:  No evidence of deep vein thrombosis of the lower extremities with normal venous return.  Generalized tissue edema noted.   LABORATORY DATA: External Labs: Collected: 12/11/2019 Creatinine 0.8 mg/dL. eGFR: 85 mL/min per 1.73 m Lipid profile: Total cholesterol 216, triglycerides 41, HDL 63, LDL 145, non-HDL 153 TSH: 0.34 and free T4 1.1 ng/mL  CMP Latest Ref Rng &  Units 07/28/2020 07/11/2020 03/24/2020  Glucose 65 - 99 mg/dL 89 95 86  BUN 8 - 27 mg/dL 19 11 11   Creatinine 0.57 - 1.00 mg/dL 1.06(H) 1.21(H) 0.98  Sodium 134 - 144 mmol/L 145(H) 145(H) 143  Potassium 3.5 - 5.2 mmol/L 4.3 5.0 4.0  Chloride 96 - 106 mmol/L 103 101 104  CO2 20 - 29 mmol/L 26 28 26   Calcium 8.7 - 10.3 mg/dL 9.5 9.5 9.8  Total Protein 6.0 - 8.3 g/dL - - -  Total Bilirubin 0.2 - 1.2 mg/dL - - -  Alkaline Phos 39 - 117 U/L - - -  AST 0 - 37 U/L - - -  ALT 0 - 35 U/L - - -   Lipid Panel     Component Value Date/Time   CHOL 226 (H) 05/29/2019 1029   TRIG 58.0 05/29/2019 1029   HDL 85.00 05/29/2019 1029   CHOLHDL 3 05/29/2019 1029   VLDL 11.6 05/29/2019 1029   LDLCALC 130 (H) 05/29/2019 1029    No components found for: NTPROBNP Recent Labs    01/05/20 1158 03/24/20 1137 07/11/20 1012 07/28/20 1144  PROBNP 524* 251 853* 310*   No results for input(s): TSH in the last 8760 hours.  HEMOGLOBIN A1C Lab Results  Component Value Date   HGBA1C 5.8 07/21/2008   IMPRESSION:    ICD-10-CM   1. Acute heart failure with  preserved ejection fraction (HFpEF) (HCC)  T06.26 Basic metabolic panel    Magnesium    Pro b natriuretic peptide (BNP)  2. Atherosclerosis of native coronary artery of native heart without angina pectoris  I25.10   3. Benign hypertension  I10   4. Mixed hyperlipidemia  E78.2   5. Leg swelling  M79.89   6. Former smoker  Z87.891      RECOMMENDATIONS: SHERON TALLMAN is a 75 y.o. female whose past medical history and cardiac risk factors include: Hypertension, Hyperlipidemia, acute HFpEF/stage B/NYHA class III, former smoker.   Acute on chronic heart failure with preserved EF, stage B, NYHA class III:  Medications reconciled.  She has bilateral lower extremity swelling and also 3 pounds of weight gain since last visit.  Patient's care has becoming challenging given her underlying dementia/cognitive impairment which may be contributing to  her medication noncompliance and following directions.  At the last visit we decided to proceed with pill pack service for her daily medications and to keep torsemide and potassium on a as needed basis.  However, patient states that she does not take torsemide as discussed before but continues to take potassium supplements.  Morning blood pressures are elevated at today's office visit as she forgot to take her medications prior to coming in.  Ms. Darryll Capers also states that she probably forgot to take her medications for the last 3 days as the reason why she is having lower extremity swelling.  I encouraged the patient to discuss her goals of care with her family and next of kin to get assistance with regards to coordinating her medications and taking them on time.  Established coronary disease within native heart, without angina pectoris:  Patient underwent left heart catheterization due to an abnormal stress test and she is noted to have CTO of LCx with collaterals present.  Normal LVEDP and normal LVEF.  Medical management recommended.  No active angina pectoris.    Continue aspirin, statin therapy, Toprol-XL, benazepril.  Educated her on the importance of improving her cardiovascular risk factors.  Benign essential hypertension:   Office blood pressure not at goal, she has not taken her am medications.   Pill pack will be ordered for her cardiac medications, still pending.   Low-salt diet recommended.  Patient is asked to keep a log of her blood pressures and to bring them at the next office visit.  Mixed hyperlipidemia: Currently on statin therapy.  Currently managed by primary care provider.  Patient was requesting additional guidance regarding the work-up of cognitive impairment/dementia.  I have asked her to follow-up with Dr. Francesco Sor her primary care provider for additional recommendations.  This was also conveyed to her friend, Darryll Capers, who usually accompanies her during her office  visits.  FINAL MEDICATION LIST END OF ENCOUNTER: No orders of the defined types were placed in this encounter.   Medications Discontinued During This Encounter  Medication Reason  . pravastatin (PRAVACHOL) 80 MG tablet Duplicate  . aspirin EC 81 MG tablet Error     Current Outpatient Medications:  .  acetaminophen (TYLENOL) 500 MG tablet, Take 1,000 mg by mouth every 6 (six) hours as needed for mild pain or headache., Disp: , Rfl:  .  aspirin EC 81 MG tablet, Take 1 tablet (81 mg total) by mouth daily for 360 doses. Swallow whole., Disp: 90 tablet, Rfl: 3 .  benazepril (LOTENSIN) 40 MG tablet, Take 1 tablet (40 mg total) by mouth daily., Disp: 90 tablet, Rfl: 2 .  Calcium Carbonate-Vitamin D (CALCIUM-VITAMIN D) 600-125 MG-UNIT TABS, Take 1 tablet by mouth daily., Disp: , Rfl:  .  Cholecalciferol (VITAMIN D3) 50 MCG (2000 UT) TABS, Take 2,000 Units by mouth daily. , Disp: , Rfl:  .  hydrochlorothiazide (HYDRODIURIL) 25 MG tablet, TAKE 1 TABLET BY MOUTH ONCE DAILY IN THE MORNING, Disp: 90 tablet, Rfl: 2 .  metoprolol succinate (TOPROL-XL) 50 MG 24 hr tablet, Take 1 tablet (50 mg total) by mouth daily. Take with or immediately following a meal., Disp: 90 tablet, Rfl: 2 .  nitroGLYCERIN (NITROSTAT) 0.4 MG SL tablet, Place 1 tablet (0.4 mg total) under the tongue every 5 (five) minutes as needed for chest pain. If you require more than two tablets five minutes apart go to the nearest ER via EMS., Disp: 30 tablet, Rfl: 1 .  omeprazole (PRILOSEC) 20 MG capsule, Take 1 capsule by mouth daily as needed., Disp: , Rfl:  .  potassium chloride SA (KLOR-CON) 20 MEQ tablet, Take 1 tablet (20 mEq total) by mouth daily as needed (With water pill (torsemide))., Disp: 90 tablet, Rfl: 2 .  pravastatin (PRAVACHOL) 80 MG tablet, Take 1 tablet (80 mg total) by mouth daily., Disp: 90 tablet, Rfl: 2 .  torsemide (DEMADEX) 10 MG tablet, Take 1 tablet (10 mg total) by mouth daily as needed (Swelling, Shortness or  breath)., Disp: 90 tablet, Rfl: 2 .  traMADol (ULTRAM) 50 MG tablet, 1 tablet daily as needed., Disp: , Rfl:   Orders Placed This Encounter  Procedures  . Basic metabolic panel  . Magnesium  . Pro b natriuretic peptide (BNP)   There are no Patient Instructions on file for this visit.  --Continue cardiac medications as reconciled in final medication list. --Return in about 1 month (around 09/12/2020) for Follow up, heart failure management.. Or sooner if needed. --Continue follow-up with your primary care physician regarding the management of your other chronic comorbid conditions.  Patient's questions and concerns were addressed to her satisfaction. She voices understanding of the instructions provided during this encounter.   This note was created using a voice recognition software as a result there may be grammatical errors inadvertently enclosed that do not reflect the nature of this encounter. Every attempt is made to correct such errors.  Total time spent: 37 minutes greater than 50% of time spent face-to-face discussing the management of congestive heart failure and coronary artery disease with medication changes.  [name], [place], [facility]  Pager: [phone] Office: [phone]

## 2020-08-15 ENCOUNTER — Telehealth: Payer: Self-pay

## 2020-08-15 ENCOUNTER — Other Ambulatory Visit: Payer: Self-pay

## 2020-08-15 DIAGNOSIS — I5031 Acute diastolic (congestive) heart failure: Secondary | ICD-10-CM

## 2020-08-15 NOTE — Telephone Encounter (Signed)
Error

## 2020-09-10 LAB — BASIC METABOLIC PANEL
BUN/Creatinine Ratio: 20 (ref 12–28)
BUN: 25 mg/dL (ref 8–27)
CO2: 22 mmol/L (ref 20–29)
Calcium: 9.8 mg/dL (ref 8.7–10.3)
Chloride: 101 mmol/L (ref 96–106)
Creatinine, Ser: 1.22 mg/dL — ABNORMAL HIGH (ref 0.57–1.00)
Glucose: 102 mg/dL — ABNORMAL HIGH (ref 65–99)
Potassium: 4 mmol/L (ref 3.5–5.2)
Sodium: 142 mmol/L (ref 134–144)
eGFR: 47 mL/min/{1.73_m2} — ABNORMAL LOW (ref >59)

## 2020-09-10 LAB — MAGNESIUM: Magnesium: 2 mg/dL (ref 1.6–2.3)

## 2020-09-10 LAB — PRO B NATRIURETIC PEPTIDE: NT-Pro BNP: 192 pg/mL (ref 0–301)

## 2020-09-14 NOTE — Progress Notes (Signed)
ID:  LEDONNA Gray, DOB [birth year], MRN [id number]  PCP:  [name], DO  Cardiologist: [name], DO, [place] (established care 01/04/2020)  Date: 09/14/2020 Last Office Visit: 08/10/2020  Chief Complaint  Patient presents with  . Follow-up    1 month   . Congestive Heart Failure    HPI  Tanya Gray is a 75 y.o. female who presents to the office with a chief complaint of " 1 month follow-up for congestive heart failure management." Patient's past medical history and cardiovascular risk factors include: Hypertension, Hyperlipidemia, chronic HFpEF/stage B/NYHA class II, coronary artery disease without angina pectoris, former smoker.   Patient presents with one of her church members Tanya Gray at today's office visit. She provides verbal consent in regards to discussing her medical information in her presence.  Patient states that Tanya Gray [phone] lives in [place] has the patient's power of attorney.  Patient was originally referred to the office at the request of her primary care provider for evaluation and management of chest pain and lower extremity swelling.  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  No intervention was performed and medical therapy was recommended.   I have been seeing the patient more often than usual as her care is complicated by her underlying dementia/cognitive impairment and medication compliance.  These efforts are implemented to prevent hospitalization and to prevent patient decompensation.  At the last office visit we started doing a pill pack approach for cardiac medications and keeping the torsemide and potassium supplements in different prescription bottles to use on as needed basis as her hemodynamics and weight fluctuates.  She is here for follow-up.  Since last office visit she has gained additional 3 pounds very similar  to her prior office visit.  I suspect this is most likely secondary to it being noncompliant with diuretic therapy and dietary indiscretion.  She also makes statements with regards to other people who come meet her taking her medications.  Currently denies any chest pain or anginal equivalent.  Shortness of breath is chronic and stable more pronounced with effort late activities and similar lower extremity swelling.   FUNCTIONAL STATUS: No structured exercise program or daily routine.   ALLERGIES: No Known Allergies  MEDICATION LIST PRIOR TO VISIT: Current Meds  Medication Sig  . acetaminophen (TYLENOL) 500 MG tablet Take 1,000 mg by mouth every 6 (six) hours as needed for mild pain or headache.  Marland Kitchen aspirin EC 81 MG tablet Take 1 tablet (81 mg total) by mouth daily for 360 doses. Swallow whole.  . benazepril (LOTENSIN) 40 MG tablet Take 1 tablet (40 mg total) by mouth daily.  . Calcium Carbonate-Vitamin D (CALCIUM-VITAMIN D) 600-125 MG-UNIT TABS Take 1 tablet by mouth daily.  . Cholecalciferol (VITAMIN D3) 50 MCG (2000 UT) TABS Take 2,000 Units by mouth daily.   . hydrochlorothiazide (HYDRODIURIL) 25 MG tablet TAKE 1 TABLET BY MOUTH ONCE DAILY IN THE MORNING  . metoprolol succinate (TOPROL-XL) 50 MG 24 hr tablet Take 1 tablet (50 mg total) by mouth daily. Take with or immediately following a meal.  . nitroGLYCERIN (NITROSTAT) 0.4 MG SL tablet Place 1 tablet (0.4 mg total) under the tongue every 5 (five) minutes as needed for chest pain. If you require more than two tablets five minutes apart go to the nearest ER via EMS.  Marland Kitchen omeprazole (PRILOSEC) 20 MG capsule Take 1 capsule by mouth  daily as needed.  . potassium chloride SA (KLOR-CON) 20 MEQ tablet Take 1 tablet (20 mEq total) by mouth daily as needed (With water pill (torsemide)).  . pravastatin (PRAVACHOL) 80 MG tablet Take 1 tablet (80 mg total) by mouth daily.  Marland Kitchen torsemide (DEMADEX) 10 MG tablet Take 1 tablet (10 mg total) by mouth  daily as needed (Swelling, Shortness or breath).  . traMADol (ULTRAM) 50 MG tablet 1 tablet daily as needed.     PAST MEDICAL HISTORY: Past Medical History:  Diagnosis Date  . AORTIC INSUFFICIENCY 08/03/2009  . Cardiomegaly 01/29/2007  . CHF (congestive heart failure) ([place])   . Coronary artery disease   . Diverticulosis of colon (without mention of hemorrhage) 08/08/2006  . ESOPHAGEAL STRICTURE 02/19/2008  . Heartburn 02/19/2008  . HYPERLIPIDEMIA 02/18/2008  . HYPERTENSION, BENIGN SYSTEMIC 08/08/2006  . MYALGIA 01/29/2007  . OSTEOARTHRITIS 12/21/2008  . PEPTIC ULCER DISEASE 12/21/2008  . RHINITIS, ALLERGIC 08/08/2006  . Unspecified asthma(493.90) 08/08/2006    PAST SURGICAL HISTORY: Past Surgical History:  Procedure Laterality Date  . ABDOMINAL HYSTERECTOMY    . CARDIAC CATHETERIZATION    . ENDOTRACHEAL INTUBATION EMERGENT    . ESOPHAGOGASTRODUODENOSCOPY    . LEFT HEART CATH AND CORONARY ANGIOGRAPHY N/A 02/23/2020   Procedure: LEFT HEART CATH AND CORONARY ANGIOGRAPHY;  Surgeon: Nigel Mormon, MD;  Location: [facility];  Service: Cardiovascular;  Laterality: N/A;    FAMILY HISTORY: The patient family history includes Aneurysm in her sister; Diabetes in her mother; Hypertension in her mother; Lung disease in her mother.  SOCIAL HISTORY:  The patient  reports that she quit smoking about 17 years ago. Her smoking use included cigarettes. She has a 15.00 pack-year smoking history. She has never used smokeless tobacco. She reports current alcohol use of about 1.0 - 2.0 standard drink of alcohol per week. She reports that she does not use drugs.  REVIEW OF SYSTEMS: Review of Systems  Constitutional: Negative for chills and fever.  HENT: Negative for hoarse voice and nosebleeds.   Eyes: Negative for discharge, double vision and pain.  Cardiovascular: Positive for dyspnea on exertion (Chronic and stable) and leg swelling (Stable). Negative for chest pain, near-syncope, orthopnea,  palpitations, paroxysmal nocturnal dyspnea and syncope.  Respiratory: Negative for hemoptysis and shortness of breath.   Musculoskeletal: Negative for muscle cramps and myalgias.  Gastrointestinal: Negative for abdominal pain, constipation, diarrhea, hematemesis, hematochezia, melena, nausea and vomiting.  Neurological: Negative for dizziness and light-headedness.   PHYSICAL EXAM: Vitals with BMI 09/15/2020 08/10/2020 08/10/2020  Height 5' 3"  - -  Weight 148 lbs 6 oz - -  BMI 14.48 - -  Systolic 185 631 497  Diastolic 86 90 91  Pulse 57 - 68    CONSTITUTIONAL: Well-developed and well-nourished. No acute distress.  SKIN: Skin is warm and dry. No rash noted. No cyanosis. No pallor. No jaundice HEAD: Normocephalic and atraumatic.  EYES: No scleral icterus MOUTH/THROAT: Moist oral membranes.  NECK: No JVD present. No thyromegaly noted. No carotid bruits  LYMPHATIC: No visible cervical adenopathy.  CHEST Normal respiratory effort. No intercostal retractions  LUNGS: Clear to auscultation bilaterally.  No stridor. No wheezes. No rales.  CARDIOVASCULAR: Regular, positive W2-O3, soft holosystolic murmur heard at the apex, soft systolic ejection murmur, no gallops or rubs. ABDOMINAL: No apparent ascites.  EXTREMITIES: 1+ bilateral pitting edema noted in the lower extremities up to the midshin level.  Pulses were difficult to palpate. HEMATOLOGIC: No significant bruising NEUROLOGIC: Oriented to person, place,  and time. Nonfocal. Normal muscle tone.  PSYCHIATRIC: Normal mood and affect. Normal behavior. Cooperative  CARDIAC DATABASE: EKG: 07/18/2020: Sinus Rhythm, 65bpm, LAE, normal axis, without underlying injury pattern.   Echocardiogram: 01/08/2020: Normal LV systolic function with visual EF 65-70%. Left ventricle cavity is normal in size. Mild left ventricular hypertrophy. Normal global wall motion. Doppler evidence of grade II diastolic dysfunction, elevated LAP. Calculated EF 68%. Left  atrial cavity is mildly dilated.Mild (Grade I) aortic regurgitation.Mild (Grade I) mitral regurgitation.Mild tricuspid regurgitation. No evidence of pulmonary hypertension. RVSP measures 30 mmHg.Moderate pulmonic regurgitation.Trivial pericardial effusion with no hemodynamic compromise.No prior study for comparison.  Stress Testing: Lexiscan Tetrofosmin Stress Test 02/08/2020: 2 day protocol.  Nondiagnostic ECG stress due to pharmacologic stress with Lexiscan. Non specific ST segment change noted in stress in inferolateral leads.  Myocardial perfusion is abnormal.  There is a reversible moderate defect in the lateral and apical regions.  Overall LV systolic function is abnormal with regional wall motion abnormalities. Stress LV EF: 44% with hypokinesis in the same region.  No previous exam available for comparison. Intermediate risk study.   Heart Catheterization: 02/23/2020: LM: Normal LAD: Medial calcification with minimal luminal irregularities. LCx: Flush CTO. calcified vessel. Grade 2 left-to-left and right-to-left collaterals from LAD and RCA RCA: Minimal luminal irregularities. LVEDP normal  Lower Extremity Arterial Duplex 01/08/2020:  No hemodynamically significant stenoses are identified in the bilateral lower extremity arterial system.  This exam reveals normal perfusion of the right lower extremity (ABI 1.01) and mildly decreased perfusion of the left lower extremity, noted at the post tibial artery level (ABI 0.95).   Lower Extremity Venous Duplex Bilateral 01/29/2020:  No evidence of deep vein thrombosis of the lower extremities with normal venous return.  Generalized tissue edema noted.   LABORATORY DATA: External Labs: Collected: 12/11/2019 Creatinine 0.8 mg/dL. eGFR: 85 mL/min per 1.73 m Lipid profile: Total cholesterol 216, triglycerides 41, HDL 63, LDL 145, non-HDL 153 TSH: 0.34 and free T4 1.1 ng/mL  CMP Latest Ref Rng & Units 09/09/2020 07/28/2020 07/11/2020   Glucose 65 - 99 mg/dL 102(H) 89 95  BUN 8 - 27 mg/dL 25 19 11   Creatinine 0.57 - 1.00 mg/dL 1.22(H) 1.06(H) 1.21(H)  Sodium 134 - 144 mmol/L 142 145(H) 145(H)  Potassium 3.5 - 5.2 mmol/L 4.0 4.3 5.0  Chloride 96 - 106 mmol/L 101 103 101  CO2 20 - 29 mmol/L 22 26 28   Calcium 8.7 - 10.3 mg/dL 9.8 9.5 9.5  Total Protein 6.0 - 8.3 g/dL - - -  Total Bilirubin 0.2 - 1.2 mg/dL - - -  Alkaline Phos 39 - 117 U/L - - -  AST 0 - 37 U/L - - -  ALT 0 - 35 U/L - - -   Lipid Panel     Component Value Date/Time   CHOL 226 (H) 05/29/2019 1029   TRIG 58.0 05/29/2019 1029   HDL 85.00 05/29/2019 1029   CHOLHDL 3 05/29/2019 1029   VLDL 11.6 05/29/2019 1029   LDLCALC 130 (H) 05/29/2019 1029    No components found for: NTPROBNP Recent Labs    01/05/20 1158 03/24/20 1137 07/11/20 1012 07/28/20 1144 09/09/20 1057  PROBNP 524* 251 853* 310* 192   No results for input(s): TSH in the last 8760 hours.  HEMOGLOBIN A1C Lab Results  Component Value Date   HGBA1C 5.8 07/21/2008   IMPRESSION:    ICD-10-CM   1. Acute heart failure with preserved ejection fraction (HFpEF) (HCC)  I50.31   2.  Atherosclerosis of native coronary artery of native heart without angina pectoris  I25.10   3. Benign hypertension  I10   4. Mixed hyperlipidemia  E78.2   5. Leg swelling  M79.89   6. Former smoker  Z87.891      RECOMMENDATIONS: KENYONA RENA is a 75 y.o. female whose past medical history and cardiac risk factors include: Hypertension, Hyperlipidemia, acute HFpEF/stage B/NYHA class III, former smoker.   Acute on chronic heart failure with preserved EF, stage B, NYHA class III:  Medications reconciled.  Independently reviewed labs from 09/09/2020.    NT proBNP trending down compared to February 2022.  Patient's care has becoming challenging given her underlying dementia/cognitive impairment which may be contributing to her medication noncompliance and following directions.  I encouraged the  patient to discuss her goals of care with her family and next of kin to get assistance with regards to coordinating her medications and taking them on time.  This is also discussed with her caregiver/friend Tanya Gray who comes to the office visits on a periodic basis.  May also consider home health care if agreeable with patient's PCP.  Established coronary disease within native heart, without angina pectoris:  LCx appears to be a CTO with collaterals present.  For now recommended medical management as she is asymptomatic.  Due to underlying dementia/cognitive impairment dual antiplatelet therapy compliance if not followed will predispose her to higher risk than benefit from coronary interventions.    Continue aspirin, statin therapy, Toprol-XL, benazepril  Educated her on the importance of improving her cardiovascular risk factors.  Benign essential hypertension:   Education importance of medication compliance.  Low-salt diet recommended.  Patient is asked to keep a log of her blood pressures and to bring them at the next office visit.  Mixed hyperlipidemia: Currently on statin therapy.  Currently managed by primary care provider.  Patient was requesting additional guidance regarding the work-up of cognitive impairment/dementia.  I have asked her to follow-up with Dr. Francesco Sor her primary care provider for additional recommendations.  This was also conveyed to her friend, Darryll Capers, who usually accompanies her during her office visits.  I also think that she should be evaluated for home health care if it is a covered benefit.  I will defer to PCP at this time.  FINAL MEDICATION LIST END OF ENCOUNTER: No orders of the defined types were placed in this encounter.   There are no discontinued medications.   Current Outpatient Medications:  .  acetaminophen (TYLENOL) 500 MG tablet, Take 1,000 mg by mouth every 6 (six) hours as needed for mild pain or headache., Disp: , Rfl:  .  aspirin EC 81 MG  tablet, Take 1 tablet (81 mg total) by mouth daily for 360 doses. Swallow whole., Disp: 90 tablet, Rfl: 3 .  benazepril (LOTENSIN) 40 MG tablet, Take 1 tablet (40 mg total) by mouth daily., Disp: 90 tablet, Rfl: 2 .  Calcium Carbonate-Vitamin D (CALCIUM-VITAMIN D) 600-125 MG-UNIT TABS, Take 1 tablet by mouth daily., Disp: , Rfl:  .  Cholecalciferol (VITAMIN D3) 50 MCG (2000 UT) TABS, Take 2,000 Units by mouth daily. , Disp: , Rfl:  .  hydrochlorothiazide (HYDRODIURIL) 25 MG tablet, TAKE 1 TABLET BY MOUTH ONCE DAILY IN THE MORNING, Disp: 90 tablet, Rfl: 2 .  metoprolol succinate (TOPROL-XL) 50 MG 24 hr tablet, Take 1 tablet (50 mg total) by mouth daily. Take with or immediately following a meal., Disp: 90 tablet, Rfl: 2 .  nitroGLYCERIN (NITROSTAT) 0.4 MG SL  tablet, Place 1 tablet (0.4 mg total) under the tongue every 5 (five) minutes as needed for chest pain. If you require more than two tablets five minutes apart go to the nearest ER via EMS., Disp: 30 tablet, Rfl: 1 .  omeprazole (PRILOSEC) 20 MG capsule, Take 1 capsule by mouth daily as needed., Disp: , Rfl:  .  potassium chloride SA (KLOR-CON) 20 MEQ tablet, Take 1 tablet (20 mEq total) by mouth daily as needed (With water pill (torsemide))., Disp: 90 tablet, Rfl: 2 .  pravastatin (PRAVACHOL) 80 MG tablet, Take 1 tablet (80 mg total) by mouth daily., Disp: 90 tablet, Rfl: 2 .  torsemide (DEMADEX) 10 MG tablet, Take 1 tablet (10 mg total) by mouth daily as needed (Swelling, Shortness or breath)., Disp: 90 tablet, Rfl: 2 .  traMADol (ULTRAM) 50 MG tablet, 1 tablet daily as needed., Disp: , Rfl:   No orders of the defined types were placed in this encounter.  There are no Patient Instructions on file for this visit.  --Continue cardiac medications as reconciled in final medication list. --Return in about 3 months (around 12/15/2020) for Follow up, heart failure management.. Or sooner if needed. --Continue follow-up with your primary care physician  regarding the management of your other chronic comorbid conditions.  Patient's questions and concerns were addressed to her satisfaction. She voices understanding of the instructions provided during this encounter.   This note was created using a voice recognition software as a result there may be grammatical errors inadvertently enclosed that do not reflect the nature of this encounter. Every attempt is made to correct such errors.  Total time spent: 32 minutes greater than 50% of time spent face-to-face discussing the management of congestive heart failure and coronary artery disease with medication changes.  [name], [place], [facility]  Pager: [phone] Office: [phone]

## 2020-09-15 ENCOUNTER — Encounter: Payer: Self-pay | Admitting: Cardiology

## 2020-09-15 ENCOUNTER — Ambulatory Visit: Payer: 59 | Admitting: Cardiology

## 2020-09-15 ENCOUNTER — Other Ambulatory Visit: Payer: Self-pay

## 2020-09-15 VITALS — BP 152/86 | HR 57 | Temp 98.0°F | Resp 16 | Ht 63.0 in | Wt 148.4 lb

## 2020-09-15 DIAGNOSIS — M7989 Other specified soft tissue disorders: Secondary | ICD-10-CM

## 2020-09-15 DIAGNOSIS — I5031 Acute diastolic (congestive) heart failure: Secondary | ICD-10-CM

## 2020-09-15 DIAGNOSIS — I2510 Atherosclerotic heart disease of native coronary artery without angina pectoris: Secondary | ICD-10-CM

## 2020-09-15 DIAGNOSIS — E782 Mixed hyperlipidemia: Secondary | ICD-10-CM

## 2020-09-15 DIAGNOSIS — Z87891 Personal history of nicotine dependence: Secondary | ICD-10-CM

## 2020-09-15 DIAGNOSIS — I10 Essential (primary) hypertension: Secondary | ICD-10-CM

## 2020-09-21 ENCOUNTER — Other Ambulatory Visit: Payer: Self-pay

## 2020-09-21 ENCOUNTER — Ambulatory Visit
Admission: RE | Admit: 2020-09-21 | Discharge: 2020-09-21 | Disposition: A | Discharge status: Home / Self Care | Payer: 59 | Source: Ambulatory Visit | Attending: Internal Medicine | Admitting: Internal Medicine

## 2020-09-21 DIAGNOSIS — Z1231 Encounter for screening mammogram for malignant neoplasm of breast: Secondary | ICD-10-CM

## 2020-11-08 ENCOUNTER — Telehealth: Payer: Self-pay

## 2020-11-08 NOTE — Telephone Encounter (Signed)
What is her contact information?

## 2020-11-08 NOTE — Telephone Encounter (Signed)
Patient cousin Velna Hatchet called and is requesting you give her a call, regarding information on patients living arrangements and new pharmacy. She stated that she spoke with you and you advised her to call back with this information.

## 2020-11-09 ENCOUNTER — Telehealth: Payer: Self-pay

## 2020-11-09 NOTE — Telephone Encounter (Signed)
error 

## 2020-11-09 NOTE — Telephone Encounter (Signed)
[phone] Noralee Space

## 2020-11-23 ENCOUNTER — Other Ambulatory Visit: Payer: Self-pay

## 2020-11-23 DIAGNOSIS — I5031 Acute diastolic (congestive) heart failure: Secondary | ICD-10-CM

## 2020-11-23 DIAGNOSIS — R9439 Abnormal result of other cardiovascular function study: Secondary | ICD-10-CM

## 2020-11-23 DIAGNOSIS — I2510 Atherosclerotic heart disease of native coronary artery without angina pectoris: Secondary | ICD-10-CM

## 2020-11-23 DIAGNOSIS — M7989 Other specified soft tissue disorders: Secondary | ICD-10-CM

## 2020-11-23 DIAGNOSIS — E782 Mixed hyperlipidemia: Secondary | ICD-10-CM

## 2020-11-23 MED ORDER — POTASSIUM CHLORIDE CRYS ER 20 MEQ PO TBCR: 20.0000 meq | EXTENDED_RELEASE_TABLET | Freq: Every day | ORAL | 2 refills | Status: DC | PRN

## 2020-11-23 MED ORDER — ASPIRIN EC 81 MG PO TBEC: 81.0000 mg | DELAYED_RELEASE_TABLET | Freq: Every day | ORAL | 3 refills | Status: AC

## 2020-11-23 MED ORDER — PRAVASTATIN SODIUM 80 MG PO TABS: 80.0000 mg | ORAL_TABLET | Freq: Every day | ORAL | 2 refills | Status: DC

## 2020-11-23 MED ORDER — METOPROLOL SUCCINATE ER 50 MG PO TB24: 50.0000 mg | ORAL_TABLET | Freq: Every day | ORAL | 3 refills | Status: DC

## 2020-11-23 MED ORDER — NITROGLYCERIN 0.4 MG SL SUBL: 0.4000 mg | SUBLINGUAL_TABLET | SUBLINGUAL | 3 refills | Status: DC | PRN

## 2020-11-23 MED ORDER — HYDROCHLOROTHIAZIDE 25 MG PO TABS: ORAL_TABLET | ORAL | 3 refills | Status: DC

## 2020-11-23 MED ORDER — BENAZEPRIL HCL 40 MG PO TABS: 40.0000 mg | ORAL_TABLET | Freq: Every day | ORAL | 3 refills | Status: DC

## 2020-11-23 MED ORDER — TORSEMIDE 10 MG PO TABS: 10.0000 mg | ORAL_TABLET | Freq: Every day | ORAL | 2 refills | Status: DC | PRN

## 2020-11-24 ENCOUNTER — Other Ambulatory Visit: Payer: Self-pay

## 2020-11-24 DIAGNOSIS — I5031 Acute diastolic (congestive) heart failure: Secondary | ICD-10-CM

## 2020-11-24 DIAGNOSIS — M7989 Other specified soft tissue disorders: Secondary | ICD-10-CM

## 2020-11-24 MED ORDER — POTASSIUM CHLORIDE CRYS ER 20 MEQ PO TBCR: 20.0000 meq | EXTENDED_RELEASE_TABLET | Freq: Every day | ORAL | 2 refills | Status: DC | PRN

## 2020-11-25 ENCOUNTER — Other Ambulatory Visit: Payer: Self-pay

## 2020-11-25 MED ORDER — POTASSIUM CHLORIDE CRYS ER 20 MEQ PO TBCR: 20.0000 meq | EXTENDED_RELEASE_TABLET | Freq: Every day | ORAL | 0 refills | Status: DC | PRN

## 2020-11-30 ENCOUNTER — Other Ambulatory Visit: Payer: Self-pay

## 2020-11-30 MED ORDER — POTASSIUM CHLORIDE CRYS ER 20 MEQ PO TBCR: 20.0000 meq | EXTENDED_RELEASE_TABLET | Freq: Every day | ORAL | 0 refills | Status: DC | PRN

## 2020-12-15 ENCOUNTER — Ambulatory Visit: Payer: 59 | Admitting: Cardiology

## 2020-12-15 ENCOUNTER — Other Ambulatory Visit: Payer: Self-pay

## 2020-12-15 ENCOUNTER — Encounter: Payer: Self-pay | Admitting: Cardiology

## 2020-12-15 VITALS — BP 137/87 | HR 56 | Temp 98.0°F | Resp 17 | Ht 63.0 in | Wt 147.6 lb

## 2020-12-15 DIAGNOSIS — I10 Essential (primary) hypertension: Secondary | ICD-10-CM

## 2020-12-15 DIAGNOSIS — I5031 Acute diastolic (congestive) heart failure: Secondary | ICD-10-CM

## 2020-12-15 DIAGNOSIS — I2510 Atherosclerotic heart disease of native coronary artery without angina pectoris: Secondary | ICD-10-CM

## 2020-12-15 DIAGNOSIS — Z87891 Personal history of nicotine dependence: Secondary | ICD-10-CM

## 2020-12-15 DIAGNOSIS — M7989 Other specified soft tissue disorders: Secondary | ICD-10-CM

## 2020-12-15 DIAGNOSIS — E782 Mixed hyperlipidemia: Secondary | ICD-10-CM

## 2020-12-15 NOTE — Progress Notes (Signed)
ID:  Tanya Gray, DOB [birth year], MRN [id number]  PCP:  Tanya Margarita, DO  Cardiologist: Tanya Kras, DO, [facility] (established care 01/04/2020)  Date: 12/15/20 Last Office Visit: 09/15/2020  Chief Complaint  Patient presents with   Follow-up    3 month   Acute heart failure with preserved ejection fraction (HFpEF    HPI  Tanya Gray is a 75 y.o. female who presents to the office with a chief complaint of " 3 month follow-up for congestive heart failure management." Patient's past medical history and cardiovascular risk factors include: Hypertension, Hyperlipidemia, chronic HFpEF/stage B/NYHA class II, coronary artery disease without angina pectoris, former smoker.   Patient presents with one of her church members Tanya Gray at today's office visit. She provides verbal consent in regards to discussing her medical information in her presence.  Patient states that Tanya Gray [phone] lives in [place] has the patient's power of attorney.  Patient was originally referred to the office at the request of her primary care provider for evaluation and management of chest pain and lower extremity swelling.  She was noted to have an abnormal stress test and underwent a left heart catheterization which noted a CTO of the LCx with collateral circulation present.  No interventions were performed and medical therapy was recommended.  Since then her medications have been uptitrated in a stepwise fashion.  However, given her underlying cognitive impairment/dementia based on clinical judgment medication compliance has been a struggle over the last several months.  She has been followed up closely to prevent heart failure hospitalizations.  Since last office visit patient states that she is doing well from a cardiovascular standpoint.  However, does not recall which medications she is taking on a daily basis.  Unable to do medication reconciliation.  We did try to set up pill pack service for  her but appears that she is not taking it for reasons unknown.  Patient verbally admits that she needs help with taking care of herself as she has noticed she is getting more forgetful.  She denies any chest pain at rest or with effort related activities.  FUNCTIONAL STATUS: No structured exercise program or daily routine.   ALLERGIES: No Known Allergies  MEDICATION LIST PRIOR TO VISIT: Current Meds  Medication Sig   acetaminophen (TYLENOL) 500 MG tablet Take 1,000 mg by mouth every 6 (six) hours as needed for mild pain or headache.   aspirin EC 81 MG tablet Take 1 tablet (81 mg total) by mouth daily for 360 doses. Swallow whole.   benazepril (LOTENSIN) 40 MG tablet Take 1 tablet (40 mg total) by mouth daily.   Calcium Carbonate-Vitamin D (CALCIUM-VITAMIN D) 600-125 MG-UNIT TABS Take 1 tablet by mouth daily.   hydrochlorothiazide (HYDRODIURIL) 25 MG tablet TAKE 1 TABLET BY MOUTH ONCE DAILY IN THE MORNING   metoprolol succinate (TOPROL-XL) 50 MG 24 hr tablet Take 1 tablet (50 mg total) by mouth daily. Take with or immediately following a meal.   nitroGLYCERIN (NITROSTAT) 0.4 MG SL tablet Place 1 tablet (0.4 mg total) under the tongue every 5 (five) minutes as needed for chest pain. If you require more than two tablets five minutes apart go to the nearest ER via EMS.   omeprazole (PRILOSEC) 20 MG capsule Take 1 capsule by mouth daily as needed.   potassium chloride SA (KLOR-CON) 20 MEQ tablet Take 1 tablet (20 mEq total) by mouth daily as needed. As needed , take with fluid pill torsemide   pravastatin (PRAVACHOL) 80  MG tablet Take 1 tablet (80 mg total) by mouth daily.   torsemide (DEMADEX) 10 MG tablet Take 1 tablet (10 mg total) by mouth daily as needed (Swelling, Shortness or breath).   traMADol (ULTRAM) 50 MG tablet 1 tablet daily as needed.     PAST MEDICAL HISTORY: Past Medical History:  Diagnosis Date   AORTIC INSUFFICIENCY 08/03/2009   Cardiomegaly 01/29/2007   CHF (congestive  heart failure) (HCC)    Coronary artery disease    Diverticulosis of colon (without mention of hemorrhage) 08/08/2006   ESOPHAGEAL STRICTURE 02/19/2008   Heartburn 02/19/2008   HYPERLIPIDEMIA 02/18/2008   HYPERTENSION, BENIGN SYSTEMIC 08/08/2006   MYALGIA 01/29/2007   OSTEOARTHRITIS 12/21/2008   PEPTIC ULCER DISEASE 12/21/2008   RHINITIS, ALLERGIC 08/08/2006   Unspecified asthma(493.90) 08/08/2006    PAST SURGICAL HISTORY: Past Surgical History:  Procedure Laterality Date   ABDOMINAL HYSTERECTOMY     CARDIAC CATHETERIZATION     ENDOTRACHEAL INTUBATION EMERGENT     ESOPHAGOGASTRODUODENOSCOPY     LEFT HEART CATH AND CORONARY ANGIOGRAPHY N/A 02/23/2020   Procedure: LEFT HEART CATH AND CORONARY ANGIOGRAPHY;  Surgeon: Tanya Mormon, MD;  Location: [facility];  Service: Cardiovascular;  Laterality: N/A;    FAMILY HISTORY: The patient family history includes Aneurysm in her sister; Diabetes in her mother; Hypertension in her mother; Lung disease in her mother.  SOCIAL HISTORY:  The patient  reports that she quit smoking about 17 years ago. Her smoking use included cigarettes. She has a 15.00 pack-year smoking history. She has never used smokeless tobacco. She reports current alcohol use of about 1.0 - 2.0 standard drink of alcohol per week. She reports that she does not use drugs.  REVIEW OF SYSTEMS: Review of Systems  Constitutional: Negative for chills and fever.  HENT:  Negative for hoarse voice and nosebleeds.   Eyes:  Negative for discharge, double vision and pain.  Cardiovascular:  Positive for dyspnea on exertion (Chronic and stable) and leg swelling (Stable). Negative for chest pain, near-syncope, orthopnea, palpitations, paroxysmal nocturnal dyspnea and syncope.  Respiratory:  Negative for hemoptysis and shortness of breath.   Musculoskeletal:  Negative for muscle cramps and myalgias.  Gastrointestinal:  Negative for abdominal pain, constipation, diarrhea, hematemesis,  hematochezia, melena, nausea and vomiting.  Neurological:  Negative for dizziness and light-headedness.  PHYSICAL EXAM: Vitals with BMI 12/15/2020 12/15/2020 09/15/2020  Height - 5' 3"  5' 3"   Weight - 147 lbs 10 oz 148 lbs 6 oz  BMI - 92.01 00.71  Systolic 219 758 832  Diastolic 87 85 86  Pulse 56 66 57    CONSTITUTIONAL: Well-developed and well-nourished. No acute distress.  SKIN: Skin is warm and dry. No rash noted. No cyanosis. No pallor. No jaundice HEAD: Normocephalic and atraumatic.  EYES: No scleral icterus MOUTH/THROAT: Moist oral membranes.  NECK: No JVD present. No thyromegaly noted. No carotid bruits  LYMPHATIC: No visible cervical adenopathy.  CHEST Normal respiratory effort. No intercostal retractions  LUNGS: Clear to auscultation bilaterally.  No stridor. No wheezes. No rales.  CARDIOVASCULAR: Regular, positive P4-D8, soft holosystolic murmur heard at the apex, soft systolic ejection murmur, no gallops or rubs. ABDOMINAL: No apparent ascites.  EXTREMITIES: 1+ bilateral pitting edema noted in the lower extremities up to the midshin level.  Pulses were difficult to palpate. HEMATOLOGIC: No significant bruising NEUROLOGIC: Oriented to person, place, and time. Nonfocal. Normal muscle tone.  PSYCHIATRIC: Normal mood and affect. Normal behavior. Cooperative  CARDIAC DATABASE: EKG: 07/18/2020: Sinus Rhythm,  65bpm, LAE, normal axis, without underlying injury pattern.   Echocardiogram: 01/08/2020: Normal LV systolic function with visual EF 65-70%. Left ventricle cavity is normal in size. Mild left ventricular hypertrophy. Normal global wall motion. Doppler evidence of grade II diastolic dysfunction, elevated LAP. Calculated EF 68%. Left atrial cavity is mildly dilated.Mild (Grade I) aortic regurgitation.Mild (Grade I) mitral regurgitation.Mild tricuspid regurgitation. No evidence of pulmonary hypertension. RVSP measures 30 mmHg.Moderate pulmonic regurgitation.Trivial pericardial  effusion with no hemodynamic compromise.No prior study for comparison.  Stress Testing: Lexiscan Tetrofosmin Stress Test  02/08/2020: 2 day protocol.  Nondiagnostic ECG stress due to pharmacologic stress with Lexiscan. Non specific ST segment change noted in stress in inferolateral leads.  Myocardial perfusion is abnormal.  There is a reversible moderate defect in the lateral and apical regions.  Overall LV systolic function is abnormal with regional wall motion abnormalities. Stress LV EF: 44%  with hypokinesis in the same region.  No previous exam available for comparison. Intermediate risk study.   Heart Catheterization: 02/23/2020: LM: Normal LAD: Medial calcification with minimal luminal irregularities. LCx: Flush CTO. calcified vessel. Grade 2 left-to-left and right-to-left collaterals from LAD and RCA RCA: Minimal luminal irregularities. LVEDP normal  Lower Extremity Arterial Duplex  01/08/2020:  No hemodynamically significant stenoses are identified in the bilateral lower extremity arterial system.  This exam reveals normal perfusion of the right lower extremity (ABI 1.01) and mildly decreased perfusion of the left lower extremity, noted at the post tibial artery level (ABI 0.95).   Lower Extremity Venous Duplex  Bilateral 01/29/2020:  No evidence of deep vein thrombosis of the lower extremities with normal venous return.  Generalized tissue edema noted.   LABORATORY DATA: External Labs: Collected: 12/11/2019 Creatinine 0.8 mg/dL. eGFR: 85 mL/min per 1.73 m Lipid profile: Total cholesterol 216, triglycerides 41, HDL 63, LDL 145, non-HDL 153 TSH: 0.34 and free T4 1.1 ng/mL  CMP Latest Ref Rng & Units 09/09/2020 07/28/2020 07/11/2020  Glucose 65 - 99 mg/dL 102(H) 89 95  BUN 8 - 27 mg/dL 25 19 11   Creatinine 0.57 - 1.00 mg/dL 1.22(H) 1.06(H) 1.21(H)  Sodium 134 - 144 mmol/L 142 145(H) 145(H)  Potassium 3.5 - 5.2 mmol/L 4.0 4.3 5.0  Chloride 96 - 106 mmol/L 101 103 101  CO2 20  - 29 mmol/L 22 26 28   Calcium 8.7 - 10.3 mg/dL 9.8 9.5 9.5  Total Protein 6.0 - 8.3 g/dL - - -  Total Bilirubin 0.2 - 1.2 mg/dL - - -  Alkaline Phos 39 - 117 U/L - - -  AST 0 - 37 U/L - - -  ALT 0 - 35 U/L - - -   Lipid Panel     Component Value Date/Time   CHOL 226 (H) 05/29/2019 1029   TRIG 58.0 05/29/2019 1029   HDL 85.00 05/29/2019 1029   CHOLHDL 3 05/29/2019 1029   VLDL 11.6 05/29/2019 1029   LDLCALC 130 (H) 05/29/2019 1029    No components found for: NTPROBNP Recent Labs    01/05/20 1158 03/24/20 1137 07/11/20 1012 07/28/20 1144 09/09/20 1057  PROBNP 524* 251 853* 310* 192   No results for input(s): TSH in the last 8760 hours.  HEMOGLOBIN A1C Lab Results  Component Value Date   HGBA1C 5.8 07/21/2008   IMPRESSION:    ICD-10-CM   1. Acute heart failure with preserved ejection fraction (HFpEF) (HCC)  I50.31 Pro b natriuretic peptide (BNP)    Basic metabolic panel    Magnesium    2. Atherosclerosis of  native coronary artery of native heart without angina pectoris  I25.10     3. Benign hypertension  I10     4. Mixed hyperlipidemia  E78.2     5. Leg swelling  M79.89     6. Former smoker  Z87.891        RECOMMENDATIONS: Tanya Gray is a 75 y.o. female whose past medical history and cardiac risk factors include: Hypertension, Hyperlipidemia, acute HFpEF/stage B/NYHA class III, former smoker.   Acute on chronic heart failure with preserved EF, stage B, NYHA class III: Appears to be volume overloaded but not in overt heart failure. Difficult to uptitrate GDMT as she does not recall her home medications and has not brought her medication bottles with her. Patient's friend Darryll Capers will be dropping and later today or tomorrow with the medication she is currently taking at home I will make further recommendations. Labs prior to the next office visit. Educated on the importance of a low-salt diet. Patient's care has becoming challenging given her  underlying dementia/cognitive impairment which may be contributing to her medication noncompliance and following directions. I encouraged the patient to discuss her goals of care with her family and next of kin to get assistance with regards to coordinating her medications and taking them on time. I will reach out to her primary care provider to voice my concerns given her underlying cognitive impairment/questionable dementia.  Patient states that she has an upcoming office visit with her PCP on the coming Monday.  Established coronary disease within native heart, without angina pectoris: CTO of the LCx with collaterals.  Currently asymptomatic with regards to chest pain or anginal discomfort.   Recommended medical therapy for now unless if she becomes symptomatic.   Of note, due to underlying dementia/cognitive impairment dual antiplatelet therapy compliance if not followed will predispose her to higher risk than benefit from coronary interventions.   Educated her on the importance of improving her cardiovascular risk factors.  Benign essential hypertension:  Education importance of medication compliance. Low-salt diet recommended. Patient is asked to keep a log of her blood pressures and to bring them at the next office visit.  Mixed hyperlipidemia: Currently on statin therapy.  Currently managed by primary care provider.  FINAL MEDICATION LIST END OF ENCOUNTER: No orders of the defined types were placed in this encounter.   Medications Discontinued During This Encounter  Medication Reason   Cholecalciferol (VITAMIN D3) 50 MCG (2000 UT) TABS Error     Current Outpatient Medications:    acetaminophen (TYLENOL) 500 MG tablet, Take 1,000 mg by mouth every 6 (six) hours as needed for mild pain or headache., Disp: , Rfl:    aspirin EC 81 MG tablet, Take 1 tablet (81 mg total) by mouth daily for 360 doses. Swallow whole., Disp: 90 tablet, Rfl: 3   benazepril (LOTENSIN) 40 MG tablet, Take 1  tablet (40 mg total) by mouth daily., Disp: 90 tablet, Rfl: 3   Calcium Carbonate-Vitamin D (CALCIUM-VITAMIN D) 600-125 MG-UNIT TABS, Take 1 tablet by mouth daily., Disp: , Rfl:    hydrochlorothiazide (HYDRODIURIL) 25 MG tablet, TAKE 1 TABLET BY MOUTH ONCE DAILY IN THE MORNING, Disp: 90 tablet, Rfl: 3   metoprolol succinate (TOPROL-XL) 50 MG 24 hr tablet, Take 1 tablet (50 mg total) by mouth daily. Take with or immediately following a meal., Disp: 90 tablet, Rfl: 3   nitroGLYCERIN (NITROSTAT) 0.4 MG SL tablet, Place 1 tablet (0.4 mg total) under the tongue every 5 (five) minutes as  needed for chest pain. If you require more than two tablets five minutes apart go to the nearest ER via EMS., Disp: 30 tablet, Rfl: 3   omeprazole (PRILOSEC) 20 MG capsule, Take 1 capsule by mouth daily as needed., Disp: , Rfl:    potassium chloride SA (KLOR-CON) 20 MEQ tablet, Take 1 tablet (20 mEq total) by mouth daily as needed. As needed , take with fluid pill torsemide, Disp: 90 tablet, Rfl: 0   pravastatin (PRAVACHOL) 80 MG tablet, Take 1 tablet (80 mg total) by mouth daily., Disp: 90 tablet, Rfl: 2   torsemide (DEMADEX) 10 MG tablet, Take 1 tablet (10 mg total) by mouth daily as needed (Swelling, Shortness or breath)., Disp: 90 tablet, Rfl: 2   traMADol (ULTRAM) 50 MG tablet, 1 tablet daily as needed., Disp: , Rfl:   Orders Placed This Encounter  Procedures   Pro b natriuretic peptide (BNP)   Basic metabolic panel   Magnesium    There are no Patient Instructions on file for this visit.  --Continue cardiac medications as reconciled in final medication list. --Return in about 3 months (around 03/24/2021) for Follow up, heart failure management.. Or sooner if needed. --Continue follow-up with your primary care physician regarding the management of your other chronic comorbid conditions.  Patient's questions and concerns were addressed to her satisfaction. She voices understanding of the instructions provided  during this encounter.   This note was created using a voice recognition software as a result there may be grammatical errors inadvertently enclosed that do not reflect the nature of this encounter. Every attempt is made to correct such errors.  Total time spent: 40 minutes greater than 50% of time spent face-to-face discussing the management of congestive heart failure and coronary artery disease with medication changes.  Spoke to the patient's friend Darryll Capers, called her primary care office at [phone] (however, unable to reach them).  Tanya Gray, [place], [facility]  Pager: [phone] Office: [phone]

## 2020-12-19 ENCOUNTER — Other Ambulatory Visit: Payer: Self-pay

## 2020-12-19 DIAGNOSIS — I5031 Acute diastolic (congestive) heart failure: Secondary | ICD-10-CM

## 2021-01-02 ENCOUNTER — Encounter: Payer: Self-pay | Admitting: Gastroenterology

## 2021-01-29 ENCOUNTER — Other Ambulatory Visit: Payer: Self-pay | Admitting: Cardiology

## 2021-03-18 LAB — BASIC METABOLIC PANEL
BUN/Creatinine Ratio: 18 (ref 12–28)
BUN: 22 mg/dL (ref 8–27)
CO2: 25 mmol/L (ref 20–29)
Calcium: 10.2 mg/dL (ref 8.7–10.3)
Chloride: 93 mmol/L — ABNORMAL LOW (ref 96–106)
Creatinine, Ser: 1.24 mg/dL — ABNORMAL HIGH (ref 0.57–1.00)
Glucose: 84 mg/dL (ref 70–99)
Potassium: 4.2 mmol/L (ref 3.5–5.2)
Sodium: 137 mmol/L (ref 134–144)
eGFR: 46 mL/min/{1.73_m2} — ABNORMAL LOW (ref >59)

## 2021-03-18 LAB — MAGNESIUM: Magnesium: 1.9 mg/dL (ref 1.6–2.3)

## 2021-03-18 LAB — PRO B NATRIURETIC PEPTIDE: NT-Pro BNP: 149 pg/mL (ref 0–301)

## 2021-03-21 NOTE — Progress Notes (Signed)
Patient is aware of message

## 2021-03-24 ENCOUNTER — Ambulatory Visit: Payer: Medicare Other | Admitting: Cardiology

## 2021-04-12 ENCOUNTER — Ambulatory Visit: Payer: Medicaid Other | Admitting: Cardiology

## 2021-04-12 ENCOUNTER — Other Ambulatory Visit: Payer: Self-pay

## 2021-04-12 ENCOUNTER — Encounter: Payer: Self-pay | Admitting: Cardiology

## 2021-04-12 VITALS — BP 139/82 | HR 59 | Temp 98.8°F | Resp 16 | Ht 63.0 in | Wt 131.0 lb

## 2021-04-12 DIAGNOSIS — I10 Essential (primary) hypertension: Secondary | ICD-10-CM

## 2021-04-12 DIAGNOSIS — M7989 Other specified soft tissue disorders: Secondary | ICD-10-CM

## 2021-04-12 DIAGNOSIS — I2510 Atherosclerotic heart disease of native coronary artery without angina pectoris: Secondary | ICD-10-CM

## 2021-04-12 DIAGNOSIS — Z87891 Personal history of nicotine dependence: Secondary | ICD-10-CM

## 2021-04-12 DIAGNOSIS — I5032 Chronic diastolic (congestive) heart failure: Secondary | ICD-10-CM

## 2021-04-12 DIAGNOSIS — E782 Mixed hyperlipidemia: Secondary | ICD-10-CM

## 2021-04-12 NOTE — Progress Notes (Signed)
ID:  Tanya Gray, DOB December 20, 1945, MRN [id number]  PCP:  [name], DO  Cardiologist: [name], DO, [place] (established care 01/04/2020)  Date: 04/12/21 Last Office Visit: 12/15/2020  Chief Complaint  Patient presents with   Acute heart failure with preserved ejection fraction    Follow-up    HPI  Tanya Gray is a 75 y.o. female who presents to the office with a chief complaint of " 3 month follow-up for congestive heart failure management." Patient's past medical history and cardiovascular risk factors include: Hypertension, Hyperlipidemia, chronic HFpEF/stage B/NYHA class II, coronary artery disease without angina pectoris, former smoker.   Patient presents with one of her church members [name] at today's office visit. She provides verbal consent in regards to discussing her medical information in her presence.  She wanted Lavella Lemons to make decisions for her if needed, but Lavella Lemons does not have POA.   Patient is being followed for management of heart failure with preserved EF.  No hospitalizations since last office visit.  She is currently on appropriate medical therapy; however, upon titration has been difficult due to her cognitive impairment/dementia.  Both the patient and Darryll Capers state that they are working with her PCP to have either home health care arranged or placement to nursing facility.  Patient states that she is also lost approximately 16 pounds since last office visit.  She has not had age-appropriate cancer screening as well.  She denies any chest pain or shortness of breath at rest or with effort related activities.  She is required 1 tablet of sublingual nitroglycerin over the last 3 months.  No hospitalizations or urgent care visits for cardiovascular symptoms.  She was initially enrolled into principal care management but has failed to follow through several times and would like to disenroll.  FUNCTIONAL STATUS: No structured exercise program or daily  routine.   ALLERGIES: No Known Allergies  MEDICATION LIST PRIOR TO VISIT: Current Meds  Medication Sig   acetaminophen (TYLENOL) 500 MG tablet Take 1,000 mg by mouth every 6 (six) hours as needed for mild pain or headache.   aspirin EC 81 MG tablet Take 1 tablet (81 mg total) by mouth daily for 360 doses. Swallow whole.   B Complex Vitamins (VITAMIN B COMPLEX) TABS Take 1 tablet by mouth daily.   benazepril (LOTENSIN) 40 MG tablet Take 1 tablet (40 mg total) by mouth daily.   Calcium Carbonate-Vitamin D (CALCIUM-VITAMIN D) 600-125 MG-UNIT TABS Take 1 tablet by mouth daily.   metoprolol succinate (TOPROL-XL) 50 MG 24 hr tablet Take 1 tablet (50 mg total) by mouth daily. Take with or immediately following a meal.   nitroGLYCERIN (NITROSTAT) 0.4 MG SL tablet Place 1 tablet (0.4 mg total) under the tongue every 5 (five) minutes as needed for chest pain. If you require more than two tablets five minutes apart go to the nearest ER via EMS.   omeprazole (PRILOSEC) 20 MG capsule Take 1 capsule by mouth daily as needed.   pravastatin (PRAVACHOL) 80 MG tablet Take 1 tablet (80 mg total) by mouth daily.   torsemide (DEMADEX) 10 MG tablet Take 1 tablet (10 mg total) by mouth daily as needed (Swelling, Shortness or breath). (Patient taking differently: Take 10 mg by mouth daily.)   traMADol (ULTRAM) 50 MG tablet 1 tablet daily as needed.   vitamin B-12 (CYANOCOBALAMIN) 1000 MCG tablet Take 1,000 mcg by mouth every morning.     PAST MEDICAL HISTORY: Past Medical History:  Diagnosis Date  AORTIC INSUFFICIENCY 08/03/2009   Cardiomegaly 01/29/2007   CHF (congestive heart failure) (HCC)    Coronary artery disease    Diverticulosis of colon (without mention of hemorrhage) 08/08/2006   ESOPHAGEAL STRICTURE 02/19/2008   Heartburn 02/19/2008   HYPERLIPIDEMIA 02/18/2008   HYPERTENSION, BENIGN SYSTEMIC 08/08/2006   MYALGIA 01/29/2007   OSTEOARTHRITIS 12/21/2008   PEPTIC ULCER DISEASE 12/21/2008   RHINITIS,  ALLERGIC 08/08/2006   Unspecified asthma(493.90) 08/08/2006    PAST SURGICAL HISTORY: Past Surgical History:  Procedure Laterality Date   ABDOMINAL HYSTERECTOMY     CARDIAC CATHETERIZATION     ENDOTRACHEAL INTUBATION EMERGENT     ESOPHAGOGASTRODUODENOSCOPY     LEFT HEART CATH AND CORONARY ANGIOGRAPHY N/A 02/23/2020   Procedure: LEFT HEART CATH AND CORONARY ANGIOGRAPHY;  Surgeon: Nigel Mormon, MD;  Location: [facility];  Service: Cardiovascular;  Laterality: N/A;    FAMILY HISTORY: The patient family history includes Aneurysm in her sister; Diabetes in her mother; Hypertension in her mother; Lung disease in her mother.  SOCIAL HISTORY:  The patient  reports that she quit smoking about 17 years ago. Her smoking use included cigarettes. She has a 15.00 pack-year smoking history. She has never used smokeless tobacco. She reports current alcohol use of about 1.0 - 2.0 standard drink per week. She reports that she does not use drugs.  REVIEW OF SYSTEMS: Review of Systems  Constitutional: Negative for chills and fever.  HENT:  Negative for hoarse voice and nosebleeds.   Eyes:  Negative for discharge, double vision and pain.  Cardiovascular:  Positive for dyspnea on exertion (Chronic and stable). Negative for chest pain, leg swelling, near-syncope, orthopnea, palpitations, paroxysmal nocturnal dyspnea and syncope.  Respiratory:  Negative for hemoptysis and shortness of breath.   Musculoskeletal:  Negative for muscle cramps and myalgias.  Gastrointestinal:  Negative for abdominal pain, constipation, diarrhea, hematemesis, hematochezia, melena, nausea and vomiting.  Neurological:  Negative for dizziness and light-headedness.  PHYSICAL EXAM: Vitals with BMI 04/12/2021 12/15/2020 12/15/2020  Height 5' 3" - 5' 3"  Weight 131 lbs - 147 lbs 10 oz  BMI 23.36 - 12.24  Systolic 497 530 051  Diastolic 82 87 85  Pulse 59 56 66    CONSTITUTIONAL: Well-developed and well-nourished. No  acute distress.  SKIN: Skin is warm and dry. No rash noted. No cyanosis. No pallor. No jaundice HEAD: Normocephalic and atraumatic.  EYES: No scleral icterus MOUTH/THROAT: Moist oral membranes.  NECK: No JVD present. No thyromegaly noted. No carotid bruits  LYMPHATIC: No visible cervical adenopathy.  CHEST Normal respiratory effort. No intercostal retractions  LUNGS: Clear to auscultation bilaterally.  No stridor. No wheezes. No rales.  CARDIOVASCULAR: Regular, positive T0-Y1, soft holosystolic murmur heard at the apex, soft systolic ejection murmur, no gallops or rubs. ABDOMINAL: No apparent ascites.  EXTREMITIES: 1+ bilateral pitting edema noted in the lower extremities up to the midshin level.  Pulses were difficult to palpate. HEMATOLOGIC: No significant bruising NEUROLOGIC: Oriented to person, place, and time. Nonfocal. Normal muscle tone.  PSYCHIATRIC: Normal mood and affect. Normal behavior. Cooperative  CARDIAC DATABASE: EKG: 04/12/2021: Sinus  Bradycardia, 52bpm, normal axis, without underlying injury pattern.   Echocardiogram: 01/08/2020: Normal LV systolic function with visual EF 65-70%. Left ventricle cavity is normal in size. Mild left ventricular hypertrophy. Normal global wall motion. Doppler evidence of grade II diastolic dysfunction, elevated LAP. Calculated EF 68%. Left atrial cavity is mildly dilated.Mild (Grade I) aortic regurgitation.Mild (Grade I) mitral regurgitation.Mild tricuspid regurgitation. No evidence of  pulmonary hypertension. RVSP measures 30 mmHg.Moderate pulmonic regurgitation.Trivial pericardial effusion with no hemodynamic compromise.No prior study for comparison.  Stress Testing: Lexiscan Tetrofosmin Stress Test  02/08/2020: 2 day protocol.  Nondiagnostic ECG stress due to pharmacologic stress with Lexiscan. Non specific ST segment change noted in stress in inferolateral leads.  Myocardial perfusion is abnormal.  There is a reversible moderate defect  in the lateral and apical regions.  Overall LV systolic function is abnormal with regional wall motion abnormalities. Stress LV EF: 44%  with hypokinesis in the same region.  No previous exam available for comparison. Intermediate risk study.   Heart Catheterization: 02/23/2020: LM: Normal LAD: Medial calcification with minimal luminal irregularities. LCx: Flush CTO. calcified vessel. Grade 2 left-to-left and right-to-left collaterals from LAD and RCA RCA: Minimal luminal irregularities. LVEDP normal  Lower Extremity Arterial Duplex  01/08/2020:  No hemodynamically significant stenoses are identified in the bilateral lower extremity arterial system.  This exam reveals normal perfusion of the right lower extremity (ABI 1.01) and mildly decreased perfusion of the left lower extremity, noted at the post tibial artery level (ABI 0.95).   Lower Extremity Venous Duplex  Bilateral 01/29/2020:  No evidence of deep vein thrombosis of the lower extremities with normal venous return.  Generalized tissue edema noted.   LABORATORY DATA: External Labs: Collected: 12/11/2019 Creatinine 0.8 mg/dL. eGFR: 85 mL/min per 1.73 m Lipid profile: Total cholesterol 216, triglycerides 41, HDL 63, LDL 145, non-HDL 153 TSH: 0.34 and free T4 1.1 ng/mL  CMP Latest Ref Rng & Units 03/17/2021 09/09/2020 07/28/2020  Glucose 70 - 99 mg/dL 84 102(H) 89  BUN 8 - 27 mg/dL _0 Creatinine 0.57 - 1.00 mg/dL 1.24(H) 1.22(H) 1.06(H)  Sodium 134 - 144 mmol/L 137 142 145(H)  Potassium 3.5 - 5.2 mmol/L 4.2 4.0 4.3  Chloride 96 - 106 mmol/L 93(L) 101 103  CO2 20 - 29 mmol/L _1 Calcium 8.7 - 10.3 mg/dL 10.2 9.8 9.5  Total Protein 6.0 - 8.3 g/dL - - -  Total Bilirubin 0.2 - 1.2 mg/dL - - -  Alkaline Phos 39 - 117 U/L - - -  AST 0 - 37 U/L - - -  ALT 0 - 35 U/L - - -   Lipid Panel     Component Value Date/Time   CHOL 226 (H) 05/29/2019 1029   TRIG 58.0 05/29/2019 1029   HDL 85.00 05/29/2019 1029   CHOLHDL 3  05/29/2019 1029   VLDL 11.6 05/29/2019 1029   LDLCALC 130 (H) 05/29/2019 1029    No components found for: NTPROBNP Recent Labs    07/11/20 1012 07/28/20 1144 09/09/20 1057 03/17/21 1037  PROBNP 853* 310* 192 149   No results for input(s): TSH in the last 8760 hours.  HEMOGLOBIN A1C Lab Results  Component Value Date   HGBA1C 5.8 07/21/2008   IMPRESSION:    ICD-10-CM   1. Chronic heart failure with preserved ejection fraction (HFpEF) (HCC)  I50.32 EKG 12-Lead    2. Atherosclerosis of native coronary artery of native heart without angina pectoris  I25.10     3. Benign hypertension  I10     4. Mixed hyperlipidemia  E78.2     5. Leg swelling  M79.89     6. Former smoker  Z87.891        RECOMMENDATIONS: Tanya Gray is a 75 y.o. female whose past medical history and cardiac risk factors include: Hypertension, Hyperlipidemia, acute HFpEF/stage B/NYHA class II, former smoker.  Chronic heart failure with preserved EF, stage B, NYHA class II: Euvolemic on physical examination. Weight loss since last visit.  However patient is encouraged to discuss this further with PCP and undergo age-appropriate cancer screening. Medications reconciled. Patient is unclear that she is not optimized on GDMT as she is currently not on medication such as [place] and [place].  The reason being that given her underlying dementia/cognitive impairment follow through and medication adherence and changes have been difficult to implement in the past.  We could reconsider this if she obtains home health care or is in a nursing facility. Most recent labs from 03/17/2021 independently reviewed.  NT proBNP within normal limits.  Established coronary disease within native heart, without angina pectoris: CTO of the LCx with collaterals.  Currently asymptomatic with regards to chest pain or anginal discomfort.   Recommended medical therapy for now unless if she becomes symptomatic.   Of note, due to  underlying dementia/cognitive impairment dual antiplatelet therapy compliance if not followed will predispose her to higher risk than benefit from coronary interventions.   Educated her on the importance of improving her cardiovascular risk factors.  Benign essential hypertension:  Education importance of medication compliance. Low-salt diet recommended. Patient is asked to keep a log of her blood pressures and to bring them at the next office visit.  Mixed hyperlipidemia:  Currently on statin therapy.   Currently managed by primary care provider.  FINAL MEDICATION LIST END OF ENCOUNTER: No orders of the defined types were placed in this encounter.   Medications Discontinued During This Encounter  Medication Reason   potassium chloride SA (KLOR-CON) 20 MEQ tablet Error   hydrochlorothiazide (HYDRODIURIL) 25 MG tablet Error     Current Outpatient Medications:    acetaminophen (TYLENOL) 500 MG tablet, Take 1,000 mg by mouth every 6 (six) hours as needed for mild pain or headache., Disp: , Rfl:    aspirin EC 81 MG tablet, Take 1 tablet (81 mg total) by mouth daily for 360 doses. Swallow whole., Disp: 90 tablet, Rfl: 3   B Complex Vitamins (VITAMIN B COMPLEX) TABS, Take 1 tablet by mouth daily., Disp: , Rfl:    benazepril (LOTENSIN) 40 MG tablet, Take 1 tablet (40 mg total) by mouth daily., Disp: 90 tablet, Rfl: 3   Calcium Carbonate-Vitamin D (CALCIUM-VITAMIN D) 600-125 MG-UNIT TABS, Take 1 tablet by mouth daily., Disp: , Rfl:    metoprolol succinate (TOPROL-XL) 50 MG 24 hr tablet, Take 1 tablet (50 mg total) by mouth daily. Take with or immediately following a meal., Disp: 90 tablet, Rfl: 3   nitroGLYCERIN (NITROSTAT) 0.4 MG SL tablet, Place 1 tablet (0.4 mg total) under the tongue every 5 (five) minutes as needed for chest pain. If you require more than two tablets five minutes apart go to the nearest ER via EMS., Disp: 30 tablet, Rfl: 3   omeprazole (PRILOSEC) 20 MG capsule, Take 1  capsule by mouth daily as needed., Disp: , Rfl:    pravastatin (PRAVACHOL) 80 MG tablet, Take 1 tablet (80 mg total) by mouth daily., Disp: 90 tablet, Rfl: 2   torsemide (DEMADEX) 10 MG tablet, Take 1 tablet (10 mg total) by mouth daily as needed (Swelling, Shortness or breath). (Patient taking differently: Take 10 mg by mouth daily.), Disp: 90 tablet, Rfl: 2   traMADol (ULTRAM) 50 MG tablet, 1 tablet daily as needed., Disp: , Rfl:    vitamin B-12 (CYANOCOBALAMIN) 1000 MCG tablet, Take 1,000 mcg by mouth every morning., Disp: ,  Rfl:   Orders Placed This Encounter  Procedures   EKG 12-Lead    There are no Patient Instructions on file for this visit.  --Continue cardiac medications as reconciled in final medication list. --Return in about 6 months (around 10/10/2021) for Follow up, heart failure management.. Or sooner if needed. --Continue follow-up with your primary care physician regarding the management of your other chronic comorbid conditions.  Patient's questions and concerns were addressed to her satisfaction. She voices understanding of the instructions provided during this encounter.   This note was created using a voice recognition software as a result there may be grammatical errors inadvertently enclosed that do not reflect the nature of this encounter. Every attempt is made to correct such errors.  [name], [place], [facility]  Pager: [phone] Office: [phone]

## 2021-04-13 ENCOUNTER — Other Ambulatory Visit: Payer: Self-pay | Admitting: Cardiology

## 2021-04-13 DIAGNOSIS — R9439 Abnormal result of other cardiovascular function study: Secondary | ICD-10-CM

## 2021-08-02 ENCOUNTER — Other Ambulatory Visit: Payer: Self-pay | Admitting: Cardiology

## 2021-08-02 DIAGNOSIS — I5031 Acute diastolic (congestive) heart failure: Secondary | ICD-10-CM

## 2021-08-02 DIAGNOSIS — M7989 Other specified soft tissue disorders: Secondary | ICD-10-CM

## 2021-08-02 DIAGNOSIS — I2510 Atherosclerotic heart disease of native coronary artery without angina pectoris: Secondary | ICD-10-CM

## 2021-08-02 DIAGNOSIS — E782 Mixed hyperlipidemia: Secondary | ICD-10-CM

## 2021-08-08 DIAGNOSIS — I10 Essential (primary) hypertension: Secondary | ICD-10-CM | POA: Diagnosis not present

## 2021-08-08 DIAGNOSIS — K219 Gastro-esophageal reflux disease without esophagitis: Secondary | ICD-10-CM | POA: Diagnosis not present

## 2021-08-08 DIAGNOSIS — E785 Hyperlipidemia, unspecified: Secondary | ICD-10-CM | POA: Diagnosis not present

## 2021-08-15 ENCOUNTER — Other Ambulatory Visit: Payer: Self-pay | Admitting: Internal Medicine

## 2021-08-15 DIAGNOSIS — Z1231 Encounter for screening mammogram for malignant neoplasm of breast: Secondary | ICD-10-CM

## 2021-09-22 ENCOUNTER — Ambulatory Visit
Admission: RE | Admit: 2021-09-22 | Discharge: 2021-09-22 | Disposition: A | Discharge status: Home / Self Care | Payer: PRIVATE HEALTH INSURANCE | Source: Ambulatory Visit | Attending: Internal Medicine | Admitting: Internal Medicine

## 2021-09-22 DIAGNOSIS — Z1231 Encounter for screening mammogram for malignant neoplasm of breast: Secondary | ICD-10-CM

## 2021-09-25 ENCOUNTER — Other Ambulatory Visit: Payer: Self-pay | Admitting: Internal Medicine

## 2021-09-25 DIAGNOSIS — R928 Other abnormal and inconclusive findings on diagnostic imaging of breast: Secondary | ICD-10-CM

## 2021-10-11 ENCOUNTER — Encounter: Payer: Self-pay | Admitting: Cardiology

## 2021-10-11 ENCOUNTER — Ambulatory Visit: Payer: Medicare (Managed Care) | Admitting: Cardiology

## 2021-10-11 VITALS — BP 140/76 | HR 91 | Temp 98.0°F | Resp 16 | Ht 63.0 in | Wt 133.0 lb

## 2021-10-11 DIAGNOSIS — Z87891 Personal history of nicotine dependence: Secondary | ICD-10-CM | POA: Diagnosis not present

## 2021-10-11 DIAGNOSIS — I5032 Chronic diastolic (congestive) heart failure: Secondary | ICD-10-CM | POA: Diagnosis not present

## 2021-10-11 DIAGNOSIS — E782 Mixed hyperlipidemia: Secondary | ICD-10-CM | POA: Diagnosis not present

## 2021-10-11 DIAGNOSIS — I447 Left bundle-branch block, unspecified: Secondary | ICD-10-CM | POA: Diagnosis not present

## 2021-10-11 DIAGNOSIS — I10 Essential (primary) hypertension: Secondary | ICD-10-CM | POA: Diagnosis not present

## 2021-10-11 DIAGNOSIS — I2510 Atherosclerotic heart disease of native coronary artery without angina pectoris: Secondary | ICD-10-CM

## 2021-10-11 NOTE — Progress Notes (Signed)
? ?ID:  Tanya Gray, DOB 20-Oct-1945, MRN [id number] ? ?PCP:  [name], DO  ?Cardiologist: [name], DO, [facility] (established care 01/04/2020) ? ?Date: 10/11/21 ?Last Office Visit: 04/12/2021 ? ?Chief Complaint  ?Patient presents with  ? Congestive Heart Failure  ? Chronic heart failure with preserved ejection fraction  ? Follow-up  ?  6 month  ? ? ?HPI  ?Tanya Gray is a 76 y.o. female whose past medical history and cardiovascular risk factors include: Hypertension, Hyperlipidemia, chronic HFpEF/stage B/NYHA class II, coronary artery disease without angina pectoris, left bundle branch block, former smoker.  ? ?Patient presents with one of her church members [name] at today's office visit. She provides verbal consent in regards to discussing her medical information in her presence.  She wanted Lavella Lemons to make decisions for her if needed, but Lavella Lemons does not have POA.  ? ?She is being followed by the practice for management of HFpEF.  She has not had any hospitalizations for congestive heart failure since last 6 months.  She is currently on appropriate medical therapy but titration has been difficult due to her cognitive impairment/dementia.  She never brings her medication bottles in for review nor does she know them with regards to reconciliation.  Patient has very poor insight into chronic comorbid conditions.  Patient states that she is trying to be placed into memory care unit but is awaiting on a POA. ? ?She denies angina pectoris or heart failure symptoms.  Her weight remains relatively stable since last office visit.  I explained to her multiple times that we have not uptitrate GDMT as that she does not take medications as directed or has timely lab work.  If she does get placed in the memory care unit I will slowly uptitrate GDMT to the maximum tolerated doses.  EKG today notes newly discovered left bundle branch block.  Patient is asymptomatic. ? ?FUNCTIONAL STATUS: ?No structured exercise  program or daily routine.  ? ?ALLERGIES: ?No Known Allergies ? ?MEDICATION LIST PRIOR TO VISIT: ?Current Meds  ?Medication Sig  ? acetaminophen (TYLENOL) 500 MG tablet Take 1,000 mg by mouth every 6 (six) hours as needed for mild pain or headache.  ? aspirin EC 81 MG tablet Take 1 tablet (81 mg total) by mouth daily for 360 doses. Swallow whole.  ? B Complex Vitamins (VITAMIN B COMPLEX) TABS Take 1 tablet by mouth daily.  ? benazepril (LOTENSIN) 40 MG tablet TAKE ONE TABLET BY MOUTH DAILY  ? Calcium Carbonate-Vitamin D (CALCIUM-VITAMIN D) 600-125 MG-UNIT TABS Take 1 tablet by mouth daily.  ? metoprolol succinate (TOPROL-XL) 50 MG 24 hr tablet TAKE ONE TABLET BY MOUTH ONCE DAILY  ? nitroGLYCERIN (NITROSTAT) 0.4 MG SL tablet Dissolve 1 tab under tongue as needed for chest pain. May repeat every 5 minutes x 2 doses. If no relief call 9-1-1  ? omeprazole (PRILOSEC) 20 MG capsule Take 1 capsule by mouth daily as needed.  ? potassium chloride (KLOR-CON) 10 MEQ tablet 1 tablet with food  ? pravastatin (PRAVACHOL) 80 MG tablet TAKE ONE TABLET BY MOUTH DAILY  ? torsemide (DEMADEX) 10 MG tablet TAKE ONE TABLET BY MOUTH ONCE DAILY  ? traMADol (ULTRAM) 50 MG tablet 1 tablet daily as needed.  ? vitamin B-12 (CYANOCOBALAMIN) 1000 MCG tablet Take 1,000 mcg by mouth every morning.  ?  ? ?PAST MEDICAL HISTORY: ?Past Medical History:  ?Diagnosis Date  ? AORTIC INSUFFICIENCY 08/03/2009  ? Cardiomegaly 01/29/2007  ? CHF (congestive heart failure) ([place])   ?  Coronary artery disease   ? Diverticulosis of colon (without mention of hemorrhage) 08/08/2006  ? ESOPHAGEAL STRICTURE 02/19/2008  ? Heartburn 02/19/2008  ? HYPERLIPIDEMIA 02/18/2008  ? HYPERTENSION, BENIGN SYSTEMIC 08/08/2006  ? MYALGIA 01/29/2007  ? OSTEOARTHRITIS 12/21/2008  ? PEPTIC ULCER DISEASE 12/21/2008  ? RHINITIS, ALLERGIC 08/08/2006  ? Unspecified asthma(493.90) 08/08/2006  ? ? ?PAST SURGICAL HISTORY: ?Past Surgical History:  ?Procedure Laterality Date  ? ABDOMINAL HYSTERECTOMY    ?  CARDIAC CATHETERIZATION    ? ENDOTRACHEAL INTUBATION EMERGENT    ? ESOPHAGOGASTRODUODENOSCOPY    ? LEFT HEART CATH AND CORONARY ANGIOGRAPHY N/A 02/23/2020  ? Procedure: LEFT HEART CATH AND CORONARY ANGIOGRAPHY;  Surgeon: Nigel Mormon, MD;  Location: [facility];  Service: Cardiovascular;  Laterality: N/A;  ? ? ?FAMILY HISTORY: ?The patient family history includes Aneurysm in her sister; Diabetes in her mother; Hypertension in her mother; Lung disease in her mother. ? ?SOCIAL HISTORY:  ?The patient  reports that she quit smoking about 18 years ago. Her smoking use included cigarettes. She has a 15.00 pack-year smoking history. She has never used smokeless tobacco. She reports current alcohol use of about 1.0 - 2.0 standard drink per week. She reports that she does not use drugs. ? ?REVIEW OF SYSTEMS: ?Review of Systems  ?Cardiovascular:  Positive for dyspnea on exertion (improved). Negative for chest pain, leg swelling, orthopnea, palpitations, paroxysmal nocturnal dyspnea and syncope.  ?Respiratory:  Negative for shortness of breath.   ?Hematologic/Lymphatic: Negative for bleeding problem.  ? ?PHYSICAL EXAM: ? ?  10/11/2021  ? 10:10 AM 04/12/2021  ? 11:39 AM 12/15/2020  ? 10:29 AM  ?Vitals with BMI  ?Height 5\' 3"  5\' 3"    ?Weight 133 lbs 131 lbs   ?BMI 23.57 23.21   ?Systolic [id number]  ?Diastolic 76 82 87  ?Pulse 91 59 56  ? ? ?CONSTITUTIONAL: Appears older than stated age, hemodynamically stable, no acute distress, pleasant. ?SKIN: Skin is warm and dry. No rash noted. No cyanosis. No pallor. No jaundice ?HEAD: Normocephalic and atraumatic.  ?EYES: No scleral icterus ?MOUTH/THROAT: Moist oral membranes.  ?NECK: No JVD present. No thyromegaly noted. No carotid bruits  ?LYMPHATIC: No visible cervical adenopathy.  ?CHEST Normal respiratory effort. No intercostal retractions  ?LUNGS: Clear to auscultation bilaterally.  No stridor. No wheezes. No rales.  ?CARDIOVASCULAR: Regular, positive S1-S2, soft  holosystolic murmur heard at the apex, soft systolic ejection murmur, no gallops or rubs. ?ABDOMINAL: Soft, nontender, nondistended, positive bowel sounds in all 4 quadrants, no apparent ascites.  ?EXTREMITIES: Trace bilateral lower extremity swelling.  Pulses were difficult to palpate. ?HEMATOLOGIC: No significant bruising ?NEUROLOGIC: Oriented to person, place, and time. Nonfocal. Normal muscle tone.  ?PSYCHIATRIC: Normal mood and affect. Normal behavior. Cooperative ? ?CARDIAC DATABASE: ?EKG: ?Oct 11, 2021: Normal sinus rhythm, 95 bpm, LAE, left bundle branch block. Compared to prior ECG LBBB is new.  ? ?Echocardiogram: ?01/08/2020: ?Normal LV systolic function with visual EF 65-70%. Left ventricle cavity is normal in size. Mild left ventricular hypertrophy. Normal global wall motion. Doppler evidence of grade II diastolic dysfunction, elevated LAP. Calculated EF 68%. Left atrial cavity is mildly dilated.Mild (Grade I) aortic regurgitation.Mild (Grade I) mitral regurgitation.Mild tricuspid regurgitation. No evidence of pulmonary hypertension. RVSP measures 30 mmHg.Moderate pulmonic regurgitation.Trivial pericardial effusion with no hemodynamic compromise.No prior study for comparison. ? ?Stress Testing: ?Lexiscan Tetrofosmin Stress Test  02/08/2020: 2 day protocol.  ?Nondiagnostic ECG stress due to pharmacologic stress with Lexiscan. Non specific ST segment change noted  in stress in inferolateral leads.  ?Myocardial perfusion is abnormal.  ?There is a reversible moderate defect in the lateral and apical regions.  ?Overall LV systolic function is abnormal with regional wall motion abnormalities. Stress LV EF: 44%  with hypokinesis in the same region.  ?No previous exam available for comparison. Intermediate risk study.  ? ?Heart Catheterization: ?02/23/2020: ?LM: Normal ?LAD: Medial calcification with minimal luminal irregularities. ?LCx: Flush CTO. calcified vessel. Grade 2 left-to-left and right-to-left  collaterals from LAD and RCA ?RCA: Minimal luminal irregularities. ?LVEDP normal ? ?Lower Extremity Arterial Duplex  01/08/2020:  ?No hemodynamically significant stenoses are identified in the bilateral lower extremity art

## 2021-10-18 ENCOUNTER — Ambulatory Visit
Admission: RE | Admit: 2021-10-18 | Discharge: 2021-10-18 | Disposition: A | Discharge status: Home / Self Care | Payer: PRIVATE HEALTH INSURANCE | Source: Ambulatory Visit | Attending: Internal Medicine | Admitting: Internal Medicine

## 2021-10-18 ENCOUNTER — Ambulatory Visit
Admission: RE | Admit: 2021-10-18 | Discharge: 2021-10-18 | Disposition: A | Discharge status: Home / Self Care | Payer: Medicare HMO | Source: Ambulatory Visit | Attending: Internal Medicine | Admitting: Internal Medicine

## 2021-10-18 ENCOUNTER — Ambulatory Visit: Payer: PRIVATE HEALTH INSURANCE

## 2021-10-18 DIAGNOSIS — N6489 Other specified disorders of breast: Secondary | ICD-10-CM | POA: Diagnosis not present

## 2021-10-18 DIAGNOSIS — R928 Other abnormal and inconclusive findings on diagnostic imaging of breast: Secondary | ICD-10-CM | POA: Diagnosis not present

## 2021-11-08 DIAGNOSIS — I10 Essential (primary) hypertension: Secondary | ICD-10-CM | POA: Diagnosis not present

## 2021-11-08 DIAGNOSIS — E785 Hyperlipidemia, unspecified: Secondary | ICD-10-CM | POA: Diagnosis not present

## 2021-11-08 DIAGNOSIS — E11649 Type 2 diabetes mellitus with hypoglycemia without coma: Secondary | ICD-10-CM | POA: Diagnosis not present

## 2021-11-08 DIAGNOSIS — K219 Gastro-esophageal reflux disease without esophagitis: Secondary | ICD-10-CM | POA: Diagnosis not present

## 2022-04-13 ENCOUNTER — Ambulatory Visit: Payer: Medicare (Managed Care)

## 2022-04-16 ENCOUNTER — Ambulatory Visit: Payer: Medicare (Managed Care)

## 2022-04-16 NOTE — Progress Notes (Deleted)
ID:  Tanya Gray, DOB [birth year], MRN [id number]  PCP:  [name], DO  Cardiologist: [name], DO, [place] (established care 01/04/2020)  Date: 04/16/22 Last Office Visit: 04/12/2021  No chief complaint on file.   HPI  Tanya Gray is a 76 y.o. female whose past medical history and cardiovascular risk factors include: Hypertension, Hyperlipidemia, chronic HFpEF/stage B/NYHA class II, coronary artery disease without angina pectoris, left bundle branch block, former smoker.   Patient presents with one of her church members [name] at today's office visit. She provides verbal consent in regards to discussing her medical information in her presence.  She wanted Lavella Lemons to make decisions for her if needed, but Lavella Lemons does not have POA.   She is being followed by the practice for management of HFpEF.  She has not had any hospitalizations for congestive heart failure since last 6 months.  She is currently on appropriate medical therapy but titration has been difficult due to her cognitive impairment/dementia.  She never brings her medication bottles in for review nor does she know them with regards to reconciliation.  Patient has very poor insight into chronic comorbid conditions.  Patient states that she is trying to be placed into memory care unit but is awaiting on a POA.  She denies angina pectoris or heart failure symptoms.  Her weight remains relatively stable since last office visit.  I explained to her multiple times that we have not uptitrate GDMT as that she does not take medications as directed or has timely lab work.  If she does get placed in the memory care unit I will slowly uptitrate GDMT to the maximum tolerated doses.  EKG today notes newly discovered left bundle branch block.  Patient is asymptomatic.  FUNCTIONAL STATUS: No structured exercise program or daily routine.   ALLERGIES: No Known Allergies  MEDICATION LIST PRIOR TO VISIT: No outpatient medications  have been marked as taking for the 04/16/22 encounter (Appointment) with Ernst Spell, NP.     PAST MEDICAL HISTORY: Past Medical History:  Diagnosis Date   AORTIC INSUFFICIENCY 08/03/2009   Cardiomegaly 01/29/2007   CHF (congestive heart failure) (HCC)    Coronary artery disease    Diverticulosis of colon (without mention of hemorrhage) 08/08/2006   ESOPHAGEAL STRICTURE 02/19/2008   Heartburn 02/19/2008   HYPERLIPIDEMIA 02/18/2008   HYPERTENSION, BENIGN SYSTEMIC 08/08/2006   MYALGIA 01/29/2007   OSTEOARTHRITIS 12/21/2008   PEPTIC ULCER DISEASE 12/21/2008   RHINITIS, ALLERGIC 08/08/2006   Unspecified asthma(493.90) 08/08/2006    PAST SURGICAL HISTORY: Past Surgical History:  Procedure Laterality Date   ABDOMINAL HYSTERECTOMY     CARDIAC CATHETERIZATION     ENDOTRACHEAL INTUBATION EMERGENT     ESOPHAGOGASTRODUODENOSCOPY     LEFT HEART CATH AND CORONARY ANGIOGRAPHY N/A 02/23/2020   Procedure: LEFT HEART CATH AND CORONARY ANGIOGRAPHY;  Surgeon: Nigel Mormon, MD;  Location: [facility];  Service: Cardiovascular;  Laterality: N/A;    FAMILY HISTORY: The patient family history includes Aneurysm in her sister; Diabetes in her mother; Hypertension in her mother; Lung disease in her mother.  SOCIAL HISTORY:  The patient  reports that she quit smoking about 18 years ago. Her smoking use included cigarettes. She has a 15.00 pack-year smoking history. She has never used smokeless tobacco. She reports current alcohol use of about 1.0 - 2.0 standard drink of alcohol per week. She reports that she does not use drugs.  REVIEW OF SYSTEMS: Review of Systems  Cardiovascular:  Positive for  dyspnea on exertion (improved). Negative for chest pain, leg swelling, orthopnea, palpitations, paroxysmal nocturnal dyspnea and syncope.  Respiratory:  Negative for shortness of breath.   Hematologic/Lymphatic: Negative for bleeding problem.    PHYSICAL EXAM:    10/11/2021   10:10 AM 04/12/2021    11:39 AM 12/15/2020   10:29 AM  Vitals with BMI  Height 5\' 3"  5\' 3"    Weight 133 lbs 131 lbs   BMI 123456 [id number]   Systolic [id number]  Diastolic 76 82 87  Pulse 91 59 56    CONSTITUTIONAL: Appears older than stated age, hemodynamically stable, no acute distress, pleasant. SKIN: Skin is warm and dry. No rash noted. No cyanosis. No pallor. No jaundice HEAD: Normocephalic and atraumatic.  EYES: No scleral icterus MOUTH/THROAT: Moist oral membranes.  NECK: No JVD present. No thyromegaly noted. No carotid bruits  LYMPHATIC: No visible cervical adenopathy.  CHEST Normal respiratory effort. No intercostal retractions  LUNGS: Clear to auscultation bilaterally.  No stridor. No wheezes. No rales.  CARDIOVASCULAR: Regular, positive Q000111Q, soft holosystolic murmur heard at the apex, soft systolic ejection murmur, no gallops or rubs. ABDOMINAL: Soft, nontender, nondistended, positive bowel sounds in all 4 quadrants, no apparent ascites.  EXTREMITIES: Trace bilateral lower extremity swelling.  Pulses were difficult to palpate. HEMATOLOGIC: No significant bruising NEUROLOGIC: Oriented to person, place, and time. Nonfocal. Normal muscle tone.  PSYCHIATRIC: Normal mood and affect. Normal behavior. Cooperative  CARDIAC DATABASE: EKG: Oct 11, 2021: Normal sinus rhythm, 95 bpm, LAE, left bundle branch block. Compared to prior ECG LBBB is new.   Echocardiogram: 01/08/2020: Normal LV systolic function with visual EF 65-70%. Left ventricle cavity is normal in size. Mild left ventricular hypertrophy. Normal global wall motion. Doppler evidence of grade II diastolic dysfunction, elevated LAP. Calculated EF 68%. Left atrial cavity is mildly dilated.Mild (Grade I) aortic regurgitation.Mild (Grade I) mitral regurgitation.Mild tricuspid regurgitation. No evidence of pulmonary hypertension. RVSP measures 30 mmHg.Moderate pulmonic regurgitation.Trivial pericardial effusion with no hemodynamic compromise.No prior  study for comparison.  Stress Testing: Lexiscan Tetrofosmin Stress Test  02/08/2020: 2 day protocol.  Nondiagnostic ECG stress due to pharmacologic stress with Lexiscan. Non specific ST segment change noted in stress in inferolateral leads.  Myocardial perfusion is abnormal.  There is a reversible moderate defect in the lateral and apical regions.  Overall LV systolic function is abnormal with regional wall motion abnormalities. Stress LV EF: 44%  with hypokinesis in the same region.  No previous exam available for comparison. Intermediate risk study.   Heart Catheterization: 02/23/2020: LM: Normal LAD: Medial calcification with minimal luminal irregularities. LCx: Flush CTO. calcified vessel. Grade 2 left-to-left and right-to-left collaterals from LAD and RCA RCA: Minimal luminal irregularities. LVEDP normal  Lower Extremity Arterial Duplex  01/08/2020:  No hemodynamically significant stenoses are identified in the bilateral lower extremity arterial system.  This exam reveals normal perfusion of the right lower extremity (ABI 1.01) and mildly decreased perfusion of the left lower extremity, noted at the post tibial artery level (ABI 0.95).   Lower Extremity Venous Duplex  Bilateral 01/29/2020:  No evidence of deep vein thrombosis of the lower extremities with normal venous return.  Generalized tissue edema noted.   LABORATORY DATA:  External Labs:  04/20/2021: Sodium 137, potassium 3.7, glucose 85, BUN 20, creatinine 1.2. Hemoglobin 13.6, hematocrit 40.4, MCV 97.7, platelets 214 Cholesterol 181, triglycerides 59, HDL 69, LDL 100 TSH 0.48     Latest Ref Rng & Units 03/17/2021   10:37 AM 09/09/2020  10:57 AM 07/28/2020   11:44 AM  CMP  Glucose 70 - 99 mg/dL 84  102  89   BUN 8 - 27 mg/dL 22  25  19    Creatinine 0.57 - 1.00 mg/dL 1.24  1.22  1.06   Sodium 134 - 144 mmol/L 137  142  145   Potassium 3.5 - 5.2 mmol/L 4.2  4.0  4.3   Chloride 96 - 106 mmol/L 93  101  103   CO2  20 - 29 mmol/L 25  22  26    Calcium 8.7 - 10.3 mg/dL 10.2  9.8  9.5    Lipid Panel     Component Value Date/Time   CHOL 226 (H) 05/29/2019 1029   TRIG 58.0 05/29/2019 1029   HDL 85.00 05/29/2019 1029   CHOLHDL 3 05/29/2019 1029   VLDL 11.6 05/29/2019 1029   LDLCALC 130 (H) 05/29/2019 1029    No components found for: "NTPROBNP" No results for input(s): "PROBNP" in the last 8760 hours.  No results for input(s): "TSH" in the last 8760 hours.  HEMOGLOBIN A1C Lab Results  Component Value Date   HGBA1C 5.8 07/21/2008   IMPRESSION:    ICD-10-CM   1. Chronic heart failure with preserved ejection fraction (HFpEF) (HCC)  I50.32     2. Atherosclerosis of native coronary artery of native heart without angina pectoris  I25.10     3. Benign hypertension  I10     4. Mixed hyperlipidemia  E78.2         RECOMMENDATIONS: YULIETH CARRENDER is a 76 y.o. female whose past medical history and cardiac risk factors include: Hypertension, Hyperlipidemia, acute HFpEF/stage B/NYHA class II, former smoker.   Chronic heart failure with preserved ejection fraction (HFpEF) (HCC) Euvolemic No recent hospitalizations for congestive heart failure Poor insight with regards to her chronic comorbid conditions likely due to underlying dementia/cognitive impairment She is on acceptable medical therapy but given her noncompliance and follow-through I hesitate on uptitrating GDMT due to medication/side effect profile.  Patient verbalized understanding. If and when she does get established into a memory care unit we will slowly uptitrate GDMT with the addition of [place] and Entresto at some point. EKG: Sinus rhythm with left bundle branch block (new finding). Check CMP and NT proBNP and H&H  Atherosclerosis of native coronary artery of native heart without angina pectoris CTO of LCx with collaterals. Denies angina pectoris No use of sublingual nitroglycerin tablet since last office visit Continue  medical therapy  LBBB (left bundle branch block) Newly discovered on today's service EKG. Asymptomatic. Has undergone ischemic work-up in the recent past as noted above. Continue medical management for now.  Benign hypertension Office blood pressures within acceptable range. Reemphasized the importance of low-salt diet. She has been asked to keep a log of her blood pressures and daily weights on multiple occasions but due to cognitive impairment/dementia she forgets. Currently managed by primary care provider. We will monitor peripherally  Mixed hyperlipidemia Currently on pravastatin. Does not endorse myalgias. We will check fasting lipid profile to reevaluate therapy  FINAL MEDICATION LIST END OF ENCOUNTER: No orders of the defined types were placed in this encounter.   There are no discontinued medications.    Current Outpatient Medications:    acetaminophen (TYLENOL) 500 MG tablet, Take 1,000 mg by mouth every 6 (six) hours as needed for mild pain or headache., Disp: , Rfl:    B Complex Vitamins (VITAMIN B COMPLEX) TABS, Take 1 tablet by mouth daily.,  Disp: , Rfl:    benazepril (LOTENSIN) 40 MG tablet, TAKE ONE TABLET BY MOUTH DAILY, Disp: 90 tablet, Rfl: 2   Calcium Carbonate-Vitamin D (CALCIUM-VITAMIN D) 600-125 MG-UNIT TABS, Take 1 tablet by mouth daily., Disp: , Rfl:    metoprolol succinate (TOPROL-XL) 50 MG 24 hr tablet, TAKE ONE TABLET BY MOUTH ONCE DAILY, Disp: 90 tablet, Rfl: 2   nitroGLYCERIN (NITROSTAT) 0.4 MG SL tablet, Dissolve 1 tab under tongue as needed for chest pain. May repeat every 5 minutes x 2 doses. If no relief call 9-1-1, Disp: 30 tablet, Rfl: 1   omeprazole (PRILOSEC) 20 MG capsule, Take 1 capsule by mouth daily as needed., Disp: , Rfl:    potassium chloride (KLOR-CON) 10 MEQ tablet, 1 tablet with food, Disp: , Rfl:    pravastatin (PRAVACHOL) 80 MG tablet, TAKE ONE TABLET BY MOUTH DAILY, Disp: 90 tablet, Rfl: 2   torsemide (DEMADEX) 10 MG tablet, TAKE  ONE TABLET BY MOUTH ONCE DAILY, Disp: 90 tablet, Rfl: 2   traMADol (ULTRAM) 50 MG tablet, 1 tablet daily as needed., Disp: , Rfl:    vitamin B-12 (CYANOCOBALAMIN) 1000 MCG tablet, Take 1,000 mcg by mouth every morning., Disp: , Rfl:   No orders of the defined types were placed in this encounter.   There are no Patient Instructions on file for this visit.  --Continue cardiac medications as reconciled in final medication list. --No follow-ups on file. Or sooner if needed. --Continue follow-up with your primary care physician regarding the management of your other chronic comorbid conditions.  Patient's questions and concerns were addressed to her satisfaction. She voices understanding of the instructions provided during this encounter.   This note was created using a voice recognition software as a result there may be grammatical errors inadvertently enclosed that do not reflect the nature of this encounter. Every attempt is made to correct such errors.    Ernst Spell, [place] Pager: [phone] Office: [phone]

## 2022-09-27 ENCOUNTER — Encounter (HOSPITAL_COMMUNITY): Payer: Self-pay | Admitting: Emergency Medicine

## 2022-09-27 ENCOUNTER — Emergency Department (HOSPITAL_COMMUNITY)
Admission: EM | Admit: 2022-09-27 | Discharge: 2022-09-28 | Disposition: A | Discharge status: Home / Self Care | Payer: 59 | Attending: Emergency Medicine | Admitting: Emergency Medicine

## 2022-09-27 ENCOUNTER — Emergency Department (HOSPITAL_COMMUNITY): Payer: 59

## 2022-09-27 DIAGNOSIS — R112 Nausea with vomiting, unspecified: Secondary | ICD-10-CM | POA: Insufficient documentation

## 2022-09-27 DIAGNOSIS — R197 Diarrhea, unspecified: Secondary | ICD-10-CM | POA: Insufficient documentation

## 2022-09-27 DIAGNOSIS — I110 Hypertensive heart disease with heart failure: Secondary | ICD-10-CM | POA: Insufficient documentation

## 2022-09-27 DIAGNOSIS — I509 Heart failure, unspecified: Secondary | ICD-10-CM | POA: Diagnosis not present

## 2022-09-27 DIAGNOSIS — I2510 Atherosclerotic heart disease of native coronary artery without angina pectoris: Secondary | ICD-10-CM | POA: Diagnosis not present

## 2022-09-27 LAB — CBC WITH DIFFERENTIAL/PLATELET
Abs Immature Granulocytes: 0.01 10*3/uL (ref 0.00–0.07)
Basophils Absolute: 0 10*3/uL (ref 0.0–0.1)
Basophils Relative: 0 %
Eosinophils Absolute: 0 10*3/uL (ref 0.0–0.5)
Eosinophils Relative: 0 %
HCT: 42.7 % (ref 36.0–46.0)
Hemoglobin: 13.3 g/dL (ref 12.0–15.0)
Immature Granulocytes: 0 %
Lymphocytes Relative: 8 %
Lymphs Abs: 0.4 10*3/uL — ABNORMAL LOW (ref 0.7–4.0)
MCH: 29.9 pg (ref 26.0–34.0)
MCHC: 31.1 g/dL (ref 30.0–36.0)
MCV: 96 fL (ref 80.0–100.0)
Monocytes Absolute: 0.3 10*3/uL (ref 0.1–1.0)
Monocytes Relative: 6 %
Neutro Abs: 4 10*3/uL (ref 1.7–7.7)
Neutrophils Relative %: 86 %
Platelets: 193 10*3/uL (ref 150–400)
RBC: 4.45 MIL/uL (ref 3.87–5.11)
RDW: 15.4 % (ref 11.5–15.5)
WBC: 4.7 10*3/uL (ref 4.0–10.5)
nRBC: 0 % (ref 0.0–0.2)

## 2022-09-27 LAB — COMPREHENSIVE METABOLIC PANEL
ALT: 17 U/L (ref 0–44)
AST: 29 U/L (ref 15–41)
Albumin: 3.5 g/dL (ref 3.5–5.0)
Alkaline Phosphatase: 57 U/L (ref 38–126)
Anion gap: 10 (ref 5–15)
BUN: 15 mg/dL (ref 8–23)
CO2: 25 mmol/L (ref 22–32)
Calcium: 9 mg/dL (ref 8.9–10.3)
Chloride: 106 mmol/L (ref 98–111)
Creatinine, Ser: 1 mg/dL (ref 0.44–1.00)
GFR, Estimated: 59 mL/min — ABNORMAL LOW (ref >60)
Glucose, Bld: 101 mg/dL — ABNORMAL HIGH (ref 70–99)
Potassium: 4.2 mmol/L (ref 3.5–5.1)
Sodium: 141 mmol/L (ref 135–145)
Total Bilirubin: 0.7 mg/dL (ref 0.3–1.2)
Total Protein: 6.2 g/dL — ABNORMAL LOW (ref 6.5–8.1)

## 2022-09-27 LAB — MAGNESIUM: Magnesium: 1.5 mg/dL — ABNORMAL LOW (ref 1.7–2.4)

## 2022-09-27 LAB — LIPASE, BLOOD: Lipase: 33 U/L (ref 11–51)

## 2022-09-27 MED ORDER — IOHEXOL 350 MG/ML SOLN
75.0000 mL | Freq: Once | INTRAVENOUS | Status: AC | PRN
  Administered 2022-09-27: 75 mL via INTRAVENOUS

## 2022-09-27 MED ORDER — SODIUM CHLORIDE 0.9 % IV BOLUS
1000.0000 mL | Freq: Once | INTRAVENOUS | Status: AC
  Administered 2022-09-27: 1000 mL via INTRAVENOUS

## 2022-09-27 NOTE — ED Provider Notes (Signed)
[facility] AT [facility] Provider Note   CSN: [id number] Arrival date & time: 09/27/22  1815     History  Chief Complaint  Patient presents with   Nausea    Vomiting    Tanya Gray is a 77 y.o. female.  HPI     This is a 77 year old female who presents by EMS with reported nausea, vomiting, diarrhea.  Patient does not provide much history.  Per EMS they were called out for nausea, vomiting, diarrhea.  Patient denies any pain.  She does not provide further history.  She is oriented to herself and place but not time.  Level 5 caveat  Home Medications Prior to Admission medications   Medication Sig Start Date End Date Taking? Authorizing Provider  acetaminophen (TYLENOL) 500 MG tablet Take 1,000 mg by mouth every 6 (six) hours as needed for mild pain or headache.    [provider]  B Complex Vitamins (VITAMIN B COMPLEX) TABS Take 1 tablet by mouth daily. 03/15/21   [provider]  benazepril (LOTENSIN) 40 MG tablet TAKE ONE TABLET BY MOUTH DAILY 08/02/21   Tolia, Sunit, DO  Calcium Carbonate-Vitamin D (CALCIUM-VITAMIN D) 600-125 MG-UNIT TABS Take 1 tablet by mouth daily.    [provider]  metoprolol succinate (TOPROL-XL) 50 MG 24 hr tablet TAKE ONE TABLET BY MOUTH ONCE DAILY 08/02/21   Tolia, Sunit, DO  nitroGLYCERIN (NITROSTAT) 0.4 MG SL tablet Dissolve 1 tab under tongue as needed for chest pain. May repeat every 5 minutes x 2 doses. If no relief call 9-1-1 04/14/21   Odis Hollingshead, Sunit, DO  omeprazole (PRILOSEC) 20 MG capsule Take 1 capsule by mouth daily as needed. 12/10/19   [provider]  potassium chloride (KLOR-CON) 10 MEQ tablet 1 tablet with food    [provider]  pravastatin (PRAVACHOL) 80 MG tablet TAKE ONE TABLET BY MOUTH DAILY 08/02/21   Tolia, Sunit, DO  torsemide (DEMADEX) 10 MG tablet TAKE ONE TABLET BY MOUTH ONCE DAILY 08/02/21   Tolia, Sunit, DO  traMADol (ULTRAM) 50 MG tablet 1  tablet daily as needed. 12/10/19   [provider]  vitamin B-12 (CYANOCOBALAMIN) 1000 MCG tablet Take 1,000 mcg by mouth every morning. 03/15/21   [provider]      Allergies    Patient has no known allergies.    Review of Systems   Review of Systems  Allergic/Immunologic: Immunocompromised state: dementia.    Physical Exam Updated Vital Signs BP (!) 169/75   Pulse 62   Temp 99.4 F (37.4 C) (Oral)   Resp 20   Ht 1.6 m ( )   Wt 60 kg   SpO2 100%   BMI 23.43 kg/m  Physical Exam Vitals and nursing note reviewed.  Constitutional:      Appearance: She is well-developed.     Comments: Chronically ill-appearing but nontoxic, no acute distress  HENT:     Head: Normocephalic and atraumatic.     Mouth/Throat:     Mouth: Mucous membranes are dry.  Eyes:     Pupils: Pupils are equal, round, and reactive to light.  Cardiovascular:     Rate and Rhythm: Normal rate and regular rhythm.     Heart sounds: Normal heart sounds.  Pulmonary:     Effort: Pulmonary effort is normal. No respiratory distress.     Breath sounds: No wheezing.  Abdominal:     Palpations: Abdomen is soft.     Tenderness: There  is no abdominal tenderness. There is no guarding or rebound.  Musculoskeletal:     Cervical back: Neck supple.     Right lower leg: No edema.     Left lower leg: No edema.  Skin:    General: Skin is warm and dry.  Neurological:     Mental Status: She is alert.     Comments: Oriented x 2  Psychiatric:     Comments: Unable to assess mood     ED Results / Procedures / Treatments   Labs (all labs ordered are listed, but only abnormal results are displayed) Labs Reviewed  CBC WITH DIFFERENTIAL/PLATELET - Abnormal; Notable for the following components:      Result Value   Lymphs Abs 0.4 (*)    All other components within normal limits  COMPREHENSIVE METABOLIC PANEL - Abnormal; Notable for the following components:   Glucose, Bld 101 (*)    Total Protein  6.2 (*)    GFR, Estimated 59 (*)    All other components within normal limits  MAGNESIUM - Abnormal; Notable for the following components:   Magnesium 1.5 (*)    All other components within normal limits  LIPASE, BLOOD  URINALYSIS, ROUTINE W REFLEX MICROSCOPIC    EKG EKG Interpretation  Date/Time:  Thursday September 27 2022 21:56:23 EDT Ventricular Rate:  63 PR Interval:  161 QRS Duration: 136 QT Interval:  433 QTC Calculation: 444 R Axis:   103 Text Interpretation: Sinus rhythm Non-specific intra-ventricular conduction delay Confirmed by Ross Marcus ([id number]) on 09/27/2022 10:20:12 PM  Radiology CT ABDOMEN PELVIS W CONTRAST  Result Date: 09/27/2022 CLINICAL DATA:  Acute abdominal pain. EXAM: CT ABDOMEN AND PELVIS WITH CONTRAST TECHNIQUE: Multidetector CT imaging of the abdomen and pelvis was performed using the standard protocol following bolus administration of intravenous contrast. RADIATION DOSE REDUCTION: This exam was performed according to the departmental dose-optimization program which includes automated exposure control, adjustment of the mA and/or kV according to patient size and/or use of iterative reconstruction technique. CONTRAST:  75mL OMNIPAQUE IOHEXOL 350 MG/ML SOLN COMPARISON:  None Available. FINDINGS: Lower chest: No acute basilar airspace disease. There are coronary artery calcifications. Mild cardiomegaly. Hepatobiliary: No focal liver abnormality is seen. Elongated right lobe of the liver. No gallstones, gallbladder wall thickening, or biliary dilatation. Pancreas: No ductal dilatation or inflammation. Spleen: Normal in size without focal abnormality. Adrenals/Urinary Tract: No adrenal nodule. Cortical scarring in the upper pole of the right kidney. No hydronephrosis or perinephric edema. Homogeneous renal enhancement with symmetric excretion on delayed phase imaging. No evidence of renal stone or suspicious renal lesion. Urinary bladder is partially distended, mild  wall thickening. Stomach/Bowel: Bowel assessment is limited in the absence of enteric contrast and paucity of intra-abdominal fat. Pelvic bowel loops are fluid-filled but not abnormally distended. The appendix is not definitively visualized. Air throughout the colon which is not abnormally dilated. Vascular/Lymphatic: Moderate aortic atherosclerosis. No aneurysm. The portal vein is patent. There is no bulky adenopathy. Reproductive: Hysterectomy. Small amount of fluid in the vaginal cuff. No obvious adnexal mass. Other: No free intra-abdominal air. No definite ascites. No abdominal wall hernia. Musculoskeletal: There are no acute or suspicious osseous abnormalities. IMPRESSION: 1. Mild urinary bladder wall thickening, can be seen with cystitis. Recommend correlation with urinalysis. 2. Fluid-filled small bowel loops in the pelvis, nonspecific but can be seen with enteritis. 3. Right renal scarring. Aortic Atherosclerosis (ICD10-I70.0). Electronically Signed   By: Narda Rutherford M.D.   On: 09/27/2022 22:32  Procedures Procedures    Medications Ordered in ED Medications  sodium chloride 0.9 % bolus 1,000 mL (1,000 mLs Intravenous New Bag/Given 09/27/22 1851)  iohexol (OMNIPAQUE) 350 MG/ML injection 75 mL (75 mLs Intravenous Contrast Given 09/27/22 2218)    ED Course/ Medical Decision Making/ A&P                             Medical Decision Making Amount and/or Complexity of Data Reviewed Labs: ordered. Radiology: ordered.  Risk Prescription drug management.   This patient presents to the ED for concern of vomiting and diarrhea, this involves an extensive number of treatment options, and is a complaint that carries with it a high risk of complications and morbidity.  I considered the following differential and admission for this acute, potentially life threatening condition.  The differential diagnosis includes viral illness, obstruction, infection  MDM:    This is a 77 year old female  with a history of dementia who is a very poor historian.  She does not provide much history.  Reportedly having vomiting and diarrhea.  She is nontoxic.  She does clinically appear dry.  Labs obtained.  No leukocytosis.  No significant metabolic derangements.  CT scan shows thickening of the bladder wall.  She has not been able to provide a urine sample.  I requested urine cath.  (Labs, imaging, consults)  Labs: I Ordered, and personally interpreted labs.  The pertinent results include: CBC, CMP, lipase, urinalysis  Imaging Studies ordered: I ordered imaging studies including CT abdomen pelvis I independently visualized and interpreted imaging. I agree with the radiologist interpretation  Additional history obtained from EMS.  External records from outside source obtained and reviewed including chart review  Cardiac Monitoring: The patient was maintained on a cardiac monitor.  If on the cardiac monitor, I personally viewed and interpreted the cardiac monitored which showed an underlying rhythm of: NSR  Reevaluation: After the interventions noted above, I reevaluated the patient and found that they have :stayed the same  Social Determinants of Health: Dementia  Disposition:  pending  Co morbidities that complicate the patient evaluation  Past Medical History:  Diagnosis Date   AORTIC INSUFFICIENCY 08/03/2009   Cardiomegaly 01/29/2007   CHF (congestive heart failure)    Coronary artery disease    Diverticulosis of colon (without mention of hemorrhage) 08/08/2006   ESOPHAGEAL STRICTURE 02/19/2008   Heartburn 02/19/2008   HYPERLIPIDEMIA 02/18/2008   HYPERTENSION, BENIGN SYSTEMIC 08/08/2006   MYALGIA 01/29/2007   OSTEOARTHRITIS 12/21/2008   PEPTIC ULCER DISEASE 12/21/2008   RHINITIS, ALLERGIC 08/08/2006   Unspecified asthma(493.90) 08/08/2006     Medicines Meds ordered this encounter  Medications   sodium chloride 0.9 % bolus 1,000 mL   iohexol (OMNIPAQUE) 350 MG/ML injection 75 mL     I have reviewed the patients home medicines and have made adjustments as needed  Problem List / ED Course: Problem List Items Addressed This Visit   None               Final Clinical Impression(s) / ED Diagnoses Final diagnoses:  None    Rx / DC Orders ED Discharge Orders     None         Shon Baton, MD 09/27/22 2322

## 2022-09-27 NOTE — ED Triage Notes (Signed)
Pt BIB [facility] from [facility] due to nausea, vomiting and diarrhea today.  Hx dementia.  VS BP 162/82, HR 68, CBG 143.

## 2022-09-28 DIAGNOSIS — R112 Nausea with vomiting, unspecified: Secondary | ICD-10-CM | POA: Diagnosis not present

## 2022-09-28 LAB — URINALYSIS, ROUTINE W REFLEX MICROSCOPIC
Bacteria, UA: NONE SEEN
Bilirubin Urine: NEGATIVE
Glucose, UA: NEGATIVE mg/dL
Hgb urine dipstick: NEGATIVE
Ketones, ur: NEGATIVE mg/dL
Leukocytes,Ua: NEGATIVE
Nitrite: NEGATIVE
Protein, ur: NEGATIVE mg/dL
Specific Gravity, Urine: >1.046 — ABNORMAL HIGH (ref 1.005–1.030)
pH: 5 (ref 5.0–8.0)

## 2022-09-28 MED ORDER — MAGNESIUM SULFATE 2 GM/50ML IV SOLN
2.0000 g | Freq: Once | INTRAVENOUS | Status: AC
  Administered 2022-09-28: 2 g via INTRAVENOUS
  Filled 2022-09-28: qty 50

## 2022-09-28 NOTE — ED Notes (Addendum)
Performed I/O cath on pt assisted by NT (Page). Voided 10mL

## 2022-09-28 NOTE — ED Provider Notes (Signed)
Will replenish her magnesium prior to discharge   Zadie Rhine, MD 09/28/22 0202

## 2022-09-28 NOTE — ED Provider Notes (Signed)
I assumed care at signout to follow-up on labs.  No signs of UTI.  Overall workup was unremarkable.  On my exam, patient is resting comfortably.  She is easily arousable in no acute distress.  No focal abdominal tenderness.  Patient to be discharged back to facility   Zadie Rhine, MD 09/28/22 0200

## 2022-09-28 NOTE — ED Notes (Signed)
Attempted report to facility, unsuccessful.

## 2022-11-07 ENCOUNTER — Other Ambulatory Visit: Payer: Self-pay | Admitting: Internal Medicine

## 2022-11-07 DIAGNOSIS — Z1231 Encounter for screening mammogram for malignant neoplasm of breast: Secondary | ICD-10-CM

## 2022-12-06 ENCOUNTER — Ambulatory Visit: Payer: 59

## 2022-12-10 ENCOUNTER — Ambulatory Visit: Payer: 59

## 2023-01-08 ENCOUNTER — Ambulatory Visit: Payer: 59

## 2023-01-23 NOTE — Telephone Encounter (Signed)
done

## 2023-07-14 ENCOUNTER — Emergency Department (HOSPITAL_COMMUNITY): Payer: 59

## 2023-07-14 ENCOUNTER — Other Ambulatory Visit: Payer: Self-pay

## 2023-07-14 ENCOUNTER — Inpatient Hospital Stay (HOSPITAL_COMMUNITY)
Admission: EM | Admit: 2023-07-14 | Discharge: 2023-07-19 | DRG: 865 | Disposition: A | Discharge status: Home / Self Care | Payer: 59 | Attending: Internal Medicine | Admitting: Internal Medicine

## 2023-07-14 DIAGNOSIS — J1081 Influenza due to other identified influenza virus with encephalopathy: Principal | ICD-10-CM | POA: Diagnosis present

## 2023-07-14 DIAGNOSIS — R32 Unspecified urinary incontinence: Secondary | ICD-10-CM | POA: Diagnosis present

## 2023-07-14 DIAGNOSIS — Z8659 Personal history of other mental and behavioral disorders: Secondary | ICD-10-CM

## 2023-07-14 DIAGNOSIS — J101 Influenza due to other identified influenza virus with other respiratory manifestations: Secondary | ICD-10-CM | POA: Diagnosis not present

## 2023-07-14 DIAGNOSIS — I358 Other nonrheumatic aortic valve disorders: Secondary | ICD-10-CM | POA: Diagnosis present

## 2023-07-14 DIAGNOSIS — I371 Nonrheumatic pulmonary valve insufficiency: Secondary | ICD-10-CM | POA: Diagnosis present

## 2023-07-14 DIAGNOSIS — Z90710 Acquired absence of both cervix and uterus: Secondary | ICD-10-CM

## 2023-07-14 DIAGNOSIS — Z8249 Family history of ischemic heart disease and other diseases of the circulatory system: Secondary | ICD-10-CM

## 2023-07-14 DIAGNOSIS — I110 Hypertensive heart disease with heart failure: Secondary | ICD-10-CM | POA: Diagnosis present

## 2023-07-14 DIAGNOSIS — I5032 Chronic diastolic (congestive) heart failure: Secondary | ICD-10-CM | POA: Diagnosis present

## 2023-07-14 DIAGNOSIS — F03918 Unspecified dementia, unspecified severity, with other behavioral disturbance: Secondary | ICD-10-CM | POA: Diagnosis present

## 2023-07-14 DIAGNOSIS — G9340 Encephalopathy, unspecified: Secondary | ICD-10-CM | POA: Diagnosis not present

## 2023-07-14 DIAGNOSIS — Z79899 Other long term (current) drug therapy: Secondary | ICD-10-CM

## 2023-07-14 DIAGNOSIS — J45909 Unspecified asthma, uncomplicated: Secondary | ICD-10-CM | POA: Diagnosis present

## 2023-07-14 DIAGNOSIS — E785 Hyperlipidemia, unspecified: Secondary | ICD-10-CM | POA: Diagnosis present

## 2023-07-14 DIAGNOSIS — E162 Hypoglycemia, unspecified: Secondary | ICD-10-CM | POA: Insufficient documentation

## 2023-07-14 DIAGNOSIS — G9341 Metabolic encephalopathy: Secondary | ICD-10-CM | POA: Diagnosis present

## 2023-07-14 DIAGNOSIS — Z833 Family history of diabetes mellitus: Secondary | ICD-10-CM

## 2023-07-14 DIAGNOSIS — Z7982 Long term (current) use of aspirin: Secondary | ICD-10-CM

## 2023-07-14 DIAGNOSIS — R4182 Altered mental status, unspecified: Secondary | ICD-10-CM

## 2023-07-14 DIAGNOSIS — I7121 Aneurysm of the ascending aorta, without rupture: Secondary | ICD-10-CM | POA: Diagnosis present

## 2023-07-14 DIAGNOSIS — E860 Dehydration: Secondary | ICD-10-CM | POA: Diagnosis present

## 2023-07-14 DIAGNOSIS — I2510 Atherosclerotic heart disease of native coronary artery without angina pectoris: Secondary | ICD-10-CM | POA: Diagnosis present

## 2023-07-14 DIAGNOSIS — D72810 Lymphocytopenia: Secondary | ICD-10-CM | POA: Diagnosis present

## 2023-07-14 DIAGNOSIS — E871 Hypo-osmolality and hyponatremia: Secondary | ICD-10-CM | POA: Diagnosis present

## 2023-07-14 DIAGNOSIS — Z1152 Encounter for screening for COVID-19: Secondary | ICD-10-CM

## 2023-07-14 DIAGNOSIS — Z8711 Personal history of peptic ulcer disease: Secondary | ICD-10-CM

## 2023-07-14 DIAGNOSIS — I159 Secondary hypertension, unspecified: Secondary | ICD-10-CM

## 2023-07-14 LAB — CBC WITH DIFFERENTIAL/PLATELET
Abs Immature Granulocytes: 0.01 10*3/uL (ref 0.00–0.07)
Basophils Absolute: 0 10*3/uL (ref 0.0–0.1)
Basophils Relative: 1 %
Eosinophils Absolute: 0 10*3/uL (ref 0.0–0.5)
Eosinophils Relative: 1 %
HCT: 44.8 % (ref 36.0–46.0)
Hemoglobin: 14.1 g/dL (ref 12.0–15.0)
Immature Granulocytes: 0 %
Lymphocytes Relative: 10 %
Lymphs Abs: 0.3 10*3/uL — ABNORMAL LOW (ref 0.7–4.0)
MCH: 30.5 pg (ref 26.0–34.0)
MCHC: 31.5 g/dL (ref 30.0–36.0)
MCV: 96.8 fL (ref 80.0–100.0)
Monocytes Absolute: 0.3 10*3/uL (ref 0.1–1.0)
Monocytes Relative: 8 %
Neutro Abs: 2.7 10*3/uL (ref 1.7–7.7)
Neutrophils Relative %: 80 %
Platelets: 210 10*3/uL (ref 150–400)
RBC: 4.63 MIL/uL (ref 3.87–5.11)
RDW: 13.4 % (ref 11.5–15.5)
WBC: 3.4 10*3/uL — ABNORMAL LOW (ref 4.0–10.5)
nRBC: 0 % (ref 0.0–0.2)

## 2023-07-14 LAB — TSH: TSH: 0.742 u[IU]/mL (ref 0.350–4.500)

## 2023-07-14 LAB — URINALYSIS, W/ REFLEX TO CULTURE (INFECTION SUSPECTED)
Bacteria, UA: NONE SEEN
Bilirubin Urine: NEGATIVE
Glucose, UA: NEGATIVE mg/dL
Ketones, ur: NEGATIVE mg/dL
Leukocytes,Ua: NEGATIVE
Nitrite: NEGATIVE
Protein, ur: NEGATIVE mg/dL
Specific Gravity, Urine: 1.021 (ref 1.005–1.030)
pH: 5 (ref 5.0–8.0)

## 2023-07-14 LAB — COMPREHENSIVE METABOLIC PANEL
ALT: 23 U/L (ref 0–44)
AST: 44 U/L — ABNORMAL HIGH (ref 15–41)
Albumin: 4.2 g/dL (ref 3.5–5.0)
Alkaline Phosphatase: 74 U/L (ref 38–126)
Anion gap: 11 (ref 5–15)
BUN: 11 mg/dL (ref 8–23)
CO2: 22 mmol/L (ref 22–32)
Calcium: 9.3 mg/dL (ref 8.9–10.3)
Chloride: 101 mmol/L (ref 98–111)
Creatinine, Ser: 1.01 mg/dL — ABNORMAL HIGH (ref 0.44–1.00)
GFR, Estimated: 58 mL/min — ABNORMAL LOW (ref >60)
Glucose, Bld: 91 mg/dL (ref 70–99)
Potassium: 4.2 mmol/L (ref 3.5–5.1)
Sodium: 134 mmol/L — ABNORMAL LOW (ref 135–145)
Total Bilirubin: 0.8 mg/dL (ref 0.0–1.2)
Total Protein: 7.7 g/dL (ref 6.5–8.1)

## 2023-07-14 LAB — RESP PANEL BY RT-PCR (RSV, FLU A&B, COVID)  RVPGX2
Influenza A by PCR: POSITIVE — AB
Influenza B by PCR: NEGATIVE
Resp Syncytial Virus by PCR: NEGATIVE
SARS Coronavirus 2 by RT PCR: NEGATIVE

## 2023-07-14 LAB — MAGNESIUM: Magnesium: 1.9 mg/dL (ref 1.7–2.4)

## 2023-07-14 LAB — VITAMIN B12: Vitamin B-12: 847 pg/mL (ref 180–914)

## 2023-07-14 LAB — OSMOLALITY: Osmolality: 297 mosm/kg — ABNORMAL HIGH (ref 275–295)

## 2023-07-14 MED ORDER — ENOXAPARIN SODIUM 40 MG/0.4ML IJ SOSY
40.0000 mg | PREFILLED_SYRINGE | INTRAMUSCULAR | Status: DC
Start: 2023-07-14 — End: 2023-07-14
  Filled 2023-07-14: qty 0.4

## 2023-07-14 MED ORDER — GUAIFENESIN ER 600 MG PO TB12
600.0000 mg | ORAL_TABLET | Freq: Two times a day (BID) | ORAL | Status: DC
  Administered 2023-07-14 – 2023-07-19 (×8): 600 mg via ORAL
  Filled 2023-07-14 (×10): qty 1

## 2023-07-14 MED ORDER — ACETAMINOPHEN 500 MG PO TABS
1000.0000 mg | ORAL_TABLET | Freq: Once | ORAL | Status: DC
  Filled 2023-07-14: qty 2

## 2023-07-14 MED ORDER — OSELTAMIVIR PHOSPHATE 75 MG PO CAPS: 75.0000 mg | ORAL_CAPSULE | Freq: Once | ORAL | Status: DC

## 2023-07-14 MED ORDER — ALBUTEROL SULFATE (2.5 MG/3ML) 0.083% IN NEBU
2.5000 mg | INHALATION_SOLUTION | Freq: Four times a day (QID) | RESPIRATORY_TRACT | Status: DC
  Administered 2023-07-14 – 2023-07-15 (×3): 2.5 mg via RESPIRATORY_TRACT
  Filled 2023-07-14 (×3): qty 3

## 2023-07-14 MED ORDER — ACETAMINOPHEN 650 MG RE SUPP
650.0000 mg | Freq: Three times a day (TID) | RECTAL | Status: DC
  Filled 2023-07-14: qty 1

## 2023-07-14 MED ORDER — ACETAMINOPHEN 500 MG PO TABS
1000.0000 mg | ORAL_TABLET | Freq: Three times a day (TID) | ORAL | Status: DC
  Administered 2023-07-15 (×3): 1000 mg via ORAL
  Filled 2023-07-14 (×6): qty 2

## 2023-07-14 MED ORDER — ACETAMINOPHEN 325 MG PO TABS: 650.0000 mg | ORAL_TABLET | Freq: Four times a day (QID) | ORAL | Status: DC | PRN

## 2023-07-14 MED ORDER — LACTATED RINGERS IV BOLUS
1000.0000 mL | Freq: Once | INTRAVENOUS | Status: AC
  Administered 2023-07-14: 1000 mL via INTRAVENOUS

## 2023-07-14 MED ORDER — ACETAMINOPHEN 650 MG RE SUPP
650.0000 mg | Freq: Once | RECTAL | Status: AC
  Administered 2023-07-14: 650 mg via RECTAL
  Filled 2023-07-14: qty 1

## 2023-07-14 MED ORDER — ACETAMINOPHEN 650 MG RE SUPP: 650.0000 mg | Freq: Four times a day (QID) | RECTAL | Status: DC | PRN

## 2023-07-14 NOTE — ED Notes (Signed)
ED TO INPATIENT HANDOFF REPORT  ED Nurse Name and Phone #: Steward Drone [phone]  S Name/Age/Gender Tanya Gray 78 y.o. female Room/Bed: 038C/038C  Code Status   Code Status: Full Code  Home/SNF/Other Nursing Home Patient oriented to: self Is this baseline?  Pt normally able to carry on some sort of conversation. Today, pt is unable to converse except for a word here or there.  Triage Complete: Triage complete  Chief Complaint Acute encephalopathy [G93.40]  Triage Note PT BIB EMS from facility. Pt has dementia at baseline however is normally talkative. Pt has not been talking today and has a productive cough and tactile fever per EMS. PT Last known well was 11pm yesterday.    Allergies No Known Allergies  Level of Care/Admitting Diagnosis ED Disposition     ED Disposition  Admit   Condition  --   Comment  Hospital Area: [facility] [[id number]]  Level of Care: Telemetry Medical [104]  May place patient in observation at [facility] or [facility] if equivalent level of care is available:: No  Covid Evaluation: Confirmed COVID Negative  Diagnosis: Acute encephalopathy [409811]  Admitting Physician: Yevette Edwards  Attending Physician: HATCHER, JEFFREY C [[id number]]          B Medical/Surgery History Past Medical History:  Diagnosis Date   AORTIC INSUFFICIENCY 08/03/2009   Cardiomegaly 01/29/2007   CHF (congestive heart failure) (HCC)    Coronary artery disease    Diverticulosis of colon (without mention of hemorrhage) 08/08/2006   ESOPHAGEAL STRICTURE 02/19/2008   Heartburn 02/19/2008   HYPERLIPIDEMIA 02/18/2008   HYPERTENSION, BENIGN SYSTEMIC 08/08/2006   MYALGIA 01/29/2007   OSTEOARTHRITIS 12/21/2008   PEPTIC ULCER DISEASE 12/21/2008   RHINITIS, ALLERGIC 08/08/2006   Unspecified asthma(493.90) 08/08/2006   Past Surgical History:  Procedure Laterality Date   ABDOMINAL HYSTERECTOMY     CARDIAC CATHETERIZATION     ENDOTRACHEAL INTUBATION  EMERGENT     ESOPHAGOGASTRODUODENOSCOPY     LEFT HEART CATH AND CORONARY ANGIOGRAPHY N/A 02/23/2020   Procedure: LEFT HEART CATH AND CORONARY ANGIOGRAPHY;  Surgeon: Elder Negus, MD;  Location: [facility] INVASIVE CV LAB;  Service: Cardiovascular;  Laterality: N/A;     A IV Location/Drains/Wounds Patient Lines/Drains/Airways Status     Active Line/Drains/Airways     Name Placement date Placement time Site Days   Peripheral IV 07/14/23 20 G Distal;Left;Posterior Forearm 07/14/23  --  Forearm  less than 1            Intake/Output Last 24 hours No intake or output data in the 24 hours ending 07/14/23 1923  Labs/Imaging Results for orders placed or performed during the hospital encounter of 07/14/23 (from the past 48 hours)  Resp panel by RT-PCR (RSV, Flu A&B, Covid) Anterior Nasal Swab     Status: Abnormal   Collection Time: 07/14/23  1:27 PM   Specimen: Anterior Nasal Swab  Result Value Ref Range   SARS Coronavirus 2 by RT PCR NEGATIVE NEGATIVE   Influenza A by PCR POSITIVE (A) NEGATIVE   Influenza B by PCR NEGATIVE NEGATIVE    Comment: (NOTE) The Xpert Xpress SARS-CoV-2/FLU/RSV plus assay is intended as an aid in the diagnosis of influenza from Nasopharyngeal swab specimens and should not be used as a sole basis for treatment. Nasal washings and aspirates are unacceptable for Xpert Xpress SARS-CoV-2/FLU/RSV testing.  Fact Sheet for Patients: BloggerCourse.com  Fact Sheet for Healthcare Providers: SeriousBroker.it  This test is not yet approved or cleared by  the [employer] and has been authorized for detection and/or diagnosis of SARS-CoV-2 by FDA under an Emergency Use Authorization (EUA). This EUA will remain in effect (meaning this test can be used) for the duration of the COVID-19 declaration under Section 564(b)(1) of the Act, 21 U.S.C. section 360bbb-3(b)(1), unless the authorization is terminated  or revoked.     Resp Syncytial Virus by PCR NEGATIVE NEGATIVE    Comment: (NOTE) Fact Sheet for Patients: BloggerCourse.com  Fact Sheet for Healthcare Providers: SeriousBroker.it  This test is not yet approved or cleared by the [place] FDA and has been authorized for detection and/or diagnosis of SARS-CoV-2 by FDA under an Emergency Use Authorization (EUA). This EUA will remain in effect (meaning this test can be used) for the duration of the COVID-19 declaration under Section 564(b)(1) of the Act, 21 U.S.C. section 360bbb-3(b)(1), unless the authorization is terminated or revoked.  Performed at [facility], [address] [place], [place] [address]   Comprehensive metabolic panel     Status: Abnormal   Collection Time: 07/14/23  2:10 PM  Result Value Ref Range   Sodium 134 (L) 135 - 145 mmol/L   Potassium 4.2 3.5 - 5.1 mmol/L   Chloride 101 98 - 111 mmol/L   CO2 22 22 - 32 mmol/L   Glucose, Bld 91 70 - 99 mg/dL    Comment: Glucose reference range applies only to samples taken after fasting for at least 8 hours.   BUN 11 8 - 23 mg/dL   Creatinine, Ser 6.04 (H) 0.44 - 1.00 mg/dL   Calcium 9.3 8.9 - 54.0 mg/dL   Total Protein 7.7 6.5 - 8.1 g/dL   Albumin 4.2 3.5 - 5.0 g/dL   AST 44 (H) 15 - 41 U/L   ALT 23 0 - 44 U/L   Alkaline Phosphatase 74 38 - 126 U/L   Total Bilirubin 0.8 0.0 - 1.2 mg/dL   GFR, Estimated 58 (L) >60 mL/min    Comment: (NOTE) Calculated using the CKD-EPI Creatinine Equation (2021)    Anion gap 11 5 - 15    Comment: Performed at [facility], [address] [place], [place] [address]  CBC with Differential/Platelet     Status: Abnormal   Collection Time: 07/14/23  2:10 PM  Result Value Ref Range   WBC 3.4 (L) 4.0 - 10.5 K/uL   RBC 4.63 3.87 - 5.11 MIL/uL   Hemoglobin 14.1 12.0 - 15.0 g/dL   HCT 14.7 82.9 - 56.2 %   MCV 96.8 80.0 - 100.0 fL   MCH 30.5 26.0 - 34.0 pg   MCHC  31.5 30.0 - 36.0 g/dL   RDW 13.0 86.5 - 78.4 %   Platelets 210 150 - 400 K/uL   nRBC 0.0 0.0 - 0.2 %   Neutrophils Relative % 80 %   Neutro Abs 2.7 1.7 - 7.7 K/uL   Lymphocytes Relative 10 %   Lymphs Abs 0.3 (L) 0.7 - 4.0 K/uL   Monocytes Relative 8 %   Monocytes Absolute 0.3 0.1 - 1.0 K/uL   Eosinophils Relative 1 %   Eosinophils Absolute 0.0 0.0 - 0.5 K/uL   Basophils Relative 1 %   Basophils Absolute 0.0 0.0 - 0.1 K/uL   Immature Granulocytes 0 %   Abs Immature Granulocytes 0.01 0.00 - 0.07 K/uL    Comment: Performed at [facility], [address], [place], [place] [address]  Urinalysis, w/ Reflex to Culture (Infection  Suspected) -Urine, Clean Catch     Status: Abnormal   Collection Time: 07/14/23  4:09 PM  Result Value Ref Range   Specimen Source URINE, CLEAN CATCH    Color, Urine YELLOW YELLOW   APPearance CLEAR CLEAR   Specific Gravity, Urine 1.021 1.005 - 1.030   pH 5.0 5.0 - 8.0   Glucose, UA NEGATIVE NEGATIVE mg/dL   Hgb urine dipstick SMALL (A) NEGATIVE   Bilirubin Urine NEGATIVE NEGATIVE   Ketones, ur NEGATIVE NEGATIVE mg/dL   Protein, ur NEGATIVE NEGATIVE mg/dL   Nitrite NEGATIVE NEGATIVE   Leukocytes,Ua NEGATIVE NEGATIVE   RBC / HPF 0-5 0 - 5 RBC/hpf   WBC, UA 0-5 0 - 5 WBC/hpf    Comment:        Reflex urine culture not performed if WBC <=10, OR if Squamous epithelial cells >5. If Squamous epithelial cells >5 suggest recollection.    Bacteria, UA NONE SEEN NONE SEEN   Squamous Epithelial / HPF 0-5 0 - 5 /HPF   Mucus PRESENT     Comment: Performed at [facility], [address] [place], [place] [address]   DG Chest Portable 1 View Result Date: 07/14/2023 CLINICAL DATA:  Altered mental status and fever.  Dementia. EXAM: PORTABLE CHEST 1 VIEW COMPARISON:  06/21/2012 FINDINGS: Cardiac enlargement. Lungs are clear. No pleural effusion or pneumothorax. Mediastinal contours appear intact. Calcification of the aorta. IMPRESSION: No active disease.  Electronically Signed   By: Burman Nieves M.D.   On: 07/14/2023 17:19   CT HEAD WO CONTRAST ( ) Result Date: 07/14/2023 CLINICAL DATA:  Mental status changes EXAM: CT HEAD WITHOUT CONTRAST TECHNIQUE: Contiguous axial images were obtained from the base of the skull through the vertex without intravenous contrast. RADIATION DOSE REDUCTION: This exam was performed according to the departmental dose-optimization program which includes automated exposure control, adjustment of the mA and/or kV according to patient size and/or use of iterative reconstruction technique. COMPARISON:  04/07/2004 FINDINGS: Brain: Cerebral and cerebellar atrophy. Mild low density in the periventricular white matter likely related to small vessel disease. No mass lesion, hemorrhage, hydrocephalus, acute infarct, intra-axial, or extra-axial fluid collection. Vascular: No hyperdense vessel or unexpected calcification. Intracranial atherosclerosis. Skull: Normal Sinuses/Orbits: Normal imaged portions of the orbits and globes. Hypoplastic frontal sinuses. Left mastoid air cells are partially opacified. Other: None. IMPRESSION: 1.  No acute intracranial abnormality. 2.  Cerebral atrophy and small vessel ischemic change. 3. Left mastoid effusion. Electronically Signed   By: Jeronimo Greaves M.D.   On: 07/14/2023 16:43    Pending Labs Unresulted Labs (From admission, onward)     Start     Ordered   07/15/23 0500  Comprehensive metabolic panel  Tomorrow morning,   R        07/14/23 1756   07/15/23 0500  CBC  Tomorrow morning,   R        07/14/23 1756   07/15/23 0500  CK  Tomorrow morning,   R        07/14/23 1847   07/14/23 1859  Vitamin B12  Add-on,   AD        07/14/23 1858   07/14/23 1859  TSH  Add-on,   AD        07/14/23 1858   07/14/23 1852  Magnesium  Add-on,   AD        07/14/23 1851   07/14/23 1850  Osmolality  Once,   R  07/14/23 1849   07/14/23 1850  Sodium, urine, random  Once,   R        07/14/23 1849    07/14/23 1850  Osmolality, urine  Once,   R        07/14/23 1849            Vitals/Pain Today's Vitals   07/14/23 1325 07/14/23 1600 07/14/23 1615 07/14/23 1900  BP: (!) 132/97 (!) 169/90 (!) 170/87   Pulse: 96 77 83   Resp: 16 (!) 25 (!) 25   Temp: (!) 102.8 F (39.3 C)   100 F (37.8 C)  TempSrc:    Rectal  SpO2: 99% 98% 98%     Isolation Precautions Droplet precaution  Medications Medications  oseltamivir (TAMIFLU) capsule 75 mg (75 mg Oral Not Given 07/14/23 1551)  albuterol (PROVENTIL) (2.5 MG/3ML) 0.083% nebulizer solution 2.5 mg (has no administration in time range)  guaiFENesin (MUCINEX) 12 hr tablet 600 mg (has no administration in time range)  acetaminophen (TYLENOL) tablet 1,000 mg (0 mg Oral Hold 07/14/23 1901)    Or  acetaminophen (TYLENOL) suppository 650 mg ( Rectal See Alternative 07/14/23 1901)  acetaminophen (TYLENOL) suppository 650 mg (650 mg Rectal Given 07/14/23 1551)  lactated ringers bolus 1,000 mL (1,000 mLs Intravenous New Bag/Given 07/14/23 1904)    Mobility Unable     Focused Assessments Pulmonary Assessment Handoff:  Lung sounds:   O2 Device: Room Air      R Recommendations: See Admitting Provider Note  Report given to:   Additional Notes:

## 2023-07-14 NOTE — Progress Notes (Signed)
Contacted MRI - EEG tech unable to access patient for EEG right now due to pt being next for STAT MRI. Per neurologist - ok to wait until the morning since there is no tech after 1930 tonight.

## 2023-07-14 NOTE — ED Triage Notes (Signed)
PT BIB EMS from facility. Pt has dementia at baseline however is normally talkative. Pt has not been talking today and has a productive cough and tactile fever per EMS. PT Last known well was 11pm yesterday.

## 2023-07-14 NOTE — ED Provider Notes (Signed)
[place]GENCY DE[facility]MENT AT [facility] Provider Note   CSN: [id number] Arrival date & time: 07/14/23  1324    History  Chief Complaint  Patient presents with   Altered Mental Status   Cough    Tanya Gray is a 78 y.o. female hx of dementia, hypertension, PUD, CHF here for evaluation of altered mental status.  He can follow commands.  Has had a cough over the last 24 hours.  Last known normal 11 PM yesterday according to triage note?  Had a cough.  Tactile temperature.  [facility]  Baseline according to facility patient can walk, talk, smile she is able to feed herself, she knows her name, date of birth, usually knows the year according to staff.   Twanya-- POA patient is a FULL CODE  Cousin-Pt is nickname is "radio" as she is constantly talking.  Confirms baseline from what facility states. Was well up until yesterday that she was aware of   HPI     Home Medications Prior to Admission medications   Medication Sig Start Date End Date Taking? Authorizing Provider  acetaminophen (TYLENOL) 325 MG tablet Take 650 mg by mouth every 8 (eight) hours as needed for mild pain (pain score 1-3) or headache.   Yes [provider]  albuterol (VENTOLIN HFA) 108 (90 Base) MCG/ACT inhaler Inhale 2 puffs into the lungs every 4 (four) hours as needed for wheezing or shortness of breath.   Yes [provider]  aspirin EC 81 MG tablet Take 81 mg by mouth daily. Swallow whole.   Yes [provider]  B Complex Vitamins (VITAMIN B COMPLEX) TABS Take 1 tablet by mouth daily. With folic acid 03/15/21  Yes [provider]  benazepril (LOTENSIN) 40 MG tablet TAKE ONE TABLET BY MOUTH DAILY 08/02/21  Yes Tolia, Sunit, DO  busPIRone (BUSPAR) 7.5 MG tablet Take 7.5 mg by mouth 2 (two) times daily.   Yes [provider]  LORazepam (ATIVAN) 0.5 MG tablet Take 0.5 mg by mouth daily as needed for anxiety (for showers/bathing purposes).   Yes  [provider]  melatonin 3 MG TABS tablet Take 3 mg by mouth at bedtime.   Yes [provider]  metoprolol succinate (TOPROL-XL) 50 MG 24 hr tablet TAKE ONE TABLET BY MOUTH ONCE DAILY Patient taking differently: Take 25 mg by mouth daily. 08/02/21  Yes Tolia, Sunit, DO  nitroGLYCERIN (NITROSTAT) 0.4 MG SL tablet Dissolve 1 tab under tongue as needed for chest pain. May repeat every 5 minutes x 2 doses. If no relief call 9-1-1 04/14/21  Yes Tolia, Sunit, DO  OLANZapine (ZYPREXA) 2.5 MG tablet Take 2.5 mg by mouth at bedtime.   Yes [provider]  potassium chloride (KLOR-CON) 10 MEQ tablet Take 10 mEq by mouth daily. With food   Yes [provider]  pravastatin (PRAVACHOL) 80 MG tablet TAKE ONE TABLET BY MOUTH DAILY 08/02/21  Yes Tolia, Sunit, DO  traZODone (DESYREL) 50 MG tablet Take 50 mg by mouth at bedtime.   Yes [provider]  vitamin B-12 (CYANOCOBALAMIN) 1000 MCG tablet Take 1,000 mcg by mouth every morning. 03/15/21  Yes [provider]      Allergies    Patient has no known allergies.    Review of Systems   Review of Systems  Unable to perform ROS: Mental status change  All other systems reviewed and are negative.   Physical Exam Updated Vital Signs BP (!) 170/87   Pulse  83   Temp 100 F (37.8 C) (Rectal)   Resp (!) 25   SpO2 98%  Physical Exam Vitals and nursing note reviewed.  Constitutional:      General: She is not in acute distress.    Appearance: She is well-developed. She is ill-appearing (chronically ill appearing).  HENT:     Head: Atraumatic.     Nose: Nose normal.     Mouth/Throat:     Mouth: Mucous membranes are moist.  Eyes:     Pupils: Pupils are equal, round, and reactive to light.  Cardiovascular:     Rate and Rhythm: Normal rate.     Pulses:          Radial pulses are 2+ on the right side and 2+ on the left side.       Dorsalis pedis pulses are 1+ on the right side and 1+ on the left side.      Heart sounds: Normal heart sounds.  Pulmonary:     Effort: No respiratory distress.     Breath sounds: Wheezing present.     Comments: Very slight expiratory wheeze Abdominal:     General: Bowel sounds are normal. There is no distension.     Palpations: Abdomen is soft.     Tenderness: There is no abdominal tenderness. There is no right CVA tenderness, left CVA tenderness, guarding or rebound.  Genitourinary:    Comments: Erythema to sacrum Musculoskeletal:        General: Normal range of motion.     Cervical back: Normal range of motion.     Comments: Spontaneously moves extremities  Skin:    General: Skin is warm and dry.  Neurological:     Mental Status: She is alert.     Comments: No obvious facial droop, does not follow commands, PERRLA, withdraws to pain BIL, moans. Occasionally will mutter nonsensical word   Psychiatric:        Mood and Affect: Mood normal.     ED Results / Procedures / Treatments   Labs (all labs ordered are listed, but only abnormal results are displayed) Labs Reviewed  RESP PANEL BY RT-PCR (RSV, FLU A&B, COVID)  RVPGX2 - Abnormal; Notable for the following components:      Result Value   Influenza A by PCR POSITIVE (*)    All other components within normal limits  COMPREHENSIVE METABOLIC PANEL - Abnormal; Notable for the following components:   Sodium 134 (*)    Creatinine, Ser 1.01 (*)    AST 44 (*)    GFR, Estimated 58 (*)    All other components within normal limits  CBC WITH DIFFERENTIAL/PLATELET - Abnormal; Notable for the following components:   WBC 3.4 (*)    Lymphs Abs 0.3 (*)    All other components within normal limits  URINALYSIS, W/ REFLEX TO CULTURE (INFECTION SUSPECTED) - Abnormal; Notable for the following components:   Hgb urine dipstick SMALL (*)    All other components within normal limits  COMPREHENSIVE METABOLIC PANEL  CBC  CK  OSMOLALITY  SODIUM, URINE, RANDOM  OSMOLALITY, URINE  MAGNESIUM  VITAMIN B12  TSH     EKG EKG Interpretation Date/Time:  Sunday July 14 2023 16:00:34 EST Ventricular Rate:  76 PR Interval:  161 QRS Duration:  138 QT Interval:  396 QTC Calculation: 446 R Axis:   68  Text Interpretation: Sinus rhythm LAE, consider biatrial enlargement Left bundle branch block Confirmed by Alvino Blood ([id number]) on 07/14/2023 5:46:06 PM  Radiology DG Chest Portable 1 View Result Date: 07/14/2023 CLINICAL DATA:  Altered mental status and fever.  Dementia. EXAM: PORTABLE CHEST 1 VIEW COMPARISON:  06/21/2012 FINDINGS: Cardiac enlargement. Lungs are clear. No pleural effusion or pneumothorax. Mediastinal contours appear intact. Calcification of the aorta. IMPRESSION: No active disease. Electronically Signed   By: Burman Nieves M.D.   On: 07/14/2023 17:19   CT HEAD WO CONTRAST ( ) Result Date: 07/14/2023 CLINICAL DATA:  Mental status changes EXAM: CT HEAD WITHOUT CONTRAST TECHNIQUE: Contiguous axial images were obtained from the base of the skull through the vertex without intravenous contrast. RADIATION DOSE REDUCTION: This exam was performed according to the departmental dose-optimization program which includes automated exposure control, adjustment of the mA and/or kV according to patient size and/or use of iterative reconstruction technique. COMPARISON:  04/07/2004 FINDINGS: Brain: Cerebral and cerebellar atrophy. Mild low density in the periventricular white matter likely related to small vessel disease. No mass lesion, hemorrhage, hydrocephalus, acute infarct, intra-axial, or extra-axial fluid collection. Vascular: No hyperdense vessel or unexpected calcification. Intracranial atherosclerosis. Skull: Normal Sinuses/Orbits: Normal imaged portions of the orbits and globes. Hypoplastic frontal sinuses. Left mastoid air cells are partially opacified. Other: None. IMPRESSION: 1.  No acute intracranial abnormality. 2.  Cerebral atrophy and small vessel ischemic change. 3. Left mastoid effusion.  Electronically Signed   By: Jeronimo Greaves M.D.   On: 07/14/2023 16:43    Procedures Procedures    Medications Ordered in ED Medications  oseltamivir (TAMIFLU) capsule 75 mg (75 mg Oral Not Given 07/14/23 1551)  albuterol (PROVENTIL) (2.5 MG/3ML) 0.083% nebulizer solution 2.5 mg (has no administration in time range)  guaiFENesin (MUCINEX) 12 hr tablet 600 mg (has no administration in time range)  acetaminophen (TYLENOL) tablet 1,000 mg (0 mg Oral Hold 07/14/23 1901)    Or  acetaminophen (TYLENOL) suppository 650 mg ( Rectal See Alternative 07/14/23 1901)  acetaminophen (TYLENOL) suppository 650 mg (650 mg Rectal Given 07/14/23 1551)  lactated ringers bolus 1,000 mL (1,000 mLs Intravenous New Bag/Given 07/14/23 1904)    ED Course/ Medical Decision Making/ A&P Clinical Course as of 07/14/23 Gustavus Bryant Jul 14, 2023  1741 IMTS- Lily Kocher will see for admission [BH]    Clinical Course User Index [BH] Sheilyn Boehlke A, PA-C   78 year old multiple medical problems including dementia here for evaluation of altered mental status.  Apparently at baseline typically talkative however nonsensical at baseline.  Facility noted she was not talking today and was withdrawn.  EMS states she had a tactile fever, cough.  Last known normal sometime yesterday evening, possibly 11 PM? Not entirely sure.  My exam she is febrile, mildly tachycardic.  She does not follow commands.  She does withdraw bilaterally to pain.  No obvious facial asymmetry.  She is nonverbal.  Her workup was started from triage.  Will plan on adding on CT head, x-ray chest, urine and reassess.  Rectal Tylenol to be given for fever. Not a code stroke, No LVO criteria  Labs and imaging personally viewed and interpreted: CBC leukopenia at 3.4 CMP sodium 134, creatinine 1.01 (baseline compared to prior visits) Influenza POSITIVE UA neg for infection CT head without acute abnormality DG chest without cardiomegaly, pulm edema, pneumothorax,  infiltrates EKG without ischemic changes   Patient reassessed.  Same neuro exam. Will admit for further workup.  Discussed with IM teaching Lily Kocher who will evaluate for admission.  Given seem to have somewhat of an abrupt onset discussed with teaching service to obtain brain MRI  to ensure no underlying CVA as cause of her altered mental status.  The patient appears reasonably stabilized for admission considering the current resources, flow, and capabilities available in the ED at this time, and I doubt any other [facility] requiring further screening and/or treatment in the ED prior to admission.                                Medical Decision Making Amount and/or Complexity of Data Reviewed Independent Historian: EMS External Data Reviewed: labs, radiology, ECG and notes. Labs: ordered. Decision-making details documented in ED Course. Radiology: ordered and independent interpretation performed. Decision-making details documented in ED Course. ECG/medicine tests: ordered and independent interpretation performed. Decision-making details documented in ED Course.  Risk OTC drugs. Prescription drug management. Decision regarding hospitalization. Diagnosis or treatment significantly limited by social determinants of health.         Final Clinical Impression(s) / ED Diagnoses Final diagnoses:  Influenza A  Altered mental status, unspecified altered mental status type  History of dementia    Rx / DC Orders ED Discharge Orders     None         Lucile Didonato A, PA-C 07/14/23 1908    Lonell Grandchild, MD 07/14/23 2044

## 2023-07-14 NOTE — H&P (Cosign Needed Addendum)
Date: 07/14/2023               Patient Name:  Tanya Gray MRN: [id number]  DOB: [birth year] Age / Sex: 78 y.o., female   PCP: Pcp, No         Medical Service: Internal Medicine Teaching Service         Attending Physician: Dr. Ginnie Smart, MD      First Contact: Dr. Manuela Neptune, MD Pager [phone]    Second Contact: Dr. Morene Crocker, MD Pager [phone]         After Hours (After 5p/  First Contact Pager: [phone]  weekends / holidays): Second Contact Pager: [phone]   SUBJECTIVE   Chief Complaint:  Chief Complaint  Patient presents with   Altered Mental Status   Cough   History of Present Illness:   Tanya Gray is a 78 y.o. with a PMH significant for HFpEF, CAD, HTN, PUD, and Dementia with behavioral disturbance was bib EMS because of AMS. Most of history collected from tech at facility and chart review. Pt was at her normal baseline, talkative with staff, got ready for bed. Woke up and sat on the chair, was walking then. When staff tried to stand her up, her legs gave up on her and she was not herself, not responding to questions, was drooling and twitching. No history of seizures. She took mucinex for a cold that has been present for about a week. She was unable to eat lunch. Usually she will eat her entire plate and snacks. Had no fevers, chills. No N/V, diarrhea. LKN 11pm   Meds:   Pravastatin 80 mg Trazodone 50 mg at bedtime Metoprolol succinate 25 mg daily Nitroglycerin 0.4, not recently given Melatonin 3 mg nightly Lorazepam 0.5 mg PRN for showers Nitroglycerine Olanzapine 2.5 mg at bedtime Albuterol 90 q4H PRN Benazepril 40 mg every morning Buspirone 7.5 mg BID - last one was this morning B12 - complex - daily  ASA 81 daily Acetaminophen PRN TID  Meds given this AM and last night.   No Known Allergies  Past Medical History:  Diagnosis Date   AORTIC INSUFFICIENCY 08/03/2009   Cardiomegaly 01/29/2007   CHF (congestive  heart failure) (HCC)    Coronary artery disease    Diverticulosis of colon (without mention of hemorrhage) 08/08/2006   ESOPHAGEAL STRICTURE 02/19/2008   Heartburn 02/19/2008   HYPERLIPIDEMIA 02/18/2008   HYPERTENSION, BENIGN SYSTEMIC 08/08/2006   MYALGIA 01/29/2007   OSTEOARTHRITIS 12/21/2008   PEPTIC ULCER DISEASE 12/21/2008   RHINITIS, ALLERGIC 08/08/2006   Unspecified asthma(493.90) 08/08/2006   Past Surgical History:  Procedure Laterality Date   ABDOMINAL HYSTERECTOMY     CARDIAC CATHETERIZATION     ENDOTRACHEAL INTUBATION EMERGENT     ESOPHAGOGASTRODUODENOSCOPY     LEFT HEART CATH AND CORONARY ANGIOGRAPHY N/A 02/23/2020   Procedure: LEFT HEART CATH AND CORONARY ANGIOGRAPHY;  Surgeon: Elder Negus, MD;  Location: [facility];  Service: Cardiovascular;  Laterality: N/A;   Family History  Problem Relation Age of Onset   Diabetes Mother    Hypertension Mother    Lung disease Mother    Aneurysm Sister        cerebral   Colon cancer Neg Hx    Esophageal cancer Neg Hx    Rectal cancer Neg Hx    Stomach cancer Neg Hx    Social:   Hulda Humphrey is POA, [phone] Level of Function: independent in all ADLS, assistance with IADLs PCP: At facility,  [facility] Substances: Does not smoke. Unsure if she smoked in the past.   Review of Systems: A complete ROS was negative except as per HPI.   OBJECTIVE:   Physical Exam: Blood pressure (!) 170/87, pulse 83, temperature (!) 102.8 F (39.3 C), resp. rate (!) 25, SpO2 98%.  Constitutional: laying in bed in no acute distress, does not wake up to sternal rub HENT: normocephalic atraumatic, mucous membranes dry Eyes: conjunctiva injected, Pupils not reactive to light Neck: supple, no JVD Cardiovascular: Systolic murmur, no ble edema Pulmonary/Chest: tachypneic, wheezing, not cyanotic. Abdominal: soft, non-tender, non-distended, no suprapubic pain or mass MSK: normal bulk and tone Neurological: Increased tone in bilateral  upper extremities, does not obey commands, does not withdraw to pain, no sounds.  Skin: warm and dry, no overt wounds.  Labs: CBC    Component Value Date/Time   WBC 3.4 (L) 07/14/2023 1410   RBC 4.63 07/14/2023 1410   HGB 14.1 07/14/2023 1410   HGB 12.3 02/18/2020 1116   HCT 44.8 07/14/2023 1410   HCT 36.7 02/18/2020 1116   PLT 210 07/14/2023 1410   PLT 234 02/18/2020 1116   MCV 96.8 07/14/2023 1410   MCV 97 02/18/2020 1116   MCH 30.5 07/14/2023 1410   MCHC 31.5 07/14/2023 1410   RDW 13.4 07/14/2023 1410   RDW 12.4 02/18/2020 1116   LYMPHSABS 0.3 (L) 07/14/2023 1410   MONOABS 0.3 07/14/2023 1410   EOSABS 0.0 07/14/2023 1410   BASOSABS 0.0 07/14/2023 1410     CMP     Component Value Date/Time   NA 134 (L) 07/14/2023 1410   NA 137 03/17/2021 1037   K 4.2 07/14/2023 1410   CL 101 07/14/2023 1410   CO2 22 07/14/2023 1410   GLUCOSE 91 07/14/2023 1410   BUN 11 07/14/2023 1410   BUN 22 03/17/2021 1037   CREATININE 1.01 (H) 07/14/2023 1410   CALCIUM 9.3 07/14/2023 1410   PROT 7.7 07/14/2023 1410   ALBUMIN 4.2 07/14/2023 1410   AST 44 (H) 07/14/2023 1410   ALT 23 07/14/2023 1410   ALKPHOS 74 07/14/2023 1410   BILITOT 0.8 07/14/2023 1410   GFRNONAA 58 (L) 07/14/2023 1410   GFRAA 61 07/28/2020 1144   DG Chest Portable 1 View Result Date: 07/14/2023 CLINICAL DATA:  Altered mental status and fever.  Dementia. EXAM: PORTABLE CHEST 1 VIEW COMPARISON:  06/21/2012 FINDINGS: Cardiac enlargement. Lungs are clear. No pleural effusion or pneumothorax. Mediastinal contours appear intact. Calcification of the aorta. IMPRESSION: No active disease. Electronically Signed   By: Burman Nieves M.D.   On: 07/14/2023 17:19   CT HEAD WO CONTRAST ( ) Result Date: 07/14/2023 CLINICAL DATA:  Mental status changes EXAM: CT HEAD WITHOUT CONTRAST TECHNIQUE: Contiguous axial images were obtained from the base of the skull through the vertex without intravenous contrast. RADIATION DOSE REDUCTION:  This exam was performed according to the departmental dose-optimization program which includes automated exposure control, adjustment of the mA and/or kV according to patient size and/or use of iterative reconstruction technique. COMPARISON:  04/07/2004 FINDINGS: Brain: Cerebral and cerebellar atrophy. Mild low density in the periventricular white matter likely related to small vessel disease. No mass lesion, hemorrhage, hydrocephalus, acute infarct, intra-axial, or extra-axial fluid collection. Vascular: No hyperdense vessel or unexpected calcification. Intracranial atherosclerosis. Skull: Normal Sinuses/Orbits: Normal imaged portions of the orbits and globes. Hypoplastic frontal sinuses. Left mastoid air cells are partially opacified. Other: None. IMPRESSION: 1.  No acute intracranial abnormality. 2.  Cerebral atrophy and small  vessel ischemic change. 3. Left mastoid effusion. Electronically Signed   By: Jeronimo Greaves M.D.   On: 07/14/2023 16:43   Imaging: Patchy infiltrates, possible cardiomegaly?   EKG:  LBBB   ASSESSMENT & PLAN:   Assessment & Plan by Problem: Principal Problem:   Acute encephalopathy  Kamyla Olejnik Murren is a 78 y.o. with a PMH of HLD, HTN, Aortic valve disorder, Dementia, coming in with abrupt onset AMS admitted for acute encephalopathy.   Acute Encephalopathy  Influenza A Hyponatremia  Patient is not waking up even to sternal rub. She has a decorticate posture with tension on the bilateral UE. Pupils not reactive to light, conjunctiva present. CT not concerning for a stroke, but her abrupt onset and sxs are concerning for a stroke. Will do an MRI. She could also have had a seizure. This could also be infectious or exacerbated by an infectious cause but she is deeply somnolent. She is positive for Influenza A. She is wheezing, she is febrile and tachypneic. WBC is 3.4, with mild lymphopenia and no increased neutrophils.  CXR showing interstitial infiltrates. Could be because  of medications, she is on many central acting medications that we will hold right now. Last time she got them was last night. Thinking about other differentials, she has normal kidney function with a Cr 1.01 and BUN 11, K is 4.2. Glucose WNL at 91 with no AG, Sodium is mildly decreased at 134. AST mildly elevated at 44 ALT 23 T billi 0.8 but I doubt it would be causing these symptoms and possibly related to dehydration and decreased PO intake. Will add on urine studies. She is dry, mild edema on the feet, no JVD. UA not concerning for UTI, could possibly have rhabdo 2/2 influenza, will add on a CK although I do not think this is causing her encephalopathy. Will check TSH. Doubt it is B12 but will measure, she takes this every day.   -Blood cultures  -MR brain without contrast -B12  -TSH  -Urine studies  -Serum osmolarity -hold centrally acting meds -CK  -telemetry  -SCDs for VTE ppx for now -Echo  -Delirium precautions  -Bladder scan  -CBC with differential tomorrow Am  -CMP  -LR bolus  -albuterol nebulized  -Tylenol PRN  -SLP eval when mentation improves -Strict I/Os  -Oseltamivir  HFpEF  Last echo on record here was 12/2019 showed an EF of 65-70% and grade II diastolic dysfunction. Dry on exam but has a murmur and calcified aortic valve. Will do an echo. Not on furosemide at home. Holding Metoprolol 25 mg daily and nitroglycerine.  -Echo   Dementia with behavioral disturbances At baseline she is interactive and recognizes people in the facility. She is on Trazodone 50mg  at bedtime, Meltonin 3mg  nightly, Lorazepam 0.5 mg PRN for showers, Olanzapine 2.5mg  at bedtime (should hold this in dementia unless agitated due to increased risk of death), Buspirone 7.5mg  BID. Will hold all these for now.  HLD  Hold pravastatin 80mg  daily, ASA 81mg  daily   HTN  Benazepril 40mg  daily. Will hold as she is NPO. Will allow for permissive HTN until MRI is done.     Diet: NPO VTE: SCDs IVF:  NS,None Code: Full  Prior to Admission Living Arrangement: facility, [facility] Anticipated Discharge Location: back to facility Barriers to Discharge: Medical Work-up   Dispo: Admit patient to Inpatient with expected length of stay greater than 2 midnights.  Signed: [facility]  Internal Medicine Resident, PGY-1 [facility]  Pager: [phone]  07/14/2023, 6:03 PM

## 2023-07-14 NOTE — Hospital Course (Addendum)
AMS -  Hyponatremia - urine studies Influenza A positive  Cough that started yesterday  Facilty - [facility]  11PM  Talking, walking, trying to help her nursing. Eating a bowl of pudding. Interacting with the stuff.  Somnolent today Lunch - was not talking Twiching, slurring, and drooling. Was not he  Cold - mucinex for about a week. She did not eat lunch today, but she has been finishing her cold  Fevers, chills. None   Yesterday was known normal PO intake - abrupt change from yesterday  CT nothing acute  MRI brain no stroke  Pravastatin 80 mg Trazodone 50 mg at bedtime Metoprolol succinate 25 mg daily Nitroglycerin 0.4, not recently given Melatonin 3 mg nightly Lorazepam 0.5 mg PRN for showers Nitroglycerine Olanzapine 2.5 mg at bedtime Albuterol 90 q4H PRN Benazepril 40 mg every morning Buspirone 7.5 mg BID - last one was this morning B12 - complex - daily  ASA 81 daily Acetaminophen PRN TID  Knee hurt last week  Code - Full Code Family information: POA Hulda Humphrey [phone]    Lisbeth Renshaw - med tech

## 2023-07-14 NOTE — ED Provider Triage Note (Signed)
Emergency Medicine Provider Triage Evaluation Note  Tanya Gray , a 78 y.o. female  was evaluated in triage.  Pt complains of altered mental status.  Patient has dementia, level 5 caveat.  Per EMS patient is typically talkative, though nonsensically at baseline.  Today staff at her facility noticed she was not talking, was withdrawn.  EMS reports the patient had tactile fever, was otherwise hemodynamically unremarkable in transport.  Review of Systems  Positive: Altered mental status Negative: Fall, noted trauma, change in medication  Physical Exam  BP (!) 132/97   Pulse 96   Temp (!) 102.8 F (39.3 C)   Resp 16   SpO2 99%  Gen:   Awake, no distress elderly female sitting upright Resp:  Normal effort no increased work of breathing MSK:   Moves extremities without difficulty no obvious deformity Other:  Neuro, patient does not follow commands, no facial asymmetry, nonverbal currently  Medical Decision Making  Medically screening exam initiated at 1:27 PM.  Appropriate orders placed.  Tanya Gray was informed that the remainder of the evaluation will be completed by another provider, this initial triage assessment does not replace that evaluation, and the importance of remaining in the ED until their evaluation is complete.   Tanya Munch, MD 07/14/23 1328

## 2023-07-14 NOTE — H&P (Incomplete)
Internal Medicine Teaching Service Attending Note Date: 07/14/2023  Patient name: Tanya Gray  Medical record number: [id number]  Date of birth: [birth year]   I have seen and evaluated Tanya Gray and discussed their care with the Residency Team.   78 yo F with hx of HTN, dementia, behavioral disturbance, normally feeds, self, talkative and ambulatory, today became less interactive, and developed worsened cough. Per the SNF staff, she has had a "cold" for the past week. She was eating lunch today and became twitchy.  In ED was found to be febrile to 102.8, flu+ with a clear CXR. Her WBC is slightly low at 3.4.  On exam, she interacts minimally. She does open her eyes with stimulation. Does not verbalize.   Physical Exam: Blood pressure (!) 170/87, pulse 83, temperature (!) 102.8 F (39.3 C), resp. rate (!) 25, SpO2 98%. General appearance: no distress and uncooperative Eyes: negative findings: EOMI Neck: no adenopathy Resp: wheezes bilaterally Cardio: regular rate and rhythm and systolic murmur: early systolic 2/6, crescendo at 2nd right intercostal space GI: normal findings: bowel sounds normal and soft, non-tender Extremities: edema trace Skin: no LE skin breakdown.   Lab results: Results for orders placed or performed during the hospital encounter of 07/14/23 (from the past 24 hours)  Resp panel by RT-PCR (RSV, Flu A&B, Covid) Anterior Nasal Swab     Status: Abnormal   Collection Time: 07/14/23  1:27 PM   Specimen: Anterior Nasal Swab  Result Value Ref Range   SARS Coronavirus 2 by RT PCR NEGATIVE NEGATIVE   Influenza A by PCR POSITIVE (A) NEGATIVE   Influenza B by PCR NEGATIVE NEGATIVE   Resp Syncytial Virus by PCR NEGATIVE NEGATIVE  Comprehensive metabolic panel     Status: Abnormal   Collection Time: 07/14/23  2:10 PM  Result Value Ref Range   Sodium 134 (L) 135 - 145 mmol/L   Potassium 4.2 3.5 - 5.1 mmol/L   Chloride 101 98 - 111 mmol/L   CO2 22 22 - 32  mmol/L   Glucose, Bld 91 70 - 99 mg/dL   BUN 11 8 - 23 mg/dL   Creatinine, Ser 4.09 (H) 0.44 - 1.00 mg/dL   Calcium 9.3 8.9 - 81.1 mg/dL   Total Protein 7.7 6.5 - 8.1 g/dL   Albumin 4.2 3.5 - 5.0 g/dL   AST 44 (H) 15 - 41 U/L   ALT 23 0 - 44 U/L   Alkaline Phosphatase 74 38 - 126 U/L   Total Bilirubin 0.8 0.0 - 1.2 mg/dL   GFR, Estimated 58 (L) >60 mL/min   Anion gap 11 5 - 15  CBC with Differential/Platelet     Status: Abnormal   Collection Time: 07/14/23  2:10 PM  Result Value Ref Range   WBC 3.4 (L) 4.0 - 10.5 K/uL   RBC 4.63 3.87 - 5.11 MIL/uL   Hemoglobin 14.1 12.0 - 15.0 g/dL   HCT 91.4 78.2 - 95.6 %   MCV 96.8 80.0 - 100.0 fL   MCH 30.5 26.0 - 34.0 pg   MCHC 31.5 30.0 - 36.0 g/dL   RDW 21.3 08.6 - 57.8 %   Platelets 210 150 - 400 K/uL   nRBC 0.0 0.0 - 0.2 %   Neutrophils Relative % 80 %   Neutro Abs 2.7 1.7 - 7.7 K/uL   Lymphocytes Relative 10 %   Lymphs Abs 0.3 (L) 0.7 - 4.0 K/uL   Monocytes Relative 8 %   Monocytes Absolute 0.3  0.1 - 1.0 K/uL   Eosinophils Relative 1 %   Eosinophils Absolute 0.0 0.0 - 0.5 K/uL   Basophils Relative 1 %   Basophils Absolute 0.0 0.0 - 0.1 K/uL   Immature Granulocytes 0 %   Abs Immature Granulocytes 0.01 0.00 - 0.07 K/uL  Urinalysis, w/ Reflex to Culture (Infection Suspected) -Urine, Clean Catch     Status: Abnormal   Collection Time: 07/14/23  4:09 PM  Result Value Ref Range   Specimen Source URINE, CLEAN CATCH    Color, Urine YELLOW YELLOW   APPearance CLEAR CLEAR   Specific Gravity, Urine 1.021 1.005 - 1.030   pH 5.0 5.0 - 8.0   Glucose, UA NEGATIVE NEGATIVE mg/dL   Hgb urine dipstick SMALL (A) NEGATIVE   Bilirubin Urine NEGATIVE NEGATIVE   Ketones, ur NEGATIVE NEGATIVE mg/dL   Protein, ur NEGATIVE NEGATIVE mg/dL   Nitrite NEGATIVE NEGATIVE   Leukocytes,Ua NEGATIVE NEGATIVE   RBC / HPF 0-5 0 - 5 RBC/hpf   WBC, UA 0-5 0 - 5 WBC/hpf   Bacteria, UA NONE SEEN NONE SEEN   Squamous Epithelial / HPF 0-5 0 - 5 /HPF   Mucus  PRESENT     Imaging results:  DG Chest Portable 1 View Result Date: 07/14/2023 CLINICAL DATA:  Altered mental status and fever.  Dementia. EXAM: PORTABLE CHEST 1 VIEW COMPARISON:  06/21/2012 FINDINGS: Cardiac enlargement. Lungs are clear. No pleural effusion or pneumothorax. Mediastinal contours appear intact. Calcification of the aorta. IMPRESSION: No active disease. Electronically Signed   By: Burman Nieves M.D.   On: 07/14/2023 17:19   CT HEAD WO CONTRAST ( ) Result Date: 07/14/2023 CLINICAL DATA:  Mental status changes EXAM: CT HEAD WITHOUT CONTRAST TECHNIQUE: Contiguous axial images were obtained from the base of the skull through the vertex without intravenous contrast. RADIATION DOSE REDUCTION: This exam was performed according to the departmental dose-optimization program which includes automated exposure control, adjustment of the mA and/or kV according to patient size and/or use of iterative reconstruction technique. COMPARISON:  04/07/2004 FINDINGS: Brain: Cerebral and cerebellar atrophy. Mild low density in the periventricular white matter likely related to small vessel disease. No mass lesion, hemorrhage, hydrocephalus, acute infarct, intra-axial, or extra-axial fluid collection. Vascular: No hyperdense vessel or unexpected calcification. Intracranial atherosclerosis. Skull: Normal Sinuses/Orbits: Normal imaged portions of the orbits and globes. Hypoplastic frontal sinuses. Left mastoid air cells are partially opacified. Other: None. IMPRESSION: 1.  No acute intracranial abnormality. 2.  Cerebral atrophy and small vessel ischemic change. 3. Left mastoid effusion. Electronically Signed   By: Jeronimo Greaves M.D.   On: 07/14/2023 16:43   ECG: NSR 76, LBBB  Assessment and Plan: I agree with the formulated Assessment and Plan with the following changes:  Influenza Dementia Heart Murmur  Spoke to SNF, not sure of her baseline (usually ambulatory, feeds self). Will treat her with  tamiflu Will hydrate her gently.  Anti-pyretics as needed.  Will repeat her TTE.  Not clear is she has mild CM on cephalization on her CXR. Will check spot EEG and MRI.  SNF notified of Flu+ Family notified of admission by SNF  Tanya Gray, Lacretia Leigh, MD

## 2023-07-15 ENCOUNTER — Inpatient Hospital Stay (HOSPITAL_COMMUNITY): Payer: 59

## 2023-07-15 ENCOUNTER — Observation Stay (HOSPITAL_COMMUNITY): Payer: 59

## 2023-07-15 DIAGNOSIS — Z8249 Family history of ischemic heart disease and other diseases of the circulatory system: Secondary | ICD-10-CM | POA: Diagnosis not present

## 2023-07-15 DIAGNOSIS — I5032 Chronic diastolic (congestive) heart failure: Secondary | ICD-10-CM | POA: Diagnosis present

## 2023-07-15 DIAGNOSIS — Z8659 Personal history of other mental and behavioral disorders: Secondary | ICD-10-CM | POA: Diagnosis not present

## 2023-07-15 DIAGNOSIS — E871 Hypo-osmolality and hyponatremia: Secondary | ICD-10-CM | POA: Diagnosis present

## 2023-07-15 DIAGNOSIS — I159 Secondary hypertension, unspecified: Secondary | ICD-10-CM | POA: Diagnosis not present

## 2023-07-15 DIAGNOSIS — E785 Hyperlipidemia, unspecified: Secondary | ICD-10-CM | POA: Diagnosis present

## 2023-07-15 DIAGNOSIS — D72810 Lymphocytopenia: Secondary | ICD-10-CM | POA: Diagnosis present

## 2023-07-15 DIAGNOSIS — R4182 Altered mental status, unspecified: Secondary | ICD-10-CM

## 2023-07-15 DIAGNOSIS — Z79899 Other long term (current) drug therapy: Secondary | ICD-10-CM | POA: Diagnosis not present

## 2023-07-15 DIAGNOSIS — Z7982 Long term (current) use of aspirin: Secondary | ICD-10-CM | POA: Diagnosis not present

## 2023-07-15 DIAGNOSIS — J101 Influenza due to other identified influenza virus with other respiratory manifestations: Secondary | ICD-10-CM | POA: Diagnosis present

## 2023-07-15 DIAGNOSIS — E860 Dehydration: Secondary | ICD-10-CM | POA: Diagnosis present

## 2023-07-15 DIAGNOSIS — R32 Unspecified urinary incontinence: Secondary | ICD-10-CM | POA: Diagnosis present

## 2023-07-15 DIAGNOSIS — F03918 Unspecified dementia, unspecified severity, with other behavioral disturbance: Secondary | ICD-10-CM | POA: Diagnosis present

## 2023-07-15 DIAGNOSIS — I2510 Atherosclerotic heart disease of native coronary artery without angina pectoris: Secondary | ICD-10-CM | POA: Diagnosis present

## 2023-07-15 DIAGNOSIS — R569 Unspecified convulsions: Secondary | ICD-10-CM | POA: Diagnosis not present

## 2023-07-15 DIAGNOSIS — Z833 Family history of diabetes mellitus: Secondary | ICD-10-CM | POA: Diagnosis not present

## 2023-07-15 DIAGNOSIS — I110 Hypertensive heart disease with heart failure: Secondary | ICD-10-CM | POA: Diagnosis present

## 2023-07-15 DIAGNOSIS — G9340 Encephalopathy, unspecified: Secondary | ICD-10-CM | POA: Diagnosis not present

## 2023-07-15 DIAGNOSIS — I371 Nonrheumatic pulmonary valve insufficiency: Secondary | ICD-10-CM | POA: Diagnosis present

## 2023-07-15 DIAGNOSIS — I7121 Aneurysm of the ascending aorta, without rupture: Secondary | ICD-10-CM | POA: Diagnosis present

## 2023-07-15 DIAGNOSIS — G9341 Metabolic encephalopathy: Secondary | ICD-10-CM | POA: Diagnosis present

## 2023-07-15 DIAGNOSIS — J45909 Unspecified asthma, uncomplicated: Secondary | ICD-10-CM | POA: Diagnosis present

## 2023-07-15 DIAGNOSIS — Z8711 Personal history of peptic ulcer disease: Secondary | ICD-10-CM | POA: Diagnosis not present

## 2023-07-15 DIAGNOSIS — R011 Cardiac murmur, unspecified: Secondary | ICD-10-CM | POA: Diagnosis not present

## 2023-07-15 DIAGNOSIS — J1081 Influenza due to other identified influenza virus with encephalopathy: Secondary | ICD-10-CM | POA: Diagnosis present

## 2023-07-15 DIAGNOSIS — Z90710 Acquired absence of both cervix and uterus: Secondary | ICD-10-CM | POA: Diagnosis not present

## 2023-07-15 DIAGNOSIS — Z1152 Encounter for screening for COVID-19: Secondary | ICD-10-CM | POA: Diagnosis not present

## 2023-07-15 DIAGNOSIS — E162 Hypoglycemia, unspecified: Secondary | ICD-10-CM | POA: Insufficient documentation

## 2023-07-15 DIAGNOSIS — I358 Other nonrheumatic aortic valve disorders: Secondary | ICD-10-CM | POA: Diagnosis present

## 2023-07-15 LAB — COMPREHENSIVE METABOLIC PANEL
ALT: 20 U/L (ref 0–44)
AST: 40 U/L (ref 15–41)
Albumin: 3.2 g/dL — ABNORMAL LOW (ref 3.5–5.0)
Alkaline Phosphatase: 55 U/L (ref 38–126)
Anion gap: 11 (ref 5–15)
BUN: 11 mg/dL (ref 8–23)
CO2: 23 mmol/L (ref 22–32)
Calcium: 8.4 mg/dL — ABNORMAL LOW (ref 8.9–10.3)
Chloride: 103 mmol/L (ref 98–111)
Creatinine, Ser: 0.99 mg/dL (ref 0.44–1.00)
GFR, Estimated: 59 mL/min — ABNORMAL LOW (ref >60)
Glucose, Bld: 108 mg/dL — ABNORMAL HIGH (ref 70–99)
Potassium: 3.7 mmol/L (ref 3.5–5.1)
Sodium: 137 mmol/L (ref 135–145)
Total Bilirubin: 0.3 mg/dL (ref 0.0–1.2)
Total Protein: 5.8 g/dL — ABNORMAL LOW (ref 6.5–8.1)

## 2023-07-15 LAB — CBC
HCT: 37.6 % (ref 36.0–46.0)
Hemoglobin: 12.3 g/dL (ref 12.0–15.0)
MCH: 30.5 pg (ref 26.0–34.0)
MCHC: 32.7 g/dL (ref 30.0–36.0)
MCV: 93.3 fL (ref 80.0–100.0)
Platelets: 184 10*3/uL (ref 150–400)
RBC: 4.03 MIL/uL (ref 3.87–5.11)
RDW: 13.5 % (ref 11.5–15.5)
WBC: 3.1 10*3/uL — ABNORMAL LOW (ref 4.0–10.5)
nRBC: 0 % (ref 0.0–0.2)

## 2023-07-15 LAB — CK: Total CK: 541 U/L — ABNORMAL HIGH (ref 38–234)

## 2023-07-15 MED ORDER — OSELTAMIVIR PHOSPHATE 30 MG PO CAPS: 30.0000 mg | ORAL_CAPSULE | Freq: Two times a day (BID) | ORAL | Status: DC

## 2023-07-15 MED ORDER — OSELTAMIVIR PHOSPHATE 75 MG PO CAPS
75.0000 mg | ORAL_CAPSULE | Freq: Once | ORAL | Status: AC
  Administered 2023-07-15: 75 mg via ORAL
  Filled 2023-07-15: qty 1

## 2023-07-15 MED ORDER — LORAZEPAM 2 MG/ML IJ SOLN
0.5000 mg | Freq: Once | INTRAMUSCULAR | Status: AC
  Administered 2023-07-15: 0.5 mg via INTRAVENOUS
  Filled 2023-07-15: qty 1

## 2023-07-15 MED ORDER — TRAZODONE HCL 50 MG PO TABS
50.0000 mg | ORAL_TABLET | Freq: Every day | ORAL | Status: DC
  Administered 2023-07-15 – 2023-07-18 (×3): 50 mg via ORAL
  Filled 2023-07-15 (×4): qty 1

## 2023-07-15 MED ORDER — OSELTAMIVIR PHOSPHATE 30 MG PO CAPS
30.0000 mg | ORAL_CAPSULE | Freq: Two times a day (BID) | ORAL | Status: DC
Start: 2023-07-15 — End: 2023-07-19
  Administered 2023-07-15 – 2023-07-19 (×7): 30 mg via ORAL
  Filled 2023-07-15 (×10): qty 1

## 2023-07-15 MED ORDER — LACTATED RINGERS IV BOLUS
1000.0000 mL | Freq: Once | INTRAVENOUS | Status: AC
  Administered 2023-07-15: 1000 mL via INTRAVENOUS

## 2023-07-15 MED ORDER — SENNA 8.6 MG PO TABS: 1.0000 | ORAL_TABLET | Freq: Every day | ORAL | Status: DC | PRN

## 2023-07-15 MED ORDER — ALBUTEROL SULFATE (2.5 MG/3ML) 0.083% IN NEBU: 2.5000 mg | INHALATION_SOLUTION | Freq: Four times a day (QID) | RESPIRATORY_TRACT | Status: DC | PRN

## 2023-07-15 MED ORDER — BUSPIRONE HCL 5 MG PO TABS
7.5000 mg | ORAL_TABLET | Freq: Two times a day (BID) | ORAL | Status: DC
  Administered 2023-07-15 – 2023-07-19 (×8): 7.5 mg via ORAL
  Filled 2023-07-15 (×9): qty 2

## 2023-07-15 NOTE — Progress Notes (Signed)
Transition of Care [facility]) - Inpatient Brief Assessment   Patient Details  Name: Tanya Gray MRN: [id number] Date of Birth: 23-Sep-1945  [facility] CM/SW Contact:    Janae Bridgeman, RN Phone Number: 07/15/2023, 10:58 AM   Clinical Narrative: Patient admitted for metabolic encephalopathy.  I met with the patient at the bedside to discuss TOC needs.  Patient unable to arouse this morning due to illness and dementia.  I called the patient's cousin, Kathee Delton, and provided Medicare Observation notice and copy of the document was provided at the bedside since patient was unable to sign.  Patient lives at [facility] for past year.  Patient is normally ambulatory at baseline.  No other TOC needs at this time.   Transition of Care Asessment: Insurance and Status: (P) Insurance coverage has been reviewed Patient has primary care physician: (P) Yes Home environment has been reviewed: (P) From [facility] Prior level of function:: (P) assistance from ALF, ambulatory at baseline Prior/Current Home Services: (P) Current home services Social Drivers of Health Review: (P) SDOH reviewed needs interventions Readmission risk has been reviewed: (P) Yes Transition of care needs: (P) transition of care needs identified, TOC will continue to follow

## 2023-07-15 NOTE — Procedures (Signed)
Patient Name: Tanya Gray  MRN: [id number]  Epilepsy Attending: Charlsie Quest  Referring Physician/Provider: Morene Crocker, MD  Date: 07/15/2023 Duration: 22.38 mins  Patient history: 78yo F with ams. EEG to evaluate for seizure  Level of alertness: Awake  AEDs during EEG study: None  Technical aspects: This EEG study was done with scalp electrodes positioned according to the 10-20 International system of electrode placement. Electrical activity was reviewed with band pass filter of 1-70Hz , sensitivity of 7 uV/mm, display speed of 43mm/sec with a 60Hz  notched filter applied as appropriate. EEG data were recorded continuously and digitally stored.  Video monitoring was available and reviewed as appropriate.  Description: The posterior dominant rhythm consists of 7.5 Hz activity of moderate voltage (25-35 uV) seen predominantly in posterior head regions, symmetric and reactive to eye opening and eye closing. EEG showed continuous generalized predominantly 5 to 7 Hz theta slowing admixed with intermittent 2-3Hz  delta slowing. Hyperventilation and photic stimulation were not performed.     ABNORMALITY - Continuous slow, generalized  IMPRESSION: This study is suggestive of mild diffuse encephalopathy. No seizures or epileptiform discharges were seen throughout the recording.  Tanya Gray

## 2023-07-15 NOTE — Evaluation (Signed)
Clinical/Bedside Swallow Evaluation Patient Details  Name: Tanya Gray MRN: [id number] Date of Birth: 02-19-1946  Today's Date: 07/15/2023 Time: SLP Start Time (ACUTE ONLY): 1000 SLP Stop Time (ACUTE ONLY): 1018 SLP Time Calculation (min) (ACUTE ONLY): 18 min  Past Medical History:  Past Medical History:  Diagnosis Date   AORTIC INSUFFICIENCY 08/03/2009   Cardiomegaly 01/29/2007   CHF (congestive heart failure) (HCC)    Coronary artery disease    Diverticulosis of colon (without mention of hemorrhage) 08/08/2006   ESOPHAGEAL STRICTURE 02/19/2008   Heartburn 02/19/2008   HYPERLIPIDEMIA 02/18/2008   HYPERTENSION, BENIGN SYSTEMIC 08/08/2006   MYALGIA 01/29/2007   OSTEOARTHRITIS 12/21/2008   PEPTIC ULCER DISEASE 12/21/2008   RHINITIS, ALLERGIC 08/08/2006   Unspecified asthma(493.90) 08/08/2006   Past Surgical History:  Past Surgical History:  Procedure Laterality Date   ABDOMINAL HYSTERECTOMY     CARDIAC CATHETERIZATION     ENDOTRACHEAL INTUBATION EMERGENT     ESOPHAGOGASTRODUODENOSCOPY     LEFT HEART CATH AND CORONARY ANGIOGRAPHY N/A 02/23/2020   Procedure: LEFT HEART CATH AND CORONARY ANGIOGRAPHY;  Surgeon: Elder Negus, MD;  Location: [facility];  Service: Cardiovascular;  Laterality: N/A;   HPI:  Pt is a 78 yo admitted 2/2 from SNF with AMS, fever, cough. Flu A+. CXR and CT Head without acute findings. MRI pending. PMH includes: dementia (very talkative at baseline, feeds self, ambulatory), heartburn, esophageal stricture, HTN, PUD, CHF    Assessment / Plan / Recommendation  Clinical Impression  Pt's mentation appears to be impacting PO intake, with pt not consistently accepting POs. When she does, she has prolonged oral transit but adequate oral clearance. She has a baseline cough that is not noted again until after trials were stopped. Suspect that intake may be limited, but would start with full liquid diet to be able to offer at least small amounts of POs as pt is  accepting. Will continue to follow, hopeful for diet advancement if acceptance and mentation improve.  SLP Visit Diagnosis: Dysphagia, unspecified (R13.10)    Aspiration Risk  Mild aspiration risk;Risk for inadequate nutrition/hydration    Diet Recommendation Thin liquid;Other (Comment) (full liquid diet)    Liquid Administration via: Cup;Straw Medication Administration: Crushed with puree Supervision: Staff to assist with self feeding;Full supervision/cueing for compensatory strategies Compensations: Minimize environmental distractions;Slow rate;Small sips/bites Postural Changes: Seated upright at 90 degrees    Other  Recommendations Oral Care Recommendations: Oral care BID    Recommendations for follow up therapy are one component of a multi-disciplinary discharge planning process, led by the attending physician.  Recommendations may be updated based on patient status, additional functional criteria and insurance authorization.  Follow up Recommendations Skilled nursing-short term rehab (<3 hours/day)      Assistance Recommended at Discharge    Functional Status Assessment Patient has had a recent decline in their functional status and demonstrates the ability to make significant improvements in function in a reasonable and predictable amount of time.  Frequency and Duration min 2x/week  2 weeks       Prognosis Prognosis for improved oropharyngeal function: Good Barriers to Reach Goals: Cognitive deficits      Swallow Study   General HPI: Pt is a 78 yo admitted 2/2 from SNF with AMS, fever, cough. Flu A+. CXR and CT Head without acute findings. MRI pending. PMH includes: dementia (very talkative at baseline, feeds self, ambulatory), heartburn, esophageal stricture, HTN, PUD, CHF Type of Study: Bedside Swallow Evaluation Previous Swallow Assessment: none in chart  Diet Prior to this Study: NPO Temperature Spikes Noted: Yes (102.8) Respiratory Status: Room air History of  Recent Intubation: No Behavior/Cognition: Alert;Cooperative;Confused;Requires cueing Oral Cavity Assessment: Dry Oral Care Completed by SLP: No Oral Cavity - Dentition: Edentulous Self-Feeding Abilities: Total assist Patient Positioning: Upright in bed Baseline Vocal Quality: Low vocal intensity Volitional Cough: Cognitively unable to elicit Volitional Swallow: Able to elicit    Oral/Motor/Sensory Function Overall Oral Motor/Sensory Function:  (not consistently following commands, no overt focal deficits)   Ice Chips Ice chips: Not tested   Thin Liquid Thin Liquid: Within functional limits Presentation: Straw    Nectar Thick Nectar Thick Liquid: Not tested   Honey Thick Honey Thick Liquid: Not tested   Puree Puree: Impaired Presentation: Spoon Oral Phase Functional Implications: Prolonged oral transit   Solid     Solid: Not tested (would not accept)      Mahala Menghini., M.A. CCC-SLP Acute Rehabilitation Services Office [phone]  Secure chat preferred  07/15/2023,10:58 AM

## 2023-07-15 NOTE — Progress Notes (Signed)
2D echo attempted. Patient refused exam. Patient refused to let me get to her chest for exam. Patient would not move her arms from her chest and stated "You will have to find another way."

## 2023-07-15 NOTE — Care Management Obs Status (Cosign Needed)
MEDICARE OBSERVATION STATUS NOTIFICATION   Patient Details  Name: Tanya Gray MRN: [id number] Date of Birth: June 19, 1945   Medicare Observation Status Notification Given:  Yes    Janae Bridgeman, RN 07/15/2023, 10:55 AM

## 2023-07-15 NOTE — Evaluation (Signed)
Physical Therapy Evaluation Patient Details Name: Tanya Gray MRN: [id number] DOB: 11-Apr-1946 Today's Date: 07/15/2023  History of Present Illness  78 y.o. female who presented 2/2 with an acute change in mental status yesterday admitted for acute encephalopathy.  PMHx:  HFpEF, CAD, HTN, PUD, and Dementia with behavioral disturbance.  Clinical Impression  Pt admitted with above diagnosis. Limited mainly by impaired cognition, difficulty following directions. Required mod assist to rise to EOB and stand at bedside with small pivot along bed using RW for support. Bears weight through BIL LEs but has impaired balance, leaning posteriorly. Unable to safely progress gait today. Limited history, from ALF per chart. Likely needs post acute rehab before returning to ALF but will continue to follow and progress as tolerated. Patient will benefit from continued inpatient follow up therapy, <3 hours/day.  Pt currently with functional limitations due to the deficits listed below (see PT Problem List). Pt will benefit from acute skilled PT to increase their independence and safety with mobility to allow discharge.           If plan is discharge home, recommend the following: A lot of help with walking and/or transfers;A lot of help with bathing/dressing/bathroom;Assistance with cooking/housework;Direct supervision/assist for medications management;Direct supervision/assist for financial management;Assist for transportation;Supervision due to cognitive status   Can travel by private vehicle   No (Likely soon)    Equipment Recommendations None recommended by PT (TBD next venue)  Recommendations for Other Services       Functional Status Assessment Patient has had a recent decline in their functional status and demonstrates the ability to make significant improvements in function in a reasonable and predictable amount of time.     Precautions / Restrictions Precautions Precautions:  Fall Restrictions Weight Bearing Restrictions Per Provider Order: No      Mobility  Bed Mobility Overal bed mobility: Needs Assistance Bed Mobility: Supine to Sit, Sit to Supine     Supine to sit: Mod assist Sit to supine: Max assist   General bed mobility comments: Mod assist to facilitate LEs bout of bed and support trunk, cues for technique. Max assist for LEs and trunk back into bed. Poor understanding of task, fairly rigid    Transfers Overall transfer level: Needs assistance Equipment used: Rolling walker (2 wheels) Transfers: Sit to/from Stand Sit to Stand: Mod assist           General transfer comment: Mod assist for boost and balance with RW for support. Cues for hand placement. intermittent freezing. Able to pivot hips to Rt with mod assist along bed.    Ambulation/Gait               General Gait Details: Unable at this time.  Stairs            Wheelchair Mobility     Tilt Bed    Modified Rankin (Stroke Patients Only) Modified Rankin (Stroke Patients Only) Pre-Morbid Rankin Score: Slight disability Modified Rankin: Severe disability     Balance Overall balance assessment: Needs assistance Sitting-balance support: No upper extremity supported, Feet supported Sitting balance-Leahy Scale: Fair Sitting balance - Comments: CGA   Standing balance support: Bilateral upper extremity supported, Reliant on assistive device for balance Standing balance-Leahy Scale: Poor                               Pertinent Vitals/Pain Pain Assessment Pain Assessment: PAINAD Breathing: normal Negative Vocalization: none Facial Expression:  smiling or inexpressive Body Language: relaxed Consolability: no need to console PAINAD Score: 0    Home Living Family/patient expects to be discharged to:: Assisted living                   Additional Comments: Unable to provide hx at this time due to impaired cognition. Chart reveals pt from  ALF.    Prior Function Prior Level of Function : Patient poor historian/Family not available             Mobility Comments: unable to state ADLs Comments: Unable to state but from ALF.     Extremity/Trunk Assessment   Upper Extremity Assessment Upper Extremity Assessment: Defer to OT evaluation    Lower Extremity Assessment Lower Extremity Assessment: Difficult to assess due to impaired cognition;Generalized weakness (Able to bear weight bil)       Communication   Communication Communication: Difficulty following commands/understanding Following commands: Follows one step commands inconsistently;Follows one step commands with increased time Cueing Techniques: Verbal cues;Gestural cues;Tactile cues;Visual cues  Cognition Arousal: Alert Behavior During Therapy: Flat affect Overall Cognitive Status: No family/caregiver present to determine baseline cognitive functioning                                 General Comments: Answers less than 10% of questions. Speaks in tangents at times. Aware of birthdate.        General Comments General comments (skin integrity, edema, etc.): Unable to track finger but will direct gaze Lt and Rt with delay, follows me around the bed with her eyes but does not sustain gaze in one direction for long. Unable to state how many fingers are being held up. Blinks to threat bil. No clonus with ankle jerk. Patellar reflex normal on Lt, diminished on Rt.    Exercises     Assessment/Plan    PT Assessment Patient needs continued PT services  PT Problem List Decreased strength;Decreased activity tolerance;Decreased balance;Decreased mobility;Decreased coordination;Decreased cognition;Decreased safety awareness;Decreased knowledge of use of DME;Decreased knowledge of precautions       PT Treatment Interventions DME instruction;Gait training;Functional mobility training;Therapeutic activities;Therapeutic exercise;Balance  training;Neuromuscular re-education;Cognitive remediation;Patient/family education;Wheelchair mobility training    PT Goals (Current goals can be found in the Care Plan section)  Acute Rehab PT Goals Patient Stated Goal: none stated PT Goal Formulation: Patient unable to participate in goal setting Time For Goal Achievement: 07/29/23 Potential to Achieve Goals: Fair    Frequency Min 1X/week     Co-evaluation               AM-PAC PT "6 Clicks" Mobility  Outcome Measure Help needed turning from your back to your side while in a flat bed without using bedrails?: A Lot Help needed moving from lying on your back to sitting on the side of a flat bed without using bedrails?: A Lot Help needed moving to and from a bed to a chair (including a wheelchair)?: A Lot Help needed standing up from a chair using your arms (e.g., wheelchair or bedside chair)?: A Lot Help needed to walk in hospital room?: Total Help needed climbing 3-5 steps with a railing? : Total 6 Click Score: 10    End of Session Equipment Utilized During Treatment: Gait belt Activity Tolerance: Patient tolerated treatment well Patient left: in bed;with call bell/phone within reach;with bed alarm set   PT Visit Diagnosis: Unsteadiness on feet (R26.81);Muscle weakness (generalized) (M62.81);Difficulty in walking,  not elsewhere classified (R26.2);Other symptoms and signs involving the nervous system (R29.898)    Time: 4098-1191 PT Time Calculation (min) (ACUTE ONLY): 19 min   Charges:   PT Evaluation $PT Eval Moderate Complexity: 1 Mod   PT General Charges $$ ACUTE PT VISIT: 1 Visit         Kathlyn Sacramento, PT, DPT [facility]  Rehabilitation Services Physical Therapist Office: [phone] Website: [email or web address]   Berton Mount 07/15/2023, 5:21 PM

## 2023-07-15 NOTE — Progress Notes (Signed)
 EEG complete - results pending

## 2023-07-15 NOTE — Progress Notes (Signed)
HD#0 SUBJECTIVE:  Patient Summary: Tanya Gray is a 78 y.o. with a HFpEF, CAD, HTN, PUD, and Dementia with behavioral disturbance who presented with an acute change in mental status yesterday admitted for acute encephalopathy.   Overnight Events: Was not able to go down for MRI. EEG this AM.   Interim History:  Patient is awake now, alert to self but perseverated on "[birth year]" when asked any other questions besides her DOB.   OBJECTIVE:  Vital Signs: Vitals:   07/15/23 0156 07/15/23 0537 07/15/23 0747 07/15/23 0831  BP:  (!) 145/80 121/75   Pulse:  86 89   Resp:  18 16   Temp:  (!) 101.4 F (38.6 C) 99.8 F (37.7 C)   TempSrc:  Oral Oral   SpO2: 97% 95% 93% 97%   Supplemental O2: Room Air SpO2: 97 %  There were no vitals filed for this visit.  No intake or output data in the 24 hours ending 07/15/23 0910 Net IO Since Admission: No IO data has been entered for this period [07/15/23 0910]  Physical Exam: Physical Exam Constitutional:      General: She is not in acute distress.    Appearance: She is ill-appearing. She is not toxic-appearing.  HENT:     Mouth/Throat:     Mouth: Mucous membranes are dry.  Eyes:     Conjunctiva/sclera: Conjunctivae normal.  Cardiovascular:     Rate and Rhythm: Normal rate and regular rhythm.     Heart sounds: Murmur heard.     No gallop.  Pulmonary:     Effort: No respiratory distress.     Breath sounds: No wheezing, rhonchi or rales.  Abdominal:     General: Abdomen is flat. There is no distension.     Palpations: Abdomen is soft.     Tenderness: There is no abdominal tenderness. There is no guarding.     Hernia: No hernia is present.  Musculoskeletal:     Cervical back: No rigidity or tenderness.     Right lower leg: No edema.     Left lower leg: No edema.  Lymphadenopathy:     Cervical: No cervical adenopathy.  Skin:    General: Skin is warm and dry.     Capillary Refill: Capillary refill takes less than 2 seconds.      Findings: No bruising.  Neurological:     Mental Status: She is disoriented.     Deep Tendon Reflexes: Babinski sign absent on the right side. Babinski sign absent on the left side.     Comments: Negative Brudzinski    Patient Lines/Drains/Airways Status     Active Line/Drains/Airways     Name Placement date Placement time Site Days   Peripheral IV 07/14/23 20 G Distal;Left;Posterior Forearm 07/14/23  --  Forearm  1             ASSESSMENT/PLAN:  Assessment: Principal Problem:   Acute encephalopathy Active Problems:   Influenza A   Plan:  Tanya Gray is a 78 y.o. with a PMH of HLD, HTN, Aortic valve disorder, Dementia, coming in with abrupt onset AMS admitted for acute encephalopathy.    Acute Encephalopathy  Influenza A Had fevers overnight. Has influenza, no neck rigidity and negative Brudzinski, no babinski sign. She is perseverating today when asked questions. She is able to tell me her name but then keeps repeating her birthday "[birth year]". Work up unremarkable so far. EEG this AM. Called MRI as she was unable  to get it last night due to other code strokes. She is out of window. Given her presentation she most likely had a stroke. B12 and TSH WNL.   -MRI -EEG -Echo completed, read pending  -SLP eval  -delirum precautions  -blood cultures NGTD  -continue holding centrally acting meds -telemetry  -SCDs for VTE ppx for now -cw oseltamivir   Urinary Incontinence  Had multiple occurrences of urinary incontinence overnight. No retention on bladder scan ( this AM). Negative for UTI. Less likely to be a lesion on the spine. No fecal incontinence either. Will CTM  -Pure wick  -KUB to assess for constipation   -Echo pending   Hyponatremia  Elevated LFTs Resolved this AM after her bolus this morning. Likely due to dehydration. Will dc sodium studies.   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 Does not have rhabdo.    HFpEF  Last echo on  record here was 12/2019 showed an EF of 65-70% and grade II diastolic dysfunction. Dry on exam but has a murmur and calcified aortic valve. Will do an echo. Not on furosemide at home. Holding Metoprolol 25 mg daily and nitroglycerine.  -Echo pending    Dementia with behavioral disturbances At baseline she is interactive and recognizes people in the facility. She is on Trazodone 50mg  at bedtime, Meltonin 3mg  nightly, Lorazepam 0.5 mg PRN for showers, Olanzapine 2.5mg  at bedtime (should hold this in dementia unless agitated due to increased risk of death), Buspirone 7.5mg  BID. Will continue to hold these for now.   HLD  Hold pravastatin 80mg  daily, ASA 81mg  daily    HTN  Benazepril 40mg  daily. Will hold as she is NPO. Will allow for permissive HTN until MRI is done.    Best Practice: Diet: NPO pending SLP eval IVF: Fluids: 1L LR VTE: Place and maintain sequential compression device Start: 07/14/23 1858 Code: Full  Signature: [facility]  Internal Medicine Resident, PGY-1 [facility]  Pager: [phone] 9:10 AM, 07/15/2023   Please contact the on call pager after 5 pm and on weekends at [phone].

## 2023-07-15 NOTE — Plan of Care (Signed)
Care plan implemented

## 2023-07-15 NOTE — Progress Notes (Signed)
Patient refused afternoon vitals.

## 2023-07-16 ENCOUNTER — Inpatient Hospital Stay (HOSPITAL_COMMUNITY): Payer: 59

## 2023-07-16 DIAGNOSIS — Z8659 Personal history of other mental and behavioral disorders: Secondary | ICD-10-CM | POA: Diagnosis not present

## 2023-07-16 DIAGNOSIS — J101 Influenza due to other identified influenza virus with other respiratory manifestations: Secondary | ICD-10-CM | POA: Diagnosis not present

## 2023-07-16 DIAGNOSIS — R011 Cardiac murmur, unspecified: Secondary | ICD-10-CM | POA: Diagnosis not present

## 2023-07-16 DIAGNOSIS — G9340 Encephalopathy, unspecified: Secondary | ICD-10-CM | POA: Diagnosis not present

## 2023-07-16 DIAGNOSIS — I159 Secondary hypertension, unspecified: Secondary | ICD-10-CM | POA: Diagnosis not present

## 2023-07-16 LAB — CBC WITH DIFFERENTIAL/PLATELET
Abs Immature Granulocytes: 0.01 10*3/uL (ref 0.00–0.07)
Basophils Absolute: 0 10*3/uL (ref 0.0–0.1)
Basophils Relative: 1 %
Eosinophils Absolute: 0 10*3/uL (ref 0.0–0.5)
Eosinophils Relative: 0 %
HCT: 41.1 % (ref 36.0–46.0)
Hemoglobin: 13.2 g/dL (ref 12.0–15.0)
Immature Granulocytes: 0 %
Lymphocytes Relative: 39 %
Lymphs Abs: 1 10*3/uL (ref 0.7–4.0)
MCH: 30.3 pg (ref 26.0–34.0)
MCHC: 32.1 g/dL (ref 30.0–36.0)
MCV: 94.5 fL (ref 80.0–100.0)
Monocytes Absolute: 0.4 10*3/uL (ref 0.1–1.0)
Monocytes Relative: 14 %
Neutro Abs: 1.2 10*3/uL — ABNORMAL LOW (ref 1.7–7.7)
Neutrophils Relative %: 46 %
Platelets: 175 10*3/uL (ref 150–400)
RBC: 4.35 MIL/uL (ref 3.87–5.11)
RDW: 13.7 % (ref 11.5–15.5)
WBC: 2.6 10*3/uL — ABNORMAL LOW (ref 4.0–10.5)
nRBC: 0 % (ref 0.0–0.2)

## 2023-07-16 LAB — RESPIRATORY PANEL BY PCR

## 2023-07-16 LAB — COMPREHENSIVE METABOLIC PANEL
ALT: 21 U/L (ref 0–44)
AST: 50 U/L — ABNORMAL HIGH (ref 15–41)
Albumin: 3.2 g/dL — ABNORMAL LOW (ref 3.5–5.0)
Alkaline Phosphatase: 51 U/L (ref 38–126)
Anion gap: 10 (ref 5–15)
BUN: 13 mg/dL (ref 8–23)
CO2: 25 mmol/L (ref 22–32)
Calcium: 8.3 mg/dL — ABNORMAL LOW (ref 8.9–10.3)
Chloride: 105 mmol/L (ref 98–111)
Creatinine, Ser: 1.02 mg/dL — ABNORMAL HIGH (ref 0.44–1.00)
GFR, Estimated: 57 mL/min — ABNORMAL LOW (ref >60)
Glucose, Bld: 83 mg/dL (ref 70–99)
Potassium: 4.1 mmol/L (ref 3.5–5.1)
Sodium: 140 mmol/L (ref 135–145)
Total Bilirubin: 0.9 mg/dL (ref 0.0–1.2)
Total Protein: 5.8 g/dL — ABNORMAL LOW (ref 6.5–8.1)

## 2023-07-16 LAB — ECHOCARDIOGRAM COMPLETE
Height: 63 in
S' Lateral: 2.7 cm
Weight: 2313.95 [oz_av]

## 2023-07-16 LAB — MAGNESIUM: Magnesium: 1.8 mg/dL (ref 1.7–2.4)

## 2023-07-16 MED ORDER — PRAVASTATIN SODIUM 40 MG PO TABS
80.0000 mg | ORAL_TABLET | Freq: Every day | ORAL | Status: DC
  Administered 2023-07-16 – 2023-07-19 (×4): 80 mg via ORAL
  Filled 2023-07-16 (×4): qty 2

## 2023-07-16 MED ORDER — ACETAMINOPHEN 500 MG PO TABS: 1000.0000 mg | ORAL_TABLET | Freq: Four times a day (QID) | ORAL | Status: DC | PRN

## 2023-07-16 MED ORDER — RIVAROXABAN 10 MG PO TABS
10.0000 mg | ORAL_TABLET | Freq: Every day | ORAL | Status: DC
  Administered 2023-07-16 – 2023-07-19 (×4): 10 mg via ORAL
  Filled 2023-07-16 (×4): qty 1

## 2023-07-16 MED ORDER — ACETAMINOPHEN 500 MG PO TABS
1000.0000 mg | ORAL_TABLET | Freq: Three times a day (TID) | ORAL | Status: DC
  Administered 2023-07-16 – 2023-07-19 (×6): 1000 mg via ORAL
  Filled 2023-07-16 (×8): qty 2

## 2023-07-16 MED ORDER — ASPIRIN 81 MG PO TBEC
81.0000 mg | DELAYED_RELEASE_TABLET | Freq: Every day | ORAL | Status: DC
  Administered 2023-07-16 – 2023-07-19 (×4): 81 mg via ORAL
  Filled 2023-07-16 (×4): qty 1

## 2023-07-16 MED ORDER — LACTATED RINGERS IV BOLUS
500.0000 mL | Freq: Once | INTRAVENOUS | Status: AC
  Administered 2023-07-16: 500 mL via INTRAVENOUS

## 2023-07-16 MED ORDER — ACETAMINOPHEN 650 MG RE SUPP
650.0000 mg | Freq: Three times a day (TID) | RECTAL | Status: DC
  Filled 2023-07-16: qty 1

## 2023-07-16 MED ORDER — MELATONIN 3 MG PO TABS
3.0000 mg | ORAL_TABLET | Freq: Every day | ORAL | Status: DC
Start: 2023-07-16 — End: 2023-07-19
  Administered 2023-07-17 – 2023-07-18 (×2): 3 mg via ORAL
  Filled 2023-07-16 (×3): qty 1

## 2023-07-16 MED ORDER — BENAZEPRIL HCL 20 MG PO TABS
40.0000 mg | ORAL_TABLET | Freq: Every day | ORAL | Status: DC
  Administered 2023-07-16 – 2023-07-19 (×4): 40 mg via ORAL
  Filled 2023-07-16 (×4): qty 2

## 2023-07-16 MED ORDER — ACETAMINOPHEN 650 MG RE SUPP: 650.0000 mg | Freq: Four times a day (QID) | RECTAL | Status: DC | PRN

## 2023-07-16 NOTE — Progress Notes (Signed)
  Echocardiogram 2D Echocardiogram has been performed.  Tanya Gray 07/16/2023, 8:44 AM

## 2023-07-16 NOTE — Evaluation (Signed)
 Occupational Therapy Evaluation Patient Details Name: Tanya Gray MRN: [id number] DOB: November 20, 1945 Today's Date: 07/16/2023   History of Present Illness 78 y.o. female who presented 2/2 with an acute change in mental status yesterday admitted for acute encephalopathy.  PMHx:  HFpEF, CAD, HTN, PUD, and Dementia with behavioral disturbance.   Clinical Impression   Pt c/o no pain, limited active participation due to poor cognition, able to follow commands/answer questions <10% of time. Pt not able to provide PLOF or history. Pt requires mod-max A for bed mobility, not able to follow commands to assist during task, took a few minutes to support herself with CGA sitting EOB. Pt able to stand mod A x2 with RW and therapist support to maintain balance, took 1-2 steps forward and backward with max verbal/tactile cues. Pt given meal tray at end of session and was able to take some bites with set up, will likely need assistance to complete feeding. Pt would benefit from postacute rehab <3hrs/day to maximize participation and functional independence, will continue to follow acutely to progress as able.        If plan is discharge home, recommend the following: Two people to help with walking and/or transfers;A lot of help with bathing/dressing/bathroom;Assistance with cooking/housework;Assist for transportation;Help with stairs or ramp for entrance    Functional Status Assessment  Patient has had a recent decline in their functional status and demonstrates the ability to make significant improvements in function in a reasonable and predictable amount of time.  Equipment Recommendations  Other (comment) (defer)    Recommendations for Other Services       Precautions / Restrictions Precautions Precautions: Fall Restrictions Weight Bearing Restrictions Per Provider Order: No      Mobility Bed Mobility Overal bed mobility: Needs Assistance Bed Mobility: Supine to Sit, Sit to Supine      Supine to sit: Max assist, HOB elevated Sit to supine: Max assist   General bed mobility comments: max A for in/OOB, unable to follow commands limiting participation    Transfers Overall transfer level: Needs assistance Equipment used: Rolling walker (2 wheels) Transfers: Sit to/from Stand Sit to Stand: Mod assist, +2 physical assistance           General transfer comment: mod A x2 for STS and maintaining balance standing with RW, able to take very small steps with max cueing.      Balance Overall balance assessment: Needs assistance Sitting-balance support: No upper extremity supported, Feet supported Sitting balance-Leahy Scale: Fair Sitting balance - Comments: mod A at first with posterior lean, eventually CGA for safety with feet on floor Postural control: Posterior lean Standing balance support: Bilateral upper extremity supported, During functional activity, Reliant on assistive device for balance Standing balance-Leahy Scale: Poor Standing balance comment: reliant on RW and therapist support to maintain balance                           ADL either performed or assessed with clinical judgement   ADL Overall ADL's : Needs assistance/impaired Eating/Feeding: Minimal assistance;Bed level   Grooming: Maximal assistance;Bed level   Upper Body Bathing: Maximal assistance;Bed level;Cueing for sequencing   Lower Body Bathing: Total assistance;Bed level   Upper Body Dressing : Maximal assistance;Bed level   Lower Body Dressing: Total assistance;Bed level   Toilet Transfer: Maximal assistance;+2 for physical assistance;Rolling walker (2 wheels);BSC/3in1   Toileting- Clothing Manipulation and Hygiene: Total assistance;Sit to/from stand  General ADL Comments: Pt max-total for ADLs, poor cognition limits participation, unable to follow commands consistently.     Vision         Perception         Praxis         Pertinent Vitals/Pain Pain  Assessment Pain Assessment: No/denies pain     Extremity/Trunk Assessment Upper Extremity Assessment Upper Extremity Assessment: Difficult to assess due to impaired cognition           Communication Communication Communication: Difficulty following commands/understanding Following commands: Follows one step commands inconsistently Cueing Techniques: Gestural cues;Tactile cues   Cognition Arousal: Alert Behavior During Therapy: Flat affect Overall Cognitive Status: No family/caregiver present to determine baseline cognitive functioning                                 General Comments: not able to respond appropriately or answer any orientation questions     General Comments       Exercises     Shoulder Instructions      Home Living Family/patient expects to be discharged to:: Assisted living                                 Additional Comments: unable to provide history      Prior Functioning/Environment Prior Level of Function : Patient poor historian/Family not available             Mobility Comments: unable to reply appropriately ADLs Comments: unable to reply appropriately        OT Problem List: Decreased strength;Decreased range of motion;Decreased activity tolerance;Impaired balance (sitting and/or standing);Decreased cognition;Decreased safety awareness      OT Treatment/Interventions: Self-care/ADL training;Therapeutic exercise;Energy conservation;DME and/or AE instruction;Therapeutic activities    OT Goals(Current goals can be found in the care plan section) Acute Rehab OT Goals Patient Stated Goal: unable to participation in goal setting OT Goal Formulation: Patient unable to participate in goal setting Time For Goal Achievement: 07/30/23 Potential to Achieve Goals: Fair  OT Frequency: Min 1X/week    Co-evaluation              AM-PAC OT 6 Clicks Daily Activity     Outcome Measure Help from another person  eating meals?: A Little Help from another person taking care of personal grooming?: A Lot Help from another person toileting, which includes using toliet, bedpan, or urinal?: A Lot Help from another person bathing (including washing, rinsing, drying)?: A Lot Help from another person to put on and taking off regular upper body clothing?: A Lot Help from another person to put on and taking off regular lower body clothing?: Total 6 Click Score: 12   End of Session Equipment Utilized During Treatment: Gait belt;Rolling walker (2 wheels) Nurse Communication: Mobility status  Activity Tolerance: Patient tolerated treatment well Patient left: in bed;with call bell/phone within reach;with bed alarm set  OT Visit Diagnosis: Unsteadiness on feet (R26.81);Other abnormalities of gait and mobility (R26.89);Muscle weakness (generalized) (M62.81);Other symptoms and signs involving cognitive function                Time: 1413-1445 OT Time Calculation (min): 32 min Charges:  OT General Charges $OT Visit: 1 Visit OT Evaluation $OT Eval Moderate Complexity: 1 Mod OT Treatments $Self Care/Home Management : 8-22 mins  [place], OTR/L   Elouise JONELLE Bott 07/16/2023, 3:31 PM

## 2023-07-16 NOTE — Progress Notes (Signed)
Pt jsut left patients room.  MD tried encouraging patient to take her medications but patient will verbally agree to do it but then just won't.  Will cont to observe.

## 2023-07-16 NOTE — Progress Notes (Signed)
 HD#1 SUBJECTIVE:  Patient Summary: Tanya Gray is a 78 y.o. with a HFpEF, CAD, HTN, PUD, and Dementia with behavioral disturbance who presented with an acute change in mental status yesterday admitted for acute encephalopathy.   Overnight Events: NAEO  Interim History:  She is feeling well and not in pain.   OBJECTIVE:  Vital Signs: Vitals:   07/16/23 0109 07/16/23 0356 07/16/23 0604 07/16/23 0802  BP: (!) 187/100  (!) 178/100 (!) 159/86  Pulse: 91  99 90  Resp: 18  18 16   Temp: 99.5 F (37.5 C)  99 F (37.2 C) 99.8 F (37.7 C)  TempSrc: Oral  Oral Oral  SpO2: 94%  100% 99%  Weight:  65.6 kg    Height:       Supplemental O2: Room Air SpO2: 99 %  Filed Weights   07/16/23 0356  Weight: 65.6 kg     Intake/Output Summary (Last 24 hours) at 07/16/2023 1014 Last data filed at 07/16/2023 0840 Gross per 24 hour  Intake --  Output 200 ml  Net -200 ml   Net IO Since Admission: -200 mL [07/16/23 1014]  Physical Exam: Physical Exam Constitutional:      General: She is not in acute distress.    Appearance: She is ill-appearing. She is not toxic-appearing.  HENT:     Mouth/Throat:     Mouth: Mucous membranes are dry.  Eyes:     Conjunctiva/sclera: Conjunctivae normal.  Cardiovascular:     Rate and Rhythm: Normal rate and regular rhythm.     Heart sounds: Murmur heard.     No gallop.  Pulmonary:     Effort: No respiratory distress.     Breath sounds: No wheezing, rhonchi or rales.  Abdominal:     General: Abdomen is flat. There is no distension.     Palpations: Abdomen is soft.     Tenderness: There is no abdominal tenderness. There is no guarding.     Hernia: No hernia is present.  Musculoskeletal:     Cervical back: No rigidity or tenderness.     Right lower leg: No edema.     Left lower leg: No edema.  Lymphadenopathy:     Cervical: No cervical adenopathy.  Skin:    General: Skin is warm and dry.     Capillary Refill: Capillary refill takes less  than 2 seconds.     Findings: No bruising.  Neurological:     Mental Status: She is disoriented.    Patient Lines/Drains/Airways Status     Active Line/Drains/Airways     Name Placement date Placement time Site Days   Peripheral IV 07/14/23 20 G Distal;Left;Posterior Forearm 07/14/23  --  Forearm  1             ASSESSMENT/PLAN:  Assessment: Principal Problem:   Acute encephalopathy Active Problems:   Influenza A   Hypoglycemia   Plan:  [name] is a 78 y.o. with a PMH of HLD, HTN, Aortic valve disorder, Dementia, coming in with abrupt onset AMS admitted for acute encephalopathy.    Acute Encephalopathy  Influenza A Dehydration Improved mentation after hydration. LFTs mildly elevated today. CT and MRI not concerning for an acute stroke. EEG with mild diffuse encephalopathy.  She is at her baseline now.   -LR 250mls today again over 2 hours   -Assess volume status then -telemetry  -Xarelto  for VTE  -cw oseltamivir   -Mucinex   -delirum precautions    HFpEF  HTN Echo  with LV EF 60-55% poor quality to assess ventricular wall function. She has severe concentric left ventricular hypertrophy. 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 Not on furosemide at home. Still has wheezing and cough from influenza. Will keep holding Metoprolol  25 mg daily and nitroglycerine. Euvolemic today.  -Restarting Benazepril  40mg  daily since she has no signs of an acute stroke on imaging as is hypertensive. -Monitor K as she was on K supplementation  -Restart Metoprolol  if HR continues to increase   Dementia with behavioral disturbances At baseline she is interactive and recognizes people in the facility but not AAOx3.  -Restart Trazodone  50mg  at bedtime, Meltonin 3mg  nightly and Buspirone  7.5mg  BID  -continue holding Lorazepam  0.5 mg PRN for showers.  -She was on Olanzapine  2.5mg  at bedtime (will hold this as she has dementia and would only use in case of  agitation (violence against staff) due to increased risk of death)  Aortic Aneurysm  Found incidentally due to murmur. Echo showing an ascending aortic aneurysm measuring 4.1cm. Will need follow up in one year.   Urinary Incontinence  On purewick. Most likely her baseline. KUB with moderate stool burden had BM yesterday.  -Pure wick  -KUB to assess for constipation   -Senna PRN   Hemoglobinuria  CK is 541, not in the thousands. Does not have rhabdo. Could be because of the flu.  HLD  Can restart pravastatin  80mg  daily, ASA 81mg  daily    Best Practice: Diet: Full liquid IVF: Fluids: 1L LR VTE: rivaroxaban  (XARELTO ) tablet 10 mg Start: 07/16/23 1100 Place and maintain sequential compression device Start: 07/14/23 1858 Code: Full  Signature: [facility]  Internal Medicine Resident, PGY-1 [facility]  Pager: [phone] 10:14 AM, 07/16/2023   Please contact the on call pager after 5 pm and on weekends at [phone].

## 2023-07-16 NOTE — Progress Notes (Signed)
 Speech Language Pathology Treatment: Dysphagia  Patient Details Name: Tanya Gray MRN: [id number] DOB: 11-Nov-1945 Today's Date: 07/16/2023 Time: 8969-8958 SLP Time Calculation (min) (ACUTE ONLY): 11 min  Assessment / Plan / Recommendation Clinical Impression  Pt continues to be limited in intake by mentation, but today she did accept a few small pieces of solid food. Mastication was initiated promptly, and she appeared to swallow swiftly with no visible oral residue. Baseline, congested cough persists and complicates clinical picture, but even when drinking large, consecutive boluses, there is no immediate coughing that follows and there is no change in frequency of coughing across intake. Recommend adjusting diet to Dys 2 solids, continuing thin liquids, with full supervision in light of cognitive status. Pt benefited from self-feeding today to increase intake, so would encourage staff to assist her with this during meals.   HPI HPI: Pt is a 78 yo admitted 2/2 from SNF with AMS, fever, cough. Flu A+. CXR and CT Head without acute findings. MRI pending. PMH includes: dementia (very talkative at baseline, feeds self, ambulatory), heartburn, esophageal stricture, HTN, PUD, CHF      SLP Plan  Continue with current plan of care      Recommendations for follow up therapy are one component of a multi-disciplinary discharge planning process, led by the attending physician.  Recommendations may be updated based on patient status, additional functional criteria and insurance authorization.    Recommendations  Diet recommendations: Dysphagia 2 (fine chop);Thin liquid Liquids provided via: Cup;Straw Medication Administration: Crushed with puree Supervision: Staff to assist with self feeding;Full supervision/cueing for compensatory strategies Compensations: Minimize environmental distractions;Slow rate;Small sips/bites Postural Changes and/or Swallow Maneuvers: Seated upright 90 degrees;Upright  30-60 min after meal                  Oral care BID   Frequent or constant Supervision/Assistance Dysphagia, unspecified (R13.10)     Continue with current plan of care     Leita SAILOR., M.A. CCC-SLP Acute Rehabilitation Services Office [phone]  Secure chat preferred   07/16/2023, 10:59 AM

## 2023-07-16 NOTE — Progress Notes (Incomplete)
During med pass, I informed patient of what meds she has due, what they are for and she agreed to take them.  I crushed them and mixed in choc milkshake at her request and now she refuses to take her meds.  Educated several tx's.  Charge nurse aware.

## 2023-07-16 NOTE — NC FL2 (Addendum)
 [place]  MEDICAID FL2 LEVEL OF CARE FORM     IDENTIFICATION  Patient Name: Tanya Gray Birthdate: 08-21-1945 Sex: female Admission Date (Current Location): 07/14/2023  [facility] and [place] Number:  Producer, Television/film/video and Address:  [facility] [place], [address] [place], [place] [address]      Provider Number: [id number]  Attending Physician Name and Address:  Eben Reyes BROCKS, MD  Relative Name and Phone Number:  Rosemarie Media, cousin - [phone]    Current Level of Care: Hospital Recommended Level of Care: Skilled Nursing Facility Prior Approval Number:    Date Approved/Denied:   PASRR Number: [id number]  Discharge Plan: SNF    Current Diagnoses: Patient Active Problem List   Diagnosis Date Noted   Hypoglycemia 07/15/2023   Acute encephalopathy 07/14/2023   Influenza A 07/14/2023   Chest pain 02/23/2020   Abnormal stress test 02/23/2020   History of colonic polyps 03/03/2013   Aortic valve disorder 08/03/2009   PEPTIC ULCER DISEASE 12/21/2008   ESOPHAGEAL STRICTURE 02/19/2008   Dyslipidemia 02/18/2008   CARDIOMEGALY 01/29/2007   HYPERTENSION, BENIGN SYSTEMIC 08/08/2006   RHINITIS, ALLERGIC 08/08/2006   Unspecified asthma(493.90) 08/08/2006   Diverticulosis of colon (without mention of hemorrhage) 08/08/2006   Osteoarthritis 08/08/2006    Orientation RESPIRATION BLADDER Height & Weight     Self  Normal Incontinent Weight: 65.6 kg Height:  5' 3 (160 cm)  BEHAVIORAL SYMPTOMS/MOOD NEUROLOGICAL BOWEL NUTRITION STATUS      Continent Diet (See Discharge Summary)  AMBULATORY STATUS COMMUNICATION OF NEEDS Skin   Extensive Assist Verbally Normal                       Personal Care Assistance Level of Assistance  Bathing, Feeding, Dressing Bathing Assistance: Limited assistance Feeding assistance: Limited assistance Dressing Assistance: Maximum assistance     Functional Limitations Info  Sight, Hearing,  Speech Sight Info: Impaired Hearing Info: Adequate Speech Info: Adequate    SPECIAL CARE FACTORS FREQUENCY  PT (By licensed PT), OT (By licensed OT)     PT Frequency: 5 x per week OT Frequency: 5 x per week            Contractures Contractures Info: Not present    Additional Factors Info  Code Status, Allergies, Psychotropic, Isolation Precautions Code Status Info: Full code Allergies Info: NKDA Psychotropic Info: Buspar , Trazodone    Isolation Precautions Info: Droplet     Current Medications (07/16/2023):  This is the current hospital active medication list Current Facility-Administered Medications  Medication Dose Route Frequency Provider Last Rate Last Admin   acetaminophen  (TYLENOL ) tablet 1,000 mg  1,000 mg Oral Q8H Alexander-Savino, [place], MD       Or   acetaminophen  (TYLENOL ) suppository 650 mg  650 mg Rectal Q8H Alexander-Savino, [place], MD       albuterol  (PROVENTIL ) (2.5 MG/3ML) 0.083% nebulizer solution 2.5 mg  2.5 mg Nebulization Q6H PRN Eben Reyes BROCKS, MD       benazepril  (LOTENSIN ) tablet 40 mg  40 mg Oral Daily Gomez-Caraballo, Maria, MD       busPIRone  (BUSPAR ) tablet 7.5 mg  7.5 mg Oral BID Gomez-Caraballo, Maria, MD   7.5 mg at 07/15/23 2232   guaiFENesin  (MUCINEX ) 12 hr tablet 600 mg  600 mg Oral BID Gomez-Caraballo, Maria, MD   600 mg at 07/15/23 2232   lactated ringers  bolus 500 mL  500 mL Intravenous Once Gomez-Caraballo, Maria, MD       oseltamivir  (  TAMIFLU ) capsule 30 mg  30 mg Oral BID Reome, Earle J, RPH   30 mg at 07/15/23 2232   senna (SENOKOT) tablet 8.6 mg  1 tablet Oral Daily PRN Zheng, Michael, DO       traZODone  (DESYREL ) tablet 50 mg  50 mg Oral QHS Gomez-Caraballo, Maria, MD   50 mg at 07/15/23 2233     Discharge Medications: Please see discharge summary for a list of discharge medications.  Relevant Imaging Results:  Relevant Lab Results:   Additional Information SS# [id number]  Rosaline JONELLE Joe, RN

## 2023-07-16 NOTE — Plan of Care (Signed)
   Problem: Clinical Measurements: Goal: Will remain free from infection Outcome: Progressing Goal: Diagnostic test results will improve Outcome: Progressing Goal: Respiratory complications will improve Outcome: Progressing Goal: Cardiovascular complication will be avoided Outcome: Progressing   Problem: Activity: Goal: Risk for activity intolerance will decrease Outcome: Progressing

## 2023-07-16 NOTE — Progress Notes (Signed)
Name: Tanya Gray DOB: [birth year]  Please be advised that the above-named patient will require a short-term nursing home stay -- anticipated 30 days or less for rehabilitation and strengthening. The plan is for return home.

## 2023-07-16 NOTE — TOC Progression Note (Signed)
 Transition of Care [facility]) - Progression Note    Patient Details  Name: NIKIA LEVELS MRN: [id number] Date of Birth: [birth year]  [facility] CM/SW Contact  Colisha Redler A Kaylana Fenstermacher, [place] Phone Number: 07/16/2023, 4:24 PM  Clinical Narrative:     CSW contacted pt's relative, [name] and spoke with her and another family member regarding pt's bed offers, provided with Medicare.gov ratings. They requested CSW also send out referrals to [facility] and [facility] in [place] as pt's family are closer to that area.  CSW reached to [facility] and  sent referral to facility.  CSW to follow up with family tomorrow regarding decision. CSW to reach out to [facility] with decision for bed offer regarding pt.    TOC will continue to follow.        Expected Discharge Plan and Services                                               Social Determinants of Health (SDOH) Interventions SDOH Screenings   Food Insecurity: Patient Unable To Answer (07/15/2023)  Housing: Patient Unable To Answer (07/15/2023)  Transportation Needs: Patient Unable To Answer (07/15/2023)  Utilities: Patient Unable To Answer (07/15/2023)  Social Connections: Unknown (07/15/2023)  Tobacco Use: Medium Risk (09/27/2022)    Readmission Risk Interventions     No data to display

## 2023-07-17 DIAGNOSIS — J101 Influenza due to other identified influenza virus with other respiratory manifestations: Secondary | ICD-10-CM | POA: Diagnosis not present

## 2023-07-17 DIAGNOSIS — G9340 Encephalopathy, unspecified: Secondary | ICD-10-CM | POA: Diagnosis not present

## 2023-07-17 LAB — COMPREHENSIVE METABOLIC PANEL
ALT: 20 U/L (ref 0–44)
AST: 40 U/L (ref 15–41)
Albumin: 3 g/dL — ABNORMAL LOW (ref 3.5–5.0)
Alkaline Phosphatase: 54 U/L (ref 38–126)
Anion gap: 12 (ref 5–15)
BUN: 14 mg/dL (ref 8–23)
CO2: 27 mmol/L (ref 22–32)
Calcium: 8.4 mg/dL — ABNORMAL LOW (ref 8.9–10.3)
Chloride: 101 mmol/L (ref 98–111)
Creatinine, Ser: 1.1 mg/dL — ABNORMAL HIGH (ref 0.44–1.00)
GFR, Estimated: 52 mL/min — ABNORMAL LOW (ref >60)
Glucose, Bld: 87 mg/dL (ref 70–99)
Potassium: 3.4 mmol/L — ABNORMAL LOW (ref 3.5–5.1)
Sodium: 140 mmol/L (ref 135–145)
Total Bilirubin: 0.5 mg/dL (ref 0.0–1.2)
Total Protein: 5.7 g/dL — ABNORMAL LOW (ref 6.5–8.1)

## 2023-07-17 MED ORDER — SENNA 8.6 MG PO TABS: 1.0000 | ORAL_TABLET | Freq: Every day | ORAL | Status: AC | PRN

## 2023-07-17 MED ORDER — OSELTAMIVIR PHOSPHATE 30 MG PO CAPS: 30.0000 mg | ORAL_CAPSULE | Freq: Every day | ORAL | Status: DC

## 2023-07-17 MED ORDER — ACETAMINOPHEN 500 MG PO TABS: 1000.0000 mg | ORAL_TABLET | Freq: Three times a day (TID) | ORAL | Status: AC | PRN

## 2023-07-17 MED ORDER — OLANZAPINE 2.5 MG PO TABS
2.5000 mg | ORAL_TABLET | Freq: Every day | ORAL | Status: DC
  Administered 2023-07-17 – 2023-07-18 (×2): 2.5 mg via ORAL
  Filled 2023-07-17 (×3): qty 1

## 2023-07-17 NOTE — Progress Notes (Addendum)
 HD#2 SUBJECTIVE:  Patient Summary: Tanya Gray is a 78 y.o. with a HFpEF, CAD, HTN, PUD, and Dementia with behavioral disturbance who presented with an acute change in mental status yesterday admitted for acute encephalopathy.   Overnight Events: NAEO  Interim History:  She is feeling well, ate some of her breakfast.   OBJECTIVE:  Vital Signs: Vitals:   07/17/23 0500 07/17/23 0531 07/17/23 0700 07/17/23 0944  BP:  (!) 154/92 (!) 141/87 139/71  Pulse: 94 (!) 45 93 95  Resp: 17 20 16 19   Temp: 99.8 F (37.7 C) 99 F (37.2 C) 99.1 F (37.3 C) 98.2 F (36.8 C)  TempSrc: Oral  Oral Oral  SpO2: 94% 92% 90% 90%  Weight: 63.8 kg     Height:       Supplemental O2: Room Air SpO2: 90 %  Filed Weights   07/16/23 0356 07/17/23 0500  Weight: 65.6 kg 63.8 kg    No intake or output data in the 24 hours ending 07/17/23 1323  Net IO Since Admission: -200 mL [07/17/23 1323]  Physical Exam: Physical Exam Constitutional:      General: She is not in acute distress.    Appearance: She is ill-appearing. She is not toxic-appearing.  HENT:     Mouth/Throat:     Mouth: Mucous membranes are dry.  Eyes:     Conjunctiva/sclera: Conjunctivae normal.  Cardiovascular:     Rate and Rhythm: Normal rate and regular rhythm.     Heart sounds: Murmur heard.     No gallop.  Pulmonary:     Effort: No respiratory distress.     Breath sounds: No wheezing, rhonchi or rales.  Abdominal:     General: Abdomen is flat. There is no distension.     Palpations: Abdomen is soft.     Tenderness: There is no abdominal tenderness. There is no guarding.     Hernia: No hernia is present.  Musculoskeletal:     Cervical back: No rigidity or tenderness.     Right lower leg: No edema.     Left lower leg: No edema.  Lymphadenopathy:     Cervical: No cervical adenopathy.  Skin:    General: Skin is warm and dry.     Capillary Refill: Capillary refill takes less than 2 seconds.     Findings: No  bruising.  Neurological:     Mental Status: She is disoriented.     Comments: Alerta and oriented to self. Not oriented to place. Recognizes her cousin's name.     Patient Lines/Drains/Airways Status     Active Line/Drains/Airways     Name Placement date Placement time Site Days   Peripheral IV 07/14/23 20 G Distal;Left;Posterior Forearm 07/14/23  --  Forearm  1             ASSESSMENT/PLAN:  Assessment: Principal Problem:   Acute encephalopathy Active Problems:   Influenza A   Hypoglycemia   History of dementia   Secondary hypertension   Plan:  Avonda W Dhanani is a 78 y.o. with a PMH of HLD, HTN, Aortic valve disorder, Dementia, coming in with abrupt onset AMS admitted for acute encephalopathy.    Acute Metabolic Encephalopathy  Influenza A Dehydration Improving and was eating more off her plate today. Pending SNF placement. Still not fully at baseline. Will need to encourage more PO intake.   -Encourage PO intake -telemetry  -Xarelto  for VTE  -cw oseltamivir   -Mucinex   -delirum precautions    Chronic  HFpEF  HTN Echo with LV EF 60-55% poor quality to assess ventricular wall function. She has severe concentric left ventricular hypertrophy. 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 Not on furosemide at home. Still has wheezing and cough from influenza. Will keep holding Metoprolol  25 mg daily and nitroglycerine. Euvolemic today.  -cw Benazepril  40mg  daily  -Monitor K as she was on K supplementation  -hold Metoprolol  if HR as HR was low today  Dementia with behavioral disturbances Refused meds yesterday. Will restart Olanzapine .  -cw Trazodone  50mg  at bedtime, Meltonin 3mg  nightly and Buspirone  7.5mg  BID  -continue holding Lorazepam  0.5 mg PRN for showers.  -re-start Olanzapine  2.5mg  at bedtime   Aortic Aneurysm  Found incidentally due to murmur. Echo showing an ascending aortic aneurysm measuring 4.1cm. Will need follow up in one  year.   Urinary Incontinence  On purewick. Has urinary incontinence at baseline per family. -Pure wick  -KUB to assess for constipation   -Senna PRN   HLD  Can restart pravastatin  80mg  daily, ASA 81mg  daily    Best Practice: Diet: Full liquid IVF: Fluids: 1L LR VTE: rivaroxaban  (XARELTO ) tablet 10 mg Start: 07/16/23 1100 Place and maintain sequential compression device Start: 07/14/23 1858 Code: Full  Signature: [facility]  Internal Medicine Resident, PGY-1 [facility]  Pager: [phone] 1:23 PM, 07/17/2023   Please contact the on call pager after 5 pm and on weekends at [phone].

## 2023-07-17 NOTE — Discharge Instructions (Addendum)
 Ms. Chirco was hospitalized because of an acute change in her mentation. Tested positive for influenza A and was dehydrated. A CT and MRI of her brain did not show changes concerning for a stroke, there was no evidence of a seizure, her laboratory tests indicated that she was dehydrated and improved after giving her some fluids. She obtained supportive care and now needs assist and prompting with feeding.  Per family, Ms. Lupu is usually talkative at her baseline, she will be alert to her name and date of birth. She would know where she is at. She recognizes her cousin Bascom but sometimes forgets. Per staff at her ALF she was independent with most of her ADLs.   Please encourage PO intake, ensure she is drinking water and feeding. May need assistance initially as she works with PT to get stronger.   Contact information for cousin:   Weston Bascom (Relative) [phone]   ALF confirmed at admission that patient remains Full code.   Sincerely,  Cheron Mallard, MD  PGY-1

## 2023-07-17 NOTE — Progress Notes (Signed)
 Physical Therapy Treatment Patient Details Name: Tanya Gray MRN: [id number] DOB: [birth year] Today's Date: 07/17/2023   History of Present Illness 78 y.o. female who presented 2/2 with an acute change in mental status yesterday admitted for acute encephalopathy.  PMHx:  HFpEF, CAD, HTN, PUD, and Dementia with behavioral disturbance.    PT Comments  Patient progressing with in room ambulation this session.  Still very stiff and with posterior bias needing assist to prevent LOB.  She was able to turn with max A and make it back to recliner.  She will continue to benefit from skilled PT in the acute setting.  Will continue to follow during acute stay and progress mobility as able.  She remains appropriate for inpatient rehab (<3 hours/day) at d/c.    If plan is discharge home, recommend the following: A lot of help with walking and/or transfers;A lot of help with bathing/dressing/bathroom;Assistance with cooking/housework;Direct supervision/assist for medications management;Direct supervision/assist for financial management;Assist for transportation;Supervision due to cognitive status   Can travel by private vehicle     No  Equipment Recommendations  None recommended by PT (TBA)    Recommendations for Other Services       Precautions / Restrictions Precautions Precautions: Fall     Mobility  Bed Mobility Overal bed mobility: Needs Assistance Bed Mobility: Supine to Sit     Supine to sit: Max assist, HOB elevated, +2 for safety/equipment     General bed mobility comments: assist to initiate moving legs off EOB and assist to lift trunk and scoot hips to EOB    Transfers Overall transfer level: Needs assistance Equipment used: Rolling walker (2 wheels) Transfers: Sit to/from Stand Sit to Stand: +2 safety/equipment           General transfer comment: assist for anterior weight shift and for lifting from EOB    Ambulation/Gait Ambulation/Gait assistance: Min assist,  +2 safety/equipment Gait Distance (Feet): 30 Feet Assistive device: Rolling walker (2 wheels) Gait Pattern/deviations: Step-to pattern, Decreased stride length, Shuffle, Leaning posteriorly       General Gait Details: assist for balance, anterior weight shift, assist for walker manipulation and chair follow for safety   Stairs             Wheelchair Mobility     Tilt Bed    Modified Rankin (Stroke Patients Only) Modified Rankin (Stroke Patients Only) Pre-Morbid Rankin Score: Slight disability Modified Rankin: Moderately severe disability     Balance Overall balance assessment: Needs assistance Sitting-balance support: Feet supported Sitting balance-Leahy Scale: Fair Sitting balance - Comments: posterior lean initially, then able to progress to S in sitting Postural control: Posterior lean Standing balance support: Bilateral upper extremity supported, During functional activity Standing balance-Leahy Scale: Poor Standing balance comment: assist for anterior weight shift and for walker manipulation throughout                            Cognition Arousal: Alert Behavior During Therapy: Flat affect Overall Cognitive Status: No family/caregiver present to determine baseline cognitive functioning                                 General Comments: following some commands, though frustrated and would not let PT place purwick end of session        Exercises      General Comments General comments (skin integrity, edema, etc.): meal in front  of pt and she was not initiating, assisted with hand over hand for two bites of strawberry ice cream, with cup placed in her hand would use a straw to drink grape juice, assist for hand over hand one bite of eggs and sausage then pt not opening mouth when offerred,  Did drink several sips of chocolate Ensure when placed in her hand and assisted with straw.  Tried to replace purewick in chair, but pt pusing away  and not allowing me to, NT aware.      Pertinent Vitals/Pain Pain Assessment Faces Pain Scale: No hurt    Home Living                          Prior Function            PT Goals (current goals can now be found in the care plan section) Progress towards PT goals: Progressing toward goals    Frequency    Min 1X/week      PT Plan      Co-evaluation              AM-PAC PT 6 Clicks Mobility   Outcome Measure  Help needed turning from your back to your side while in a flat bed without using bedrails?: A Lot Help needed moving from lying on your back to sitting on the side of a flat bed without using bedrails?: A Lot Help needed moving to and from a bed to a chair (including a wheelchair)?: A Lot Help needed standing up from a chair using your arms (e.g., wheelchair or bedside chair)?: A Lot Help needed to walk in hospital room?: A Lot Help needed climbing 3-5 steps with a railing? : Total 6 Click Score: 11    End of Session Equipment Utilized During Treatment: Gait belt Activity Tolerance: Patient tolerated treatment well Patient left: in chair;with call bell/phone within reach;with chair alarm set   PT Visit Diagnosis: Muscle weakness (generalized) (M62.81);Other symptoms and signs involving the nervous system (R29.898);Other abnormalities of gait and mobility (R26.89)     Time: 8984-8954 PT Time Calculation (min) (ACUTE ONLY): 30 min  Charges:    $Gait Training: 8-22 mins $Therapeutic Activity: 8-22 mins PT General Charges $$ ACUTE PT VISIT: 1 Visit                     Micheline Portal, PT Acute Rehabilitation Services [phone] 07/17/2023    [name] 07/17/2023, 3:34 PM

## 2023-07-17 NOTE — Progress Notes (Signed)
 Speech Language Pathology Treatment: Dysphagia  Patient Details Name: Tanya Gray MRN: [id number] DOB: [birth year] Today's Date: 07/17/2023 Time: 8952-8942 SLP Time Calculation (min) (ACUTE ONLY): 10 min  Assessment / Plan / Recommendation Clinical Impression  Pt was seated upright in her chair upon SLP arrival, sleeping but waking up to stimulation. Oral intake remains limited by cognition. With SLP she would only take a small amount of thin liquids and purees. It looks like she had eaten some of the chopped food earlier though. Although she took very little with SLP, she had no overt coughing during intake (even at baseline, as has been observed on other days). Will leave on current diet, still recommending full supervision but assisting pt so that she may self-feed as much as possible.   HPI HPI: Pt is a 78 yo admitted 2/2 from SNF with AMS, fever, cough. Flu A+. CXR and CT Head without acute findings. MRI pending. PMH includes: dementia (very talkative at baseline, feeds self, ambulatory), heartburn, esophageal stricture, HTN, PUD, CHF      SLP Plan  Continue with current plan of care      Recommendations for follow up therapy are one component of a multi-disciplinary discharge planning process, led by the attending physician.  Recommendations may be updated based on patient status, additional functional criteria and insurance authorization.    Recommendations  Diet recommendations: Dysphagia 2 (fine chop);Thin liquid Liquids provided via: Cup;Straw Medication Administration: Crushed with puree Supervision: Staff to assist with self feeding;Full supervision/cueing for compensatory strategies Compensations: Minimize environmental distractions;Slow rate;Small sips/bites Postural Changes and/or Swallow Maneuvers: Seated upright 90 degrees;Upright 30-60 min after meal                  Oral care BID   Frequent or constant Supervision/Assistance Dysphagia, unspecified  (R13.10)     Continue with current plan of care     Tanya Gray., M.A. CCC-SLP Acute Rehabilitation Services Office [phone]  Secure chat preferred   07/17/2023, 11:53 AM

## 2023-07-17 NOTE — TOC Progression Note (Addendum)
 Transition of Care [facility]) - Progression Note    Patient Details  Name: Tanya Gray MRN: [id number] Date of Birth: 08-17-1945  Transition of [facility] CM/SW Contact  Jose Alleyne A Retaj Hilbun, LCSW Phone Number: 07/17/2023, 1:23 PM  Clinical Narrative:      Update 1330 CSW received call back from pt's relative, Glenys, she stated that she was ok with [facility] for placement and updated her that CSW had not gotten decision back from [facility] regarding referral.  CSW reached out to [facility], bed available tomorrow. CSW started insurance authorization, status approved.   Shara PI#[id number]   Approval Dates: 2/6-2/8   Estimated DC tomorrow to [facility].  Update 07/18/23 1112 CSW contacted pt's relative, Tanya, regarding decision for SNF. There was no answer, CSW let VM with contact information to reach back out to CSW.    Update 1720 CSW reached out to pt's relative to get an answer on placement for SNF. There was no answer and CSW was unable to leave VM. CSW to reach back out tomorrow regarding decision and start insurance authorization.    CSW reached out to [facility] of [place] and left vm with [place] regarding update on decision for placement. Pt is medically stable, CSW to start insurance authorization when bed decision is finalized. CSW to reach out to family regarding bed decision for placement.    TOC will continue to follow.        Expected Discharge Plan and Services                                               Social Determinants of Health (SDOH) Interventions SDOH Screenings   Food Insecurity: Patient Unable To Answer (07/15/2023)  Housing: Patient Unable To Answer (07/15/2023)  Transportation Needs: Patient Unable To Answer (07/15/2023)  Utilities: Patient Unable To Answer (07/15/2023)  Social Connections: Unknown (07/15/2023)  Tobacco Use: Medium Risk (09/27/2022)    Readmission Risk Interventions     No data to display

## 2023-07-18 DIAGNOSIS — G9340 Encephalopathy, unspecified: Secondary | ICD-10-CM | POA: Diagnosis not present

## 2023-07-18 DIAGNOSIS — J101 Influenza due to other identified influenza virus with other respiratory manifestations: Secondary | ICD-10-CM | POA: Diagnosis not present

## 2023-07-18 LAB — COMPREHENSIVE METABOLIC PANEL
ALT: 19 U/L (ref 0–44)
AST: 36 U/L (ref 15–41)
Albumin: 2.9 g/dL — ABNORMAL LOW (ref 3.5–5.0)
Alkaline Phosphatase: 49 U/L (ref 38–126)
Anion gap: 10 (ref 5–15)
BUN: 13 mg/dL (ref 8–23)
CO2: 28 mmol/L (ref 22–32)
Calcium: 8.1 mg/dL — ABNORMAL LOW (ref 8.9–10.3)
Chloride: 105 mmol/L (ref 98–111)
Creatinine, Ser: 0.89 mg/dL (ref 0.44–1.00)
GFR, Estimated: >60 mL/min (ref >60)
Glucose, Bld: 96 mg/dL (ref 70–99)
Potassium: 3.5 mmol/L (ref 3.5–5.1)
Sodium: 143 mmol/L (ref 135–145)
Total Bilirubin: 0.4 mg/dL (ref 0.0–1.2)
Total Protein: 5.6 g/dL — ABNORMAL LOW (ref 6.5–8.1)

## 2023-07-18 LAB — CBC
HCT: 41.5 % (ref 36.0–46.0)
Hemoglobin: 13.3 g/dL (ref 12.0–15.0)
MCH: 30.2 pg (ref 26.0–34.0)
MCHC: 32 g/dL (ref 30.0–36.0)
MCV: 94.3 fL (ref 80.0–100.0)
Platelets: 173 10*3/uL (ref 150–400)
RBC: 4.4 MIL/uL (ref 3.87–5.11)
RDW: 13.7 % (ref 11.5–15.5)
WBC: 3.2 10*3/uL — ABNORMAL LOW (ref 4.0–10.5)
nRBC: 0 % (ref 0.0–0.2)

## 2023-07-18 LAB — MAGNESIUM: Magnesium: 1.8 mg/dL (ref 1.7–2.4)

## 2023-07-18 MED ORDER — ENSURE ENLIVE PO LIQD
237.0000 mL | Freq: Two times a day (BID) | ORAL | Status: DC
  Administered 2023-07-19: 237 mL via ORAL

## 2023-07-18 MED ORDER — SENNOSIDES-DOCUSATE SODIUM 8.6-50 MG PO TABS: 1.0000 | ORAL_TABLET | Freq: Every evening | ORAL | Status: DC | PRN

## 2023-07-18 MED ORDER — MAGNESIUM SULFATE 2 GM/50ML IV SOLN
2.0000 g | Freq: Once | INTRAVENOUS | Status: AC
  Administered 2023-07-18: 2 g via INTRAVENOUS
  Filled 2023-07-18: qty 50

## 2023-07-18 NOTE — Progress Notes (Signed)
 HD#3 SUBJECTIVE:  Patient Summary: Tanya Gray is a 78 y.o. with a HFpEF, CAD, HTN, PUD, and Dementia with behavioral disturbance who presented with an acute change in mental status yesterday admitted for acute encephalopathy.   Overnight Events: NAEO  Interim History:  Had breakfast this morning. Feels at her baseline.   OBJECTIVE:  Vital Signs: Vitals:   07/18/23 0500 07/18/23 0512 07/18/23 0514 07/18/23 0846  BP:  (!) 140/65  123/81  Pulse:  83 83 81  Resp:  18  17  Temp:  99.5 F (37.5 C)  97.8 F (36.6 C)  TempSrc:  Oral  Oral  SpO2:   93% 91%  Weight: 64 kg     Height:       Supplemental O2: Room Air SpO2: 91 %  Filed Weights   07/16/23 0356 07/17/23 0500 07/18/23 0500  Weight: 65.6 kg 63.8 kg 64 kg     Intake/Output Summary (Last 24 hours) at 07/18/2023 1312 Last data filed at 07/18/2023 1240 Gross per 24 hour  Intake 118 ml  Output 50 ml  Net 68 ml    Net IO Since Admission: -132 mL [07/18/23 1312]  Physical Exam: Physical Exam Constitutional:      General: She is not in acute distress.    Appearance: She is not ill-appearing or toxic-appearing.  HENT:     Mouth/Throat:     Mouth: Mucous membranes are dry.  Eyes:     Conjunctiva/sclera: Conjunctivae normal.  Cardiovascular:     Rate and Rhythm: Normal rate and regular rhythm.     Heart sounds: Murmur heard.     No gallop.  Pulmonary:     Effort: No respiratory distress.     Breath sounds: No wheezing, rhonchi or rales.  Abdominal:     General: Abdomen is flat. There is no distension.     Palpations: Abdomen is soft.     Tenderness: There is no abdominal tenderness. There is no guarding.     Hernia: No hernia is present.  Musculoskeletal:     Cervical back: No rigidity or tenderness.     Right lower leg: No edema.     Left lower leg: No edema.  Lymphadenopathy:     Cervical: No cervical adenopathy.  Skin:    General: Skin is warm and dry.     Capillary Refill: Capillary refill  takes less than 2 seconds.     Findings: No bruising.  Neurological:     Mental Status: She is disoriented.     Comments: Alerta and oriented to self. Not oriented to place.    Patient Lines/Drains/Airways Status     Active Line/Drains/Airways     Name Placement date Placement time Site Days   Peripheral IV 07/14/23 20 G Distal;Left;Posterior Forearm 07/14/23  --  Forearm  1            ASSESSMENT/PLAN:  Assessment: Principal Problem:   Acute encephalopathy Active Problems:   Influenza A   Hypoglycemia   History of dementia   Secondary hypertension   Plan:  Tanya Gray is a 78 y.o. with a PMH of HLD, HTN, Aortic valve disorder, Dementia, coming in with abrupt onset AMS admitted for acute encephalopathy.    Acute Metabolic Encephalopathy  Influenza A Dehydration Ate more from her plate today. More awake. Thinks she is at an apartment. Still somewhat disoriented, expected with someone with dementia who has recently been moved to another place. Ate breakfast on her own. Lungs have  ronchi and she still has coughing but now expectorating. SNF placement approved for tomorrow.  -Encourage PO intake -telemetry  -Xarelto  for VTE  -cw oseltamivir   -Mucinex   -delirum precautions    Chronic HFpEF  HTN Echo with LV EF 60-55% poor quality to assess ventricular wall function. She has severe concentric left ventricular hypertrophy. 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 Not on furosemide at home. HR has been WNL. Continue holding metoprolol  -cw Benazepril  40mg  daily  -Monitor K as she was on K supplementation   Dementia with behavioral disturbances Refused meds yesterday. Will restart Olanzapine .  -cw Trazodone  50mg  at bedtime, Meltonin 3mg  nightly and Buspirone  7.5mg  BID  -continue holding Lorazepam  0.5 mg PRN for showers.  -re-start Olanzapine  2.5mg  at bedtime   Aortic Aneurysm  Found incidentally due to murmur. Echo showing an ascending  aortic aneurysm measuring 4.1cm. Will need follow up in one year.   Urinary Incontinence  On purewick. Has urinary incontinence at baseline per family. -Pure wick  -KUB to assess for constipation   -Senna PRN   HLD  Can restart pravastatin  80mg  daily, ASA 81mg  daily    Best Practice: Diet: Full liquid IVF: Fluids: 1L LR VTE: rivaroxaban  (XARELTO ) tablet 10 mg Start: 07/16/23 1100 Place and maintain sequential compression device Start: 07/14/23 1858 Code: Full Dispo: Tomorrow to SNF  Signature: [facility]  Internal Medicine Resident, PGY-1 [facility]  Pager: [phone] 1:12 PM, 07/18/2023   Please contact the on call pager after 5 pm and on weekends at [phone].

## 2023-07-18 NOTE — Plan of Care (Signed)

## 2023-07-18 NOTE — Progress Notes (Signed)
 Occupational Therapy Treatment Patient Details Name: Tanya Gray MRN: [id number] DOB: 04-Mar-1946 Today's Date: 07/18/2023   History of present illness 78 y.o. female who presented 2/2 with an acute change in mental status yesterday admitted for acute encephalopathy.  PMHx:  HFpEF, CAD, HTN, PUD, and Dementia with behavioral disturbance.   OT comments  Pt pleasantly confused, c/o no pain or discomfort. Pt significantly soiled upon entry, thin stool, RN assisted with bed level bathing, increased time needed. Pt able to follow simple commands inconsistently. Pt requires max A for bed mobility, STS mod A x2 with significant posterior lean, unable to correct, Pt pulls up on RW. Pt assisted back to bed. Pt would benefit from continued skilled OT to maximize participation, DC to postacute rehab <3hrs/day appropriate.       If plan is discharge home, recommend the following:  Two people to help with walking and/or transfers;A lot of help with bathing/dressing/bathroom;Assistance with cooking/housework;Assist for transportation;Help with stairs or ramp for entrance   Equipment Recommendations  None recommended by OT    Recommendations for Other Services      Precautions / Restrictions Precautions Precautions: Fall Restrictions Weight Bearing Restrictions Per Provider Order: No       Mobility Bed Mobility Overal bed mobility: Needs Assistance Bed Mobility: Supine to Sit, Sit to Supine     Supine to sit: Max assist, HOB elevated Sit to supine: Max assist   General bed mobility comments: max A in/out of bed, max verbal/tactile cues    Transfers Overall transfer level: Needs assistance Equipment used: Rolling walker (2 wheels) Transfers: Sit to/from Stand Sit to Stand: Mod assist, +2 physical assistance, +2 safety/equipment           General transfer comment: mod A for STS using RW, posterior lean, not able to correct     Balance Overall balance assessment: Needs  assistance Sitting-balance support: No upper extremity supported, Feet supported Sitting balance-Leahy Scale: Fair Sitting balance - Comments: has posterior lean at times, CGA-min A to correct Postural control: Posterior lean Standing balance support: Bilateral upper extremity supported, During functional activity, Reliant on assistive device for balance Standing balance-Leahy Scale: Poor Standing balance comment: significant posterior lean, requires RW and therapist support despite having decent BLE strength                           ADL either performed or assessed with clinical judgement   ADL Overall ADL's : Needs assistance/impaired         Upper Body Bathing: Maximal assistance;Bed level   Lower Body Bathing: Maximal assistance;Bed level   Upper Body Dressing : Maximal assistance;Bed level   Lower Body Dressing: Total assistance;Bed level       Toileting- Clothing Manipulation and Hygiene: Total assistance;Bed level         General ADL Comments: Pt max-total A at bed level for bathing, dressing, toileting hygiene.    Extremity/Trunk Assessment Upper Extremity Assessment Upper Extremity Assessment: Generalized weakness            Vision       Perception     Praxis      Cognition Arousal: Alert Behavior During Therapy: Flat affect Overall Cognitive Status: No family/caregiver present to determine baseline cognitive functioning                                 General Comments: follows  commands inconsistently, able to respond appropriately <50% of time, displays significant confusion.        Exercises      Shoulder Instructions       General Comments      Pertinent Vitals/ Pain       Pain Assessment Pain Assessment: No/denies pain  Home Living                                          Prior Functioning/Environment              Frequency  Min 1X/week        Progress Toward Goals  OT  Goals(current goals can now be found in the care plan section)  Progress towards OT goals: Progressing toward goals  Acute Rehab OT Goals Patient Stated Goal: not able to participate in goal setting OT Goal Formulation: Patient unable to participate in goal setting Time For Goal Achievement: 07/30/23 Potential to Achieve Goals: Fair ADL Goals Pt Will Perform Upper Body Dressing: with set-up;sitting;with supervision Pt Will Perform Lower Body Dressing: with min assist;sit to/from stand Pt Will Transfer to Toilet: with contact guard assist;stand pivot transfer;bedside commode Pt Will Perform Toileting - Clothing Manipulation and hygiene: with min assist;sitting/lateral leans Additional ADL Goal #1: PT will be able to follow commands >50% of time to maximize participation with ADLs  Plan      Co-evaluation                 AM-PAC OT 6 Clicks Daily Activity     Outcome Measure   Help from another person eating meals?: A Little Help from another person taking care of personal grooming?: A Lot Help from another person toileting, which includes using toliet, bedpan, or urinal?: A Lot Help from another person bathing (including washing, rinsing, drying)?: A Lot Help from another person to put on and taking off regular upper body clothing?: A Lot Help from another person to put on and taking off regular lower body clothing?: Total 6 Click Score: 12    End of Session Equipment Utilized During Treatment: Gait belt;Rolling walker (2 wheels)  OT Visit Diagnosis: Unsteadiness on feet (R26.81);Other abnormalities of gait and mobility (R26.89);Muscle weakness (generalized) (M62.81);Other symptoms and signs involving cognitive function   Activity Tolerance Patient tolerated treatment well   Patient Left in bed;with call bell/phone within reach;with bed alarm set   Nurse Communication Mobility status        Time: 1305-1340 OT Time Calculation (min): 35 min  Charges: OT General  Charges $OT Visit: 1 Visit OT Treatments $Self Care/Home Management : 23-37 mins  [address], OTR/L   Tanya Gray 07/18/2023, 2:13 PM

## 2023-07-18 NOTE — Plan of Care (Signed)
  Problem: Clinical Measurements: Goal: Respiratory complications will improve Outcome: Progressing Goal: Cardiovascular complication will be avoided Outcome: Progressing   Problem: Coping: Goal: Level of anxiety will decrease Outcome: Progressing   Problem: Elimination: Goal: Will not experience complications related to urinary retention Outcome: Progressing   Problem: Pain Managment: Goal: General experience of comfort will improve and/or be controlled Outcome: Progressing

## 2023-07-19 DIAGNOSIS — G9340 Encephalopathy, unspecified: Secondary | ICD-10-CM | POA: Diagnosis not present

## 2023-07-19 MED ORDER — OSELTAMIVIR PHOSPHATE 30 MG PO CAPS: 30.0000 mg | ORAL_CAPSULE | Freq: Every day | ORAL | Status: AC

## 2023-07-19 MED ORDER — ENSURE ENLIVE PO LIQD: 237.0000 mL | Freq: Two times a day (BID) | ORAL | Status: AC

## 2023-07-19 MED ORDER — ACETAMINOPHEN 500 MG PO TABS: 1000.0000 mg | ORAL_TABLET | Freq: Four times a day (QID) | ORAL | Status: DC | PRN

## 2023-07-19 MED ORDER — METOPROLOL SUCCINATE ER 25 MG PO TB24: 25.0000 mg | ORAL_TABLET | Freq: Every day | ORAL | Status: AC

## 2023-07-19 MED ORDER — ACETAMINOPHEN 650 MG RE SUPP: 650.0000 mg | Freq: Four times a day (QID) | RECTAL | Status: DC | PRN

## 2023-07-19 NOTE — Progress Notes (Signed)
 Called report to [place], LPN at [facility]. No questions at this time. Patient left via [facility].

## 2023-07-19 NOTE — Progress Notes (Signed)
 This nurse called report to [facility] at [phone]. Nurse was on break, he is to call this nurse back to receive report when he is ready.

## 2023-07-19 NOTE — TOC Transition Note (Signed)
 Transition of Care [facility]) - Discharge Note   Patient Details  Name: Tanya Gray MRN: [id number] Date of Birth: 13-May-1946  Transition of Care [facility]) CM/SW Contact:  Mialani Reicks A Ly Bacchi, LCSW Phone Number: 07/19/2023, 12:48 PM   Clinical Narrative:     Patient will DC to: [facility]  Anticipated DC date: 07/19/23  Family notified: [place]  Transport by: [facility]      Per MD patient ready for DC to [facility]. RN, patient, patient's family, and facility notified of DC. Discharge Summary and FL2 sent to facility. RN to call report prior to discharge (room 105p, report, [phone]). DC packet on chart. Ambulance transport requested for patient.     CSW will sign off for now as social work intervention is no longer needed. Please consult us  again if new needs arise.    Final next level of care: Skilled Nursing Facility Barriers to Discharge: Barriers Resolved   Patient Goals and CMS Choice            Discharge Placement              Patient chooses bed at: [facility] Patient to be transferred to facility by: [facility] Name of family member notified: [name] Patient and family notified of of transfer: 07/19/23  Discharge Plan and Services Additional resources added to the After Visit Summary for                                       Social Drivers of Health (SDOH) Interventions SDOH Screenings   Food Insecurity: Patient Unable To Answer (07/15/2023)  Housing: Patient Unable To Answer (07/15/2023)  Transportation Needs: Patient Unable To Answer (07/15/2023)  Utilities: Patient Unable To Answer (07/15/2023)  Social Connections: Unknown (07/15/2023)  Tobacco Use: Medium Risk (09/27/2022)     Readmission Risk Interventions     No data to display

## 2023-07-19 NOTE — Plan of Care (Signed)
  Problem: Nutrition: Goal: Adequate nutrition will be maintained Outcome: Progressing   Problem: Elimination: Goal: Will not experience complications related to bowel motility Outcome: Progressing Goal: Will not experience complications related to urinary retention Outcome: Progressing   Problem: Pain Managment: Goal: General experience of comfort will improve and/or be controlled Outcome: Progressing   Problem: Safety: Goal: Ability to remain free from injury will improve Outcome: Progressing

## 2023-07-19 NOTE — Discharge Summary (Addendum)
 Name: Tanya Gray MRN: [id number] DOB: 06-24-1945 78 y.o. PCP: Pcp, No  Date of Admission: 07/14/2023  1:24 PM Date of Discharge:  07/19/2023 Attending Physician: Dr. CHARLENA Eastern  DISCHARGE DIAGNOSIS:  Primary Problem: Acute encephalopathy   Hospital Problems: Principal Problem:   Acute encephalopathy Active Problems:   Influenza A   Hypoglycemia   History of dementia   Secondary hypertension    DISCHARGE MEDICATIONS:   Allergies as of 07/19/2023   No Known Allergies      Medication List     STOP taking these medications    cyanocobalamin 1000 MCG tablet Commonly known as: VITAMIN B12   potassium chloride  10 MEQ tablet Commonly known as: KLOR-CON        TAKE these medications    acetaminophen  500 MG tablet Commonly known as: TYLENOL  Take 2 tablets (1,000 mg total) by mouth every 8 (eight) hours as needed for mild pain (pain score 1-3) or headache. What changed:  medication strength how much to take   albuterol  108 (90 Base) MCG/ACT inhaler Commonly known as: VENTOLIN  HFA Inhale 2 puffs into the lungs every 4 (four) hours as needed for wheezing or shortness of breath.   aspirin  EC 81 MG tablet Take 81 mg by mouth daily. Swallow whole.   benazepril  40 MG tablet Commonly known as: LOTENSIN  TAKE ONE TABLET BY MOUTH DAILY   busPIRone  7.5 MG tablet Commonly known as: BUSPAR  Take 7.5 mg by mouth 2 (two) times daily.   feeding supplement Liqd Take 237 mLs by mouth 2 (two) times daily between meals.   LORazepam  0.5 MG tablet Commonly known as: ATIVAN  Take 0.5 mg by mouth daily as needed for anxiety (for showers/bathing purposes).   melatonin 3 MG Tabs tablet Take 3 mg by mouth at bedtime.   metoprolol  succinate 25 MG 24 hr tablet Commonly known as: TOPROL -XL Take 1 tablet (25 mg total) by mouth daily. Take with or immediately following a meal. What changed:  medication strength how much to take additional instructions   nitroGLYCERIN  0.4  MG SL tablet Commonly known as: NITROSTAT  Dissolve 1 tab under tongue as needed for chest pain. May repeat every 5 minutes x 2 doses. If no relief call 9-1-1   OLANZapine  2.5 MG tablet Commonly known as: ZYPREXA  Take 2.5 mg by mouth at bedtime.   oseltamivir  30 MG capsule Commonly known as: TAMIFLU  Take 1 capsule (30 mg total) by mouth daily for 2 days.   pravastatin  80 MG tablet Commonly known as: PRAVACHOL  TAKE ONE TABLET BY MOUTH DAILY   senna 8.6 MG Tabs tablet Commonly known as: SENOKOT Take 1 tablet (8.6 mg total) by mouth daily as needed for mild constipation.   traZODone  50 MG tablet Commonly known as: DESYREL  Take 50 mg by mouth at bedtime.   Vitamin B Complex Tabs Take 1 tablet by mouth daily. With folic acid        DISPOSITION AND FOLLOW-UP:  Tanya Gray was discharged from [facility] in Stable condition. At the hospital follow up visit please address:  Follow-up Recommendations: Consults: None Labs: Basic Metabolic Profile, Blood Sugar, and CBC Lipid panel Medications: Please make sure she is having adequate PO intake. Supportive care.   Follow-up Appointments:  Follow-up Information     [place], [place] .   Specialty: Assisted Living Facility Contact information: [address] [place] [address] [phone]                 HOSPITAL COURSE:  Patient Summary: Tanya Gray is a 78 y.o. with a PMH of HLD, HTN, Aortic valve disorder, Dementia, coming in with abrupt onset AMS admitted for acute encephalopathy.    Acute Encephalopathy  Influenza A Dehydration Patient presented from ALF for AMS. At baseline patient is very talkative, would be alert to self and place and sometimes recognize staff but not always. She was sitting on her chair, unable to speak and shaking. She was very difficult to arouse when she was evaluated by the admissions team. She tested positive for influenza, was wheezing, febrile  and tachypneic. WBC is 3.4, with mild lymphopenia and no increased neutrophils.  CXR showing interstitial infiltrates. The rest of her MIST work-up was largely unremarkable with the exception of some signs of dehydration in her labs. Sodium was mildly decreased at 134. AST mildly elevated at 44 ALT 23 T billi 0.8.CT was not concerning for a stroke, and follow up MRI was done because of her abrupt onset and sxs and it was also non-concerning for an acute stroke. She did not have any signs of tongue biting and is incontinent at baseline but no reports of incontinence when the episodes happened. EEG showed mild diffuse encephalopathy. She was on many central acting medications that were briefly held during her encepalopathic event but was able to resume them later. She had normal kidney function with a Cr 1.01 and BUN 11, K is 4.2. Glucose WNL at 91 with no AG. She was dry, with mild edema on the feet, no JVD. UA not concerning for UTI, chad hemoglobinuria with no RBC but CK was 541. TSH and B12 WNL. She slowly improved with LR boluses and supportive care.   -cw Oseltamivir  for one more day (last day 07/19/2023) -continue encouraging PO intake  -continue with supportive care, tylenol  PRN, Mucinex   -Delirium precautions    Chronic HFpEF  Last echo on record here was 12/2019 showed an EF of 65-70% and grade II diastolic dysfunction. Dry on exam but has a murmur and calcified aortic valve. Repeat echo on 07/16/2023 with LV EF 60-55% poor quality to assess ventricular wall function. She has severe concentric left ventricular hypertrophy. 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 Not on furosemide at home.   -cw Benazepril  40mg  daily  -Monitor K as she was on K supplementation  -Resume Metoprolol  25mg  daily and nitroglycerine at discharge    Aortic Aneurysm  Found incidentally due to murmur. Echo showing an ascending aortic aneurysm measuring 4.1cm. Will need follow up in one year.     Urinary Incontinence  On purewick. Most likely her baseline. KUB with moderate stool burden.   -Maintain dry  -Butt cream  Dementia with behavioral disturbances She is on Trazodone  50mg  at bedtime, Meltonin 3mg  nightly, Lorazepam  0.5 mg PRN for showers, Olanzapine  2.5mg  at bedtime (should hold this in dementia unless agitated due to increased risk of death), Buspirone  7.5mg  BID.  -Can resume the above    HLD  Consider following up with a Lipid panel OP. She is currently on pravastatin  80mg  daily, ASA 81mg  daily. HDL was last measured in 2020 was 226.       ----------------------------------------------------------------------------------------------------------------------  Pravastatin  80 mg Trazodone  50 mg at bedtime Metoprolol  succinate 25 mg daily Nitroglycerin  0.4, not recently given Melatonin 3 mg nightly Lorazepam  0.5 mg PRN for showers Nitroglycerine Olanzapine  2.5 mg at bedtime Albuterol  90 q4H PRN Benazepril  40 mg every morning Buspirone  7.5 mg BID - last one was this morning B12 - complex -  daily  ASA 81 daily Acetaminophen  PRN TID  Knee hurt last week  Code - Full Code Family information: POA GLENWOOD [place] [phone]    Sueellen Olive - med tech   DISCHARGE INSTRUCTIONS:   Discharge Instructions     (HEART FAILURE PATIENTS) Call MD:  Anytime you have any of the following symptoms: 1) 3 pound weight gain in 24 hours or 5 pounds in 1 week 2) shortness of breath, with or without a dry hacking cough 3) swelling in the hands, feet or stomach 4) if you have to sleep on extra pillows at night in order to breathe.   Complete by: As directed    Call MD for:  difficulty breathing, headache or visual disturbances   Complete by: As directed    Call MD for:  extreme fatigue   Complete by: As directed    Call MD for:  hives   Complete by: As directed    Call MD for:  persistant dizziness or light-headedness   Complete by: As directed    Call MD for:  persistant  nausea and vomiting   Complete by: As directed    Call MD for:  redness, tenderness, or signs of infection (pain, swelling, redness, odor or green/yellow discharge around incision site)   Complete by: As directed    Call MD for:  severe uncontrolled pain   Complete by: As directed    Call MD for:  temperature >100.4   Complete by: As directed    Diet - low sodium heart healthy   Complete by: As directed    Discharge instructions   Complete by: As directed    Ms. Kump was hospitalized because of an acute change in her mentation. Tested positive for influenza A and was dehydrated. A CT and MRI of her brain did not show changes concerning for a stroke, there was no evidence of a seizure, her laboratory tests indicated that she was dehydrated and improved after giving her some fluids. She obtained supportive care and now needs assist and prompting with feeding.  Per family, Ms. Rodas is usually talkative at her baseline, she will be alert to her name and date of birth. She would know where she is at. She recognizes her cousin Bascom but sometimes forgets. Per staff at her ALF she was independent with most of her ADLs.   Please encourage PO intake, ensure she is drinking water and feeding. May need assistance initially as she works with PT to get stronger.   Contact information for cousin:   Weston Bascom (Relative) [phone]  ALF confirmed at admission that patient remains Full code.   Sincerely,  Cheron Mallard, MD  PGY-1   Increase activity slowly   Complete by: As directed        SUBJECTIVE:  Feeling great and eating much. Per nursing still needs some assist with feeding.   Discharge Vitals:   BP (!) 148/63 (BP Location: Right Arm)   Pulse 71   Temp 98.4 F (36.9 C) (Oral)   Resp 16   Ht 5' 3 (1.6 m)   Wt 63.6 kg   SpO2 92%   BMI 24.84 kg/m   OBJECTIVE:  Physical Exam Constitutional:      General: She is not in acute distress.    Appearance:  She is not ill-appearing or toxic-appearing.  Cardiovascular:     Rate and Rhythm: Normal rate and regular rhythm.     Heart sounds: Murmur heard.     No friction rub.  No gallop.  Pulmonary:     Effort: Pulmonary effort is normal. No respiratory distress.     Breath sounds: Wheezing present. No rhonchi or rales.  Abdominal:     General: Abdomen is flat. There is no distension.     Palpations: Abdomen is soft.     Tenderness: There is no abdominal tenderness. There is no guarding or rebound.  Musculoskeletal:     Right lower leg: No edema.     Left lower leg: No edema.  Skin:    General: Skin is warm and dry.  Neurological:     Mental Status: She is alert. Mental status is at baseline.     Pertinent Labs, Studies, and Procedures:     Latest Ref Rng & Units 07/18/2023    7:25 AM 07/16/2023    6:11 AM 07/15/2023    6:43 AM  CBC  WBC 4.0 - 10.5 K/uL 3.2  2.6  3.1   Hemoglobin 12.0 - 15.0 g/dL 86.6  86.7  87.6   Hematocrit 36.0 - 46.0 % 41.5  41.1  37.6   Platelets 150 - 400 K/uL 173  175  184        Latest Ref Rng & Units 07/18/2023    7:25 AM 07/17/2023    6:54 AM 07/16/2023    6:11 AM  CMP  Glucose 70 - 99 mg/dL 96  87  83   BUN 8 - 23 mg/dL 13  14  13    Creatinine 0.44 - 1.00 mg/dL 9.10  8.89  8.97   Sodium 135 - 145 mmol/L 143  140  140   Potassium 3.5 - 5.1 mmol/L 3.5  3.4  4.1   Chloride 98 - 111 mmol/L 105  101  105   CO2 22 - 32 mmol/L 28  27  25    Calcium 8.9 - 10.3 mg/dL 8.1  8.4  8.3   Total Protein 6.5 - 8.1 g/dL 5.6  5.7  5.8   Total Bilirubin 0.0 - 1.2 mg/dL 0.4  0.5  0.9   Alkaline Phos 38 - 126 U/L 49  54  51   AST 15 - 41 U/L 36  40  50   ALT 0 - 44 U/L 19  20  21      DG Abd 1 View Result Date: 07/15/2023 CLINICAL DATA:  Constipation. EXAM: ABDOMEN - 1 VIEW COMPARISON:  CT 09/27/2022 FINDINGS: No bowel dilatation to suggest obstruction. Small to moderate volume of stool in the colon. Small volume of stool in the rectum. Multiple calcifications in the pelvis  corresponding to phleboliths on CT. There are vascular calcifications. No radiopaque calculi. No evidence of free air in the supine views. Lung bases are clear. IMPRESSION: Small to moderate volume of stool in the colon. No bowel obstruction. Electronically Signed   By: Andrea Gasman M.D.   On: 07/15/2023 16:45   MR BRAIN WO CONTRAST Result Date: 07/15/2023 CLINICAL DATA:  Neuro deficit with stroke suspected. Acute encephalopathy. EXAM: MRI HEAD WITHOUT CONTRAST TECHNIQUE: Multiplanar, multiecho pulse sequences of the brain and surrounding structures were obtained without intravenous contrast. COMPARISON:  Head CT from yesterday FINDINGS: Brain: No acute infarction, hemorrhage, hydrocephalus, extra-axial collection or mass lesion. Generalized brain atrophy. 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 Mild for age ischemic gliosis in the deep cerebral white matter. No abnormal mineralization. Vascular: Major flow voids are preserved Skull and upper cervical spine: No focal marrow lesion. Sinuses/Orbits: Partial bilateral mastoid opacification with negative nasopharynx. IMPRESSION: 1.  No acute or reversible finding. 2. Atrophy and small vessel ischemia. 3. Progressively motion degraded exam. Electronically Signed   By: Dorn Roulette M.D.   On: 07/15/2023 13:10   EEG adult Result Date: 07/15/2023 Shelton Arlin KIDD, MD     07/15/2023  9:37 AM Patient Name: Ashonte W Raso MRN: [id number] Epilepsy Attending: Arlin KIDD Shelton Referring Physician/Provider: Elnora Ip, MD Date: 07/15/2023 Duration: 22.38 mins Patient history: 78yo F with ams. EEG to evaluate for seizure Level of alertness: Awake AEDs during EEG study: None Technical aspects: This EEG study was done with scalp electrodes positioned according to the 10-20 International system of electrode placement. Electrical activity was reviewed with band pass filter of 1-70Hz , sensitivity of 7 uV/mm, display speed of 9mm/sec with a 60Hz   notched filter applied as appropriate. EEG data were recorded continuously and digitally stored.  Video monitoring was available and reviewed as appropriate. Description: The posterior dominant rhythm consists of 7.5 Hz activity of moderate voltage (25-35 uV) seen predominantly in posterior head regions, symmetric and reactive to eye opening and eye closing. EEG showed continuous generalized predominantly 5 to 7 Hz theta slowing admixed with intermittent 2-3Hz  delta slowing. Hyperventilation and photic stimulation were not performed.   ABNORMALITY - Continuous slow, generalized IMPRESSION: This study is suggestive of mild diffuse encephalopathy. No seizures or epileptiform discharges were seen throughout the recording. Arlin KIDD Shelton   DG Chest Portable 1 View Result Date: 07/14/2023 CLINICAL DATA:  Altered mental status and fever.  Dementia. EXAM: PORTABLE CHEST 1 VIEW COMPARISON:  06/21/2012 FINDINGS: Cardiac enlargement. Lungs are clear. No pleural effusion or pneumothorax. Mediastinal contours appear intact. Calcification of the aorta. IMPRESSION: No active disease. Electronically Signed   By: Elsie Gravely M.D.   On: 07/14/2023 17:19   CT HEAD WO CONTRAST ( ) Result Date: 07/14/2023 CLINICAL DATA:  Mental status changes EXAM: CT HEAD WITHOUT CONTRAST TECHNIQUE: Contiguous axial images were obtained from the base of the skull through the vertex without intravenous contrast. RADIATION DOSE REDUCTION: This exam was performed according to the departmental dose-optimization program which includes automated exposure control, adjustment of the mA and/or kV according to patient size and/or use of iterative reconstruction technique. COMPARISON:  04/07/2004 FINDINGS: Brain: Cerebral and cerebellar atrophy. Mild low density in the periventricular white matter likely related to small vessel disease. No mass lesion, hemorrhage, hydrocephalus, acute infarct, intra-axial, or extra-axial fluid collection. Vascular:  No hyperdense vessel or unexpected calcification. Intracranial atherosclerosis. Skull: Normal Sinuses/Orbits: Normal imaged portions of the orbits and globes. Hypoplastic frontal sinuses. Left mastoid air cells are partially opacified. Other: None. IMPRESSION: 1.  No acute intracranial abnormality. 2.  Cerebral atrophy and small vessel ischemic change. 3. Left mastoid effusion. Electronically Signed   By: Rockey Kilts M.D.   On: 07/14/2023 16:43     Signed: Cheron Mallard, MD Internal Medicine Resident, PGY-1 [facility]  Pager: [phone] 11:39 AM, 07/19/2023

## 2023-07-31 NOTE — Progress Notes (Signed)
Coding clarification.  Metabolic Encephalopathy due to influenza   Gust Rung, DO
